# Patient Record
Sex: Male | Born: 1945
Health system: Southern US, Community
[De-identification: ages and names within clinical notes are randomized; demographics above are authoritative.]

## PROBLEM LIST (undated history)

## (undated) DIAGNOSIS — E119 Type 2 diabetes mellitus without complications: Secondary | ICD-10-CM

## (undated) DIAGNOSIS — M1990 Unspecified osteoarthritis, unspecified site: Secondary | ICD-10-CM

## (undated) DIAGNOSIS — I10 Essential (primary) hypertension: Secondary | ICD-10-CM

## (undated) DIAGNOSIS — Z87442 Personal history of urinary calculi: Secondary | ICD-10-CM

## (undated) DIAGNOSIS — K219 Gastro-esophageal reflux disease without esophagitis: Secondary | ICD-10-CM

## (undated) HISTORY — PX: TONSILLECTOMY: SUR1361

## (undated) HISTORY — PX: CHOLECYSTECTOMY: SHX55

## (undated) HISTORY — PX: BACK SURGERY: SHX140

## (undated) HISTORY — PX: JOINT REPLACEMENT: SHX530

## (undated) HISTORY — PX: EYE SURGERY: SHX253

---

## 2004-04-30 DIAGNOSIS — K219 Gastro-esophageal reflux disease without esophagitis: Secondary | ICD-10-CM | POA: Insufficient documentation

## 2005-10-24 DIAGNOSIS — D126 Benign neoplasm of colon, unspecified: Secondary | ICD-10-CM | POA: Insufficient documentation

## 2011-06-25 DIAGNOSIS — E119 Type 2 diabetes mellitus without complications: Secondary | ICD-10-CM | POA: Diagnosis not present

## 2011-06-25 DIAGNOSIS — M2040 Other hammer toe(s) (acquired), unspecified foot: Secondary | ICD-10-CM | POA: Diagnosis not present

## 2011-06-25 DIAGNOSIS — M2010 Hallux valgus (acquired), unspecified foot: Secondary | ICD-10-CM | POA: Diagnosis not present

## 2011-06-25 DIAGNOSIS — L600 Ingrowing nail: Secondary | ICD-10-CM | POA: Diagnosis not present

## 2011-06-25 DIAGNOSIS — M79609 Pain in unspecified limb: Secondary | ICD-10-CM | POA: Diagnosis not present

## 2011-07-09 DIAGNOSIS — E119 Type 2 diabetes mellitus without complications: Secondary | ICD-10-CM | POA: Diagnosis not present

## 2011-07-09 DIAGNOSIS — M7730 Calcaneal spur, unspecified foot: Secondary | ICD-10-CM | POA: Diagnosis not present

## 2011-07-09 DIAGNOSIS — M2010 Hallux valgus (acquired), unspecified foot: Secondary | ICD-10-CM | POA: Diagnosis not present

## 2011-07-09 DIAGNOSIS — M2040 Other hammer toe(s) (acquired), unspecified foot: Secondary | ICD-10-CM | POA: Diagnosis not present

## 2012-08-04 DIAGNOSIS — R498 Other voice and resonance disorders: Secondary | ICD-10-CM | POA: Diagnosis not present

## 2012-08-04 DIAGNOSIS — J370 Chronic laryngitis: Secondary | ICD-10-CM | POA: Diagnosis not present

## 2012-12-22 DIAGNOSIS — M19049 Primary osteoarthritis, unspecified hand: Secondary | ICD-10-CM | POA: Diagnosis not present

## 2012-12-22 DIAGNOSIS — M545 Low back pain: Secondary | ICD-10-CM | POA: Diagnosis not present

## 2012-12-22 DIAGNOSIS — M533 Sacrococcygeal disorders, not elsewhere classified: Secondary | ICD-10-CM | POA: Diagnosis not present

## 2012-12-22 DIAGNOSIS — Z0000 Encounter for general adult medical examination without abnormal findings: Secondary | ICD-10-CM | POA: Diagnosis not present

## 2012-12-25 DIAGNOSIS — M6281 Muscle weakness (generalized): Secondary | ICD-10-CM | POA: Diagnosis not present

## 2012-12-25 DIAGNOSIS — M545 Low back pain: Secondary | ICD-10-CM | POA: Diagnosis not present

## 2012-12-29 DIAGNOSIS — M6281 Muscle weakness (generalized): Secondary | ICD-10-CM | POA: Diagnosis not present

## 2012-12-29 DIAGNOSIS — M545 Low back pain: Secondary | ICD-10-CM | POA: Diagnosis not present

## 2012-12-31 DIAGNOSIS — M6281 Muscle weakness (generalized): Secondary | ICD-10-CM | POA: Diagnosis not present

## 2012-12-31 DIAGNOSIS — M545 Low back pain: Secondary | ICD-10-CM | POA: Diagnosis not present

## 2013-01-01 DIAGNOSIS — R42 Dizziness and giddiness: Secondary | ICD-10-CM | POA: Diagnosis not present

## 2013-01-01 DIAGNOSIS — R498 Other voice and resonance disorders: Secondary | ICD-10-CM | POA: Diagnosis not present

## 2013-01-06 DIAGNOSIS — M6281 Muscle weakness (generalized): Secondary | ICD-10-CM | POA: Diagnosis not present

## 2013-01-06 DIAGNOSIS — M545 Low back pain: Secondary | ICD-10-CM | POA: Diagnosis not present

## 2013-01-12 DIAGNOSIS — M545 Low back pain: Secondary | ICD-10-CM | POA: Diagnosis not present

## 2013-01-12 DIAGNOSIS — M6281 Muscle weakness (generalized): Secondary | ICD-10-CM | POA: Diagnosis not present

## 2013-01-15 DIAGNOSIS — Z006 Encounter for examination for normal comparison and control in clinical research program: Secondary | ICD-10-CM | POA: Diagnosis not present

## 2013-01-15 DIAGNOSIS — R937 Abnormal findings on diagnostic imaging of other parts of musculoskeletal system: Secondary | ICD-10-CM | POA: Diagnosis not present

## 2013-01-15 DIAGNOSIS — M533 Sacrococcygeal disorders, not elsewhere classified: Secondary | ICD-10-CM | POA: Diagnosis not present

## 2013-01-15 DIAGNOSIS — S20219A Contusion of unspecified front wall of thorax, initial encounter: Secondary | ICD-10-CM | POA: Diagnosis not present

## 2013-01-15 DIAGNOSIS — M545 Low back pain: Secondary | ICD-10-CM | POA: Diagnosis not present

## 2013-01-15 DIAGNOSIS — M5137 Other intervertebral disc degeneration, lumbosacral region: Secondary | ICD-10-CM | POA: Diagnosis not present

## 2013-01-27 DIAGNOSIS — M545 Low back pain: Secondary | ICD-10-CM | POA: Diagnosis not present

## 2013-01-27 DIAGNOSIS — M6281 Muscle weakness (generalized): Secondary | ICD-10-CM | POA: Diagnosis not present

## 2013-01-29 DIAGNOSIS — M545 Low back pain: Secondary | ICD-10-CM | POA: Diagnosis not present

## 2013-01-29 DIAGNOSIS — M6281 Muscle weakness (generalized): Secondary | ICD-10-CM | POA: Diagnosis not present

## 2013-02-02 DIAGNOSIS — M6281 Muscle weakness (generalized): Secondary | ICD-10-CM | POA: Diagnosis not present

## 2013-02-02 DIAGNOSIS — M545 Low back pain: Secondary | ICD-10-CM | POA: Diagnosis not present

## 2013-02-04 DIAGNOSIS — M6281 Muscle weakness (generalized): Secondary | ICD-10-CM | POA: Diagnosis not present

## 2013-02-04 DIAGNOSIS — M545 Low back pain: Secondary | ICD-10-CM | POA: Diagnosis not present

## 2013-02-09 DIAGNOSIS — M6281 Muscle weakness (generalized): Secondary | ICD-10-CM | POA: Diagnosis not present

## 2013-02-09 DIAGNOSIS — M545 Low back pain: Secondary | ICD-10-CM | POA: Diagnosis not present

## 2013-02-12 DIAGNOSIS — M545 Low back pain: Secondary | ICD-10-CM | POA: Diagnosis not present

## 2013-02-12 DIAGNOSIS — M6281 Muscle weakness (generalized): Secondary | ICD-10-CM | POA: Diagnosis not present

## 2013-02-16 DIAGNOSIS — M6281 Muscle weakness (generalized): Secondary | ICD-10-CM | POA: Diagnosis not present

## 2013-02-16 DIAGNOSIS — M545 Low back pain: Secondary | ICD-10-CM | POA: Diagnosis not present

## 2013-02-19 DIAGNOSIS — M545 Low back pain: Secondary | ICD-10-CM | POA: Diagnosis not present

## 2013-02-19 DIAGNOSIS — M6281 Muscle weakness (generalized): Secondary | ICD-10-CM | POA: Diagnosis not present

## 2013-03-10 DIAGNOSIS — M6281 Muscle weakness (generalized): Secondary | ICD-10-CM | POA: Diagnosis not present

## 2013-03-10 DIAGNOSIS — M545 Low back pain: Secondary | ICD-10-CM | POA: Diagnosis not present

## 2013-03-12 DIAGNOSIS — M6281 Muscle weakness (generalized): Secondary | ICD-10-CM | POA: Diagnosis not present

## 2013-03-12 DIAGNOSIS — M545 Low back pain: Secondary | ICD-10-CM | POA: Diagnosis not present

## 2013-03-16 DIAGNOSIS — M545 Low back pain: Secondary | ICD-10-CM | POA: Diagnosis not present

## 2013-03-16 DIAGNOSIS — M6281 Muscle weakness (generalized): Secondary | ICD-10-CM | POA: Diagnosis not present

## 2013-03-19 DIAGNOSIS — M6281 Muscle weakness (generalized): Secondary | ICD-10-CM | POA: Diagnosis not present

## 2013-03-19 DIAGNOSIS — M545 Low back pain: Secondary | ICD-10-CM | POA: Diagnosis not present

## 2013-03-23 DIAGNOSIS — M545 Low back pain: Secondary | ICD-10-CM | POA: Diagnosis not present

## 2013-03-23 DIAGNOSIS — M6281 Muscle weakness (generalized): Secondary | ICD-10-CM | POA: Diagnosis not present

## 2013-03-25 DIAGNOSIS — M6281 Muscle weakness (generalized): Secondary | ICD-10-CM | POA: Diagnosis not present

## 2013-03-25 DIAGNOSIS — M545 Low back pain: Secondary | ICD-10-CM | POA: Diagnosis not present

## 2013-03-31 DIAGNOSIS — M545 Low back pain: Secondary | ICD-10-CM | POA: Diagnosis not present

## 2013-03-31 DIAGNOSIS — M6281 Muscle weakness (generalized): Secondary | ICD-10-CM | POA: Diagnosis not present

## 2013-04-14 DIAGNOSIS — M1610 Unilateral primary osteoarthritis, unspecified hip: Secondary | ICD-10-CM | POA: Diagnosis not present

## 2013-04-21 DIAGNOSIS — M25559 Pain in unspecified hip: Secondary | ICD-10-CM | POA: Diagnosis not present

## 2013-05-26 DIAGNOSIS — M1610 Unilateral primary osteoarthritis, unspecified hip: Secondary | ICD-10-CM | POA: Diagnosis not present

## 2013-07-22 DIAGNOSIS — S0530XA Ocular laceration without prolapse or loss of intraocular tissue, unspecified eye, initial encounter: Secondary | ICD-10-CM | POA: Insufficient documentation

## 2013-07-22 DIAGNOSIS — S0590XA Unspecified injury of unspecified eye and orbit, initial encounter: Secondary | ICD-10-CM

## 2013-07-22 DIAGNOSIS — H25819 Combined forms of age-related cataract, unspecified eye: Secondary | ICD-10-CM

## 2013-07-22 DIAGNOSIS — H52219 Irregular astigmatism, unspecified eye: Secondary | ICD-10-CM

## 2013-07-22 HISTORY — DX: Combined forms of age-related cataract, unspecified eye: H25.819

## 2013-07-22 HISTORY — DX: Ocular laceration without prolapse or loss of intraocular tissue, unspecified eye, initial encounter: S05.30XA

## 2013-07-22 HISTORY — DX: Unspecified injury of unspecified eye and orbit, initial encounter: S05.90XA

## 2013-07-22 HISTORY — DX: Irregular astigmatism, unspecified eye: H52.219

## 2013-10-09 DIAGNOSIS — H1830 Unspecified corneal membrane change: Secondary | ICD-10-CM

## 2013-10-09 DIAGNOSIS — Z961 Presence of intraocular lens: Secondary | ICD-10-CM | POA: Insufficient documentation

## 2013-10-09 HISTORY — DX: Unspecified corneal membrane change: H18.30

## 2013-10-09 HISTORY — DX: Presence of intraocular lens: Z96.1

## 2014-02-08 DIAGNOSIS — M25551 Pain in right hip: Secondary | ICD-10-CM | POA: Diagnosis not present

## 2014-02-08 DIAGNOSIS — M1611 Unilateral primary osteoarthritis, right hip: Secondary | ICD-10-CM | POA: Diagnosis not present

## 2014-03-22 DIAGNOSIS — M25551 Pain in right hip: Secondary | ICD-10-CM | POA: Diagnosis not present

## 2014-03-22 DIAGNOSIS — M1611 Unilateral primary osteoarthritis, right hip: Secondary | ICD-10-CM | POA: Diagnosis not present

## 2014-08-10 DIAGNOSIS — M1611 Unilateral primary osteoarthritis, right hip: Secondary | ICD-10-CM | POA: Diagnosis not present

## 2014-08-11 DIAGNOSIS — Z79899 Other long term (current) drug therapy: Secondary | ICD-10-CM | POA: Diagnosis not present

## 2014-08-11 DIAGNOSIS — Z01810 Encounter for preprocedural cardiovascular examination: Secondary | ICD-10-CM | POA: Diagnosis not present

## 2014-08-11 DIAGNOSIS — M79609 Pain in unspecified limb: Secondary | ICD-10-CM | POA: Diagnosis not present

## 2014-08-11 DIAGNOSIS — Z0389 Encounter for observation for other suspected diseases and conditions ruled out: Secondary | ICD-10-CM | POA: Diagnosis not present

## 2014-08-11 DIAGNOSIS — Z01818 Encounter for other preprocedural examination: Secondary | ICD-10-CM | POA: Diagnosis not present

## 2014-08-30 DIAGNOSIS — M1611 Unilateral primary osteoarthritis, right hip: Secondary | ICD-10-CM | POA: Diagnosis not present

## 2014-09-01 DIAGNOSIS — I10 Essential (primary) hypertension: Secondary | ICD-10-CM | POA: Diagnosis not present

## 2014-09-01 DIAGNOSIS — Z471 Aftercare following joint replacement surgery: Secondary | ICD-10-CM | POA: Diagnosis not present

## 2014-09-01 DIAGNOSIS — M1611 Unilateral primary osteoarthritis, right hip: Secondary | ICD-10-CM | POA: Diagnosis not present

## 2014-09-01 DIAGNOSIS — E119 Type 2 diabetes mellitus without complications: Secondary | ICD-10-CM | POA: Diagnosis not present

## 2014-09-01 DIAGNOSIS — Z96641 Presence of right artificial hip joint: Secondary | ICD-10-CM | POA: Diagnosis not present

## 2014-09-02 DIAGNOSIS — I9589 Other hypotension: Secondary | ICD-10-CM | POA: Diagnosis not present

## 2014-09-02 DIAGNOSIS — R34 Anuria and oliguria: Secondary | ICD-10-CM | POA: Diagnosis not present

## 2014-09-02 DIAGNOSIS — N179 Acute kidney failure, unspecified: Secondary | ICD-10-CM | POA: Diagnosis not present

## 2014-09-02 DIAGNOSIS — D62 Acute posthemorrhagic anemia: Secondary | ICD-10-CM | POA: Diagnosis not present

## 2014-09-03 DIAGNOSIS — I9589 Other hypotension: Secondary | ICD-10-CM | POA: Diagnosis not present

## 2014-09-03 DIAGNOSIS — R34 Anuria and oliguria: Secondary | ICD-10-CM | POA: Diagnosis not present

## 2014-09-03 DIAGNOSIS — N179 Acute kidney failure, unspecified: Secondary | ICD-10-CM | POA: Diagnosis not present

## 2014-09-03 DIAGNOSIS — D62 Acute posthemorrhagic anemia: Secondary | ICD-10-CM | POA: Diagnosis not present

## 2014-09-05 DIAGNOSIS — Z96641 Presence of right artificial hip joint: Secondary | ICD-10-CM | POA: Diagnosis not present

## 2014-09-05 DIAGNOSIS — E119 Type 2 diabetes mellitus without complications: Secondary | ICD-10-CM | POA: Diagnosis not present

## 2014-09-05 DIAGNOSIS — Z471 Aftercare following joint replacement surgery: Secondary | ICD-10-CM | POA: Diagnosis not present

## 2014-09-07 DIAGNOSIS — E119 Type 2 diabetes mellitus without complications: Secondary | ICD-10-CM | POA: Diagnosis not present

## 2014-09-07 DIAGNOSIS — Z471 Aftercare following joint replacement surgery: Secondary | ICD-10-CM | POA: Diagnosis not present

## 2014-09-07 DIAGNOSIS — Z96641 Presence of right artificial hip joint: Secondary | ICD-10-CM | POA: Diagnosis not present

## 2014-09-08 DIAGNOSIS — E119 Type 2 diabetes mellitus without complications: Secondary | ICD-10-CM | POA: Diagnosis not present

## 2014-09-08 DIAGNOSIS — Z96641 Presence of right artificial hip joint: Secondary | ICD-10-CM | POA: Diagnosis not present

## 2014-09-08 DIAGNOSIS — Z471 Aftercare following joint replacement surgery: Secondary | ICD-10-CM | POA: Diagnosis not present

## 2014-09-10 DIAGNOSIS — Z96641 Presence of right artificial hip joint: Secondary | ICD-10-CM | POA: Diagnosis not present

## 2014-09-10 DIAGNOSIS — E119 Type 2 diabetes mellitus without complications: Secondary | ICD-10-CM | POA: Diagnosis not present

## 2014-09-10 DIAGNOSIS — Z471 Aftercare following joint replacement surgery: Secondary | ICD-10-CM | POA: Diagnosis not present

## 2014-09-12 DIAGNOSIS — E119 Type 2 diabetes mellitus without complications: Secondary | ICD-10-CM | POA: Diagnosis not present

## 2014-09-12 DIAGNOSIS — Z96641 Presence of right artificial hip joint: Secondary | ICD-10-CM | POA: Diagnosis not present

## 2014-09-12 DIAGNOSIS — Z471 Aftercare following joint replacement surgery: Secondary | ICD-10-CM | POA: Diagnosis not present

## 2014-10-15 DIAGNOSIS — Z96641 Presence of right artificial hip joint: Secondary | ICD-10-CM | POA: Diagnosis not present

## 2014-11-29 DIAGNOSIS — Z96641 Presence of right artificial hip joint: Secondary | ICD-10-CM | POA: Diagnosis not present

## 2014-12-03 DIAGNOSIS — Z96641 Presence of right artificial hip joint: Secondary | ICD-10-CM | POA: Diagnosis not present

## 2014-12-03 DIAGNOSIS — M6281 Muscle weakness (generalized): Secondary | ICD-10-CM | POA: Diagnosis not present

## 2014-12-03 DIAGNOSIS — R2689 Other abnormalities of gait and mobility: Secondary | ICD-10-CM | POA: Diagnosis not present

## 2014-12-03 DIAGNOSIS — M25551 Pain in right hip: Secondary | ICD-10-CM | POA: Diagnosis not present

## 2014-12-10 DIAGNOSIS — Z96641 Presence of right artificial hip joint: Secondary | ICD-10-CM | POA: Diagnosis not present

## 2014-12-10 DIAGNOSIS — R2689 Other abnormalities of gait and mobility: Secondary | ICD-10-CM | POA: Diagnosis not present

## 2014-12-10 DIAGNOSIS — M6281 Muscle weakness (generalized): Secondary | ICD-10-CM | POA: Diagnosis not present

## 2014-12-10 DIAGNOSIS — M25551 Pain in right hip: Secondary | ICD-10-CM | POA: Diagnosis not present

## 2014-12-14 DIAGNOSIS — R2689 Other abnormalities of gait and mobility: Secondary | ICD-10-CM | POA: Diagnosis not present

## 2014-12-14 DIAGNOSIS — M25551 Pain in right hip: Secondary | ICD-10-CM | POA: Diagnosis not present

## 2014-12-14 DIAGNOSIS — M6281 Muscle weakness (generalized): Secondary | ICD-10-CM | POA: Diagnosis not present

## 2014-12-14 DIAGNOSIS — Z96641 Presence of right artificial hip joint: Secondary | ICD-10-CM | POA: Diagnosis not present

## 2014-12-17 DIAGNOSIS — M25551 Pain in right hip: Secondary | ICD-10-CM | POA: Diagnosis not present

## 2014-12-17 DIAGNOSIS — R2689 Other abnormalities of gait and mobility: Secondary | ICD-10-CM | POA: Diagnosis not present

## 2014-12-17 DIAGNOSIS — M6281 Muscle weakness (generalized): Secondary | ICD-10-CM | POA: Diagnosis not present

## 2014-12-17 DIAGNOSIS — Z96641 Presence of right artificial hip joint: Secondary | ICD-10-CM | POA: Diagnosis not present

## 2014-12-21 DIAGNOSIS — R2689 Other abnormalities of gait and mobility: Secondary | ICD-10-CM | POA: Diagnosis not present

## 2014-12-21 DIAGNOSIS — M6281 Muscle weakness (generalized): Secondary | ICD-10-CM | POA: Diagnosis not present

## 2014-12-21 DIAGNOSIS — M25551 Pain in right hip: Secondary | ICD-10-CM | POA: Diagnosis not present

## 2014-12-21 DIAGNOSIS — Z96641 Presence of right artificial hip joint: Secondary | ICD-10-CM | POA: Diagnosis not present

## 2014-12-23 DIAGNOSIS — M25551 Pain in right hip: Secondary | ICD-10-CM | POA: Diagnosis not present

## 2014-12-23 DIAGNOSIS — R2689 Other abnormalities of gait and mobility: Secondary | ICD-10-CM | POA: Diagnosis not present

## 2014-12-23 DIAGNOSIS — Z96641 Presence of right artificial hip joint: Secondary | ICD-10-CM | POA: Diagnosis not present

## 2014-12-23 DIAGNOSIS — M6281 Muscle weakness (generalized): Secondary | ICD-10-CM | POA: Diagnosis not present

## 2014-12-28 DIAGNOSIS — M6281 Muscle weakness (generalized): Secondary | ICD-10-CM | POA: Diagnosis not present

## 2014-12-28 DIAGNOSIS — R2689 Other abnormalities of gait and mobility: Secondary | ICD-10-CM | POA: Diagnosis not present

## 2014-12-28 DIAGNOSIS — M25551 Pain in right hip: Secondary | ICD-10-CM | POA: Diagnosis not present

## 2014-12-30 DIAGNOSIS — Z96641 Presence of right artificial hip joint: Secondary | ICD-10-CM | POA: Diagnosis not present

## 2014-12-30 DIAGNOSIS — M6281 Muscle weakness (generalized): Secondary | ICD-10-CM | POA: Diagnosis not present

## 2014-12-30 DIAGNOSIS — R2689 Other abnormalities of gait and mobility: Secondary | ICD-10-CM | POA: Diagnosis not present

## 2014-12-30 DIAGNOSIS — M25551 Pain in right hip: Secondary | ICD-10-CM | POA: Diagnosis not present

## 2015-01-04 DIAGNOSIS — Z96641 Presence of right artificial hip joint: Secondary | ICD-10-CM | POA: Diagnosis not present

## 2015-01-04 DIAGNOSIS — R2689 Other abnormalities of gait and mobility: Secondary | ICD-10-CM | POA: Diagnosis not present

## 2015-01-04 DIAGNOSIS — M6281 Muscle weakness (generalized): Secondary | ICD-10-CM | POA: Diagnosis not present

## 2015-01-04 DIAGNOSIS — M25551 Pain in right hip: Secondary | ICD-10-CM | POA: Diagnosis not present

## 2015-01-07 DIAGNOSIS — M6281 Muscle weakness (generalized): Secondary | ICD-10-CM | POA: Diagnosis not present

## 2015-01-07 DIAGNOSIS — R2689 Other abnormalities of gait and mobility: Secondary | ICD-10-CM | POA: Diagnosis not present

## 2015-01-07 DIAGNOSIS — Z96641 Presence of right artificial hip joint: Secondary | ICD-10-CM | POA: Diagnosis not present

## 2015-01-07 DIAGNOSIS — M25551 Pain in right hip: Secondary | ICD-10-CM | POA: Diagnosis not present

## 2015-01-11 DIAGNOSIS — Z96641 Presence of right artificial hip joint: Secondary | ICD-10-CM | POA: Diagnosis not present

## 2015-01-11 DIAGNOSIS — R2689 Other abnormalities of gait and mobility: Secondary | ICD-10-CM | POA: Diagnosis not present

## 2015-01-11 DIAGNOSIS — M6281 Muscle weakness (generalized): Secondary | ICD-10-CM | POA: Diagnosis not present

## 2015-01-11 DIAGNOSIS — M25551 Pain in right hip: Secondary | ICD-10-CM | POA: Diagnosis not present

## 2015-01-13 DIAGNOSIS — M25551 Pain in right hip: Secondary | ICD-10-CM | POA: Diagnosis not present

## 2015-01-13 DIAGNOSIS — M6281 Muscle weakness (generalized): Secondary | ICD-10-CM | POA: Diagnosis not present

## 2015-01-13 DIAGNOSIS — Z96641 Presence of right artificial hip joint: Secondary | ICD-10-CM | POA: Diagnosis not present

## 2015-01-13 DIAGNOSIS — R2689 Other abnormalities of gait and mobility: Secondary | ICD-10-CM | POA: Diagnosis not present

## 2015-01-18 DIAGNOSIS — Z96641 Presence of right artificial hip joint: Secondary | ICD-10-CM | POA: Diagnosis not present

## 2015-01-18 DIAGNOSIS — M25551 Pain in right hip: Secondary | ICD-10-CM | POA: Diagnosis not present

## 2015-01-18 DIAGNOSIS — M6281 Muscle weakness (generalized): Secondary | ICD-10-CM | POA: Diagnosis not present

## 2015-01-18 DIAGNOSIS — R2689 Other abnormalities of gait and mobility: Secondary | ICD-10-CM | POA: Diagnosis not present

## 2015-01-20 DIAGNOSIS — Z96641 Presence of right artificial hip joint: Secondary | ICD-10-CM | POA: Diagnosis not present

## 2015-01-20 DIAGNOSIS — M6281 Muscle weakness (generalized): Secondary | ICD-10-CM | POA: Diagnosis not present

## 2015-01-20 DIAGNOSIS — M25551 Pain in right hip: Secondary | ICD-10-CM | POA: Diagnosis not present

## 2015-01-20 DIAGNOSIS — R2689 Other abnormalities of gait and mobility: Secondary | ICD-10-CM | POA: Diagnosis not present

## 2015-01-26 DIAGNOSIS — M25551 Pain in right hip: Secondary | ICD-10-CM | POA: Diagnosis not present

## 2015-01-26 DIAGNOSIS — M6281 Muscle weakness (generalized): Secondary | ICD-10-CM | POA: Diagnosis not present

## 2015-01-26 DIAGNOSIS — R2689 Other abnormalities of gait and mobility: Secondary | ICD-10-CM | POA: Diagnosis not present

## 2015-01-26 DIAGNOSIS — Z96641 Presence of right artificial hip joint: Secondary | ICD-10-CM | POA: Diagnosis not present

## 2015-01-28 DIAGNOSIS — M25551 Pain in right hip: Secondary | ICD-10-CM | POA: Diagnosis not present

## 2015-01-28 DIAGNOSIS — R2689 Other abnormalities of gait and mobility: Secondary | ICD-10-CM | POA: Diagnosis not present

## 2015-01-28 DIAGNOSIS — M6281 Muscle weakness (generalized): Secondary | ICD-10-CM | POA: Diagnosis not present

## 2015-01-28 DIAGNOSIS — Z96641 Presence of right artificial hip joint: Secondary | ICD-10-CM | POA: Diagnosis not present

## 2015-02-01 DIAGNOSIS — R2689 Other abnormalities of gait and mobility: Secondary | ICD-10-CM | POA: Diagnosis not present

## 2015-02-01 DIAGNOSIS — M6281 Muscle weakness (generalized): Secondary | ICD-10-CM | POA: Diagnosis not present

## 2015-02-01 DIAGNOSIS — Z96641 Presence of right artificial hip joint: Secondary | ICD-10-CM | POA: Diagnosis not present

## 2015-02-01 DIAGNOSIS — M25551 Pain in right hip: Secondary | ICD-10-CM | POA: Diagnosis not present

## 2015-02-03 DIAGNOSIS — Z96641 Presence of right artificial hip joint: Secondary | ICD-10-CM | POA: Diagnosis not present

## 2015-02-03 DIAGNOSIS — M1711 Unilateral primary osteoarthritis, right knee: Secondary | ICD-10-CM | POA: Diagnosis not present

## 2015-02-03 DIAGNOSIS — M6281 Muscle weakness (generalized): Secondary | ICD-10-CM | POA: Diagnosis not present

## 2015-02-03 DIAGNOSIS — M25551 Pain in right hip: Secondary | ICD-10-CM | POA: Diagnosis not present

## 2015-02-03 DIAGNOSIS — R2689 Other abnormalities of gait and mobility: Secondary | ICD-10-CM | POA: Diagnosis not present

## 2015-02-08 DIAGNOSIS — M25551 Pain in right hip: Secondary | ICD-10-CM | POA: Diagnosis not present

## 2015-02-08 DIAGNOSIS — Z96641 Presence of right artificial hip joint: Secondary | ICD-10-CM | POA: Diagnosis not present

## 2015-02-08 DIAGNOSIS — M6281 Muscle weakness (generalized): Secondary | ICD-10-CM | POA: Diagnosis not present

## 2015-02-08 DIAGNOSIS — R2689 Other abnormalities of gait and mobility: Secondary | ICD-10-CM | POA: Diagnosis not present

## 2015-02-10 DIAGNOSIS — M6281 Muscle weakness (generalized): Secondary | ICD-10-CM | POA: Diagnosis not present

## 2015-02-10 DIAGNOSIS — Z96641 Presence of right artificial hip joint: Secondary | ICD-10-CM | POA: Diagnosis not present

## 2015-02-10 DIAGNOSIS — R2689 Other abnormalities of gait and mobility: Secondary | ICD-10-CM | POA: Diagnosis not present

## 2015-02-10 DIAGNOSIS — M25551 Pain in right hip: Secondary | ICD-10-CM | POA: Diagnosis not present

## 2015-02-15 DIAGNOSIS — M6281 Muscle weakness (generalized): Secondary | ICD-10-CM | POA: Diagnosis not present

## 2015-02-15 DIAGNOSIS — M25551 Pain in right hip: Secondary | ICD-10-CM | POA: Diagnosis not present

## 2015-02-15 DIAGNOSIS — R2689 Other abnormalities of gait and mobility: Secondary | ICD-10-CM | POA: Diagnosis not present

## 2015-02-15 DIAGNOSIS — Z96641 Presence of right artificial hip joint: Secondary | ICD-10-CM | POA: Diagnosis not present

## 2015-02-17 DIAGNOSIS — M6281 Muscle weakness (generalized): Secondary | ICD-10-CM | POA: Diagnosis not present

## 2015-02-17 DIAGNOSIS — R2689 Other abnormalities of gait and mobility: Secondary | ICD-10-CM | POA: Diagnosis not present

## 2015-02-17 DIAGNOSIS — M25551 Pain in right hip: Secondary | ICD-10-CM | POA: Diagnosis not present

## 2015-02-17 DIAGNOSIS — Z96641 Presence of right artificial hip joint: Secondary | ICD-10-CM | POA: Diagnosis not present

## 2015-03-10 DIAGNOSIS — Z96641 Presence of right artificial hip joint: Secondary | ICD-10-CM | POA: Diagnosis not present

## 2015-04-12 DIAGNOSIS — M1711 Unilateral primary osteoarthritis, right knee: Secondary | ICD-10-CM | POA: Diagnosis not present

## 2015-04-18 DIAGNOSIS — M25561 Pain in right knee: Secondary | ICD-10-CM | POA: Diagnosis not present

## 2015-04-18 DIAGNOSIS — M5441 Lumbago with sciatica, right side: Secondary | ICD-10-CM | POA: Diagnosis not present

## 2015-04-27 DIAGNOSIS — M5441 Lumbago with sciatica, right side: Secondary | ICD-10-CM | POA: Diagnosis not present

## 2015-04-27 DIAGNOSIS — M25561 Pain in right knee: Secondary | ICD-10-CM | POA: Diagnosis not present

## 2015-05-03 DIAGNOSIS — M25561 Pain in right knee: Secondary | ICD-10-CM | POA: Diagnosis not present

## 2015-05-03 DIAGNOSIS — M5441 Lumbago with sciatica, right side: Secondary | ICD-10-CM | POA: Diagnosis not present

## 2015-05-06 DIAGNOSIS — M1711 Unilateral primary osteoarthritis, right knee: Secondary | ICD-10-CM | POA: Diagnosis not present

## 2015-05-11 DIAGNOSIS — M5441 Lumbago with sciatica, right side: Secondary | ICD-10-CM | POA: Diagnosis not present

## 2015-05-11 DIAGNOSIS — M25561 Pain in right knee: Secondary | ICD-10-CM | POA: Diagnosis not present

## 2015-05-16 DIAGNOSIS — M25561 Pain in right knee: Secondary | ICD-10-CM | POA: Diagnosis not present

## 2015-05-16 DIAGNOSIS — M5441 Lumbago with sciatica, right side: Secondary | ICD-10-CM | POA: Diagnosis not present

## 2015-05-30 DIAGNOSIS — H6120 Impacted cerumen, unspecified ear: Secondary | ICD-10-CM | POA: Diagnosis not present

## 2015-05-30 DIAGNOSIS — I10 Essential (primary) hypertension: Secondary | ICD-10-CM | POA: Diagnosis not present

## 2015-05-30 DIAGNOSIS — R490 Dysphonia: Secondary | ICD-10-CM | POA: Diagnosis not present

## 2015-06-21 DIAGNOSIS — M1711 Unilateral primary osteoarthritis, right knee: Secondary | ICD-10-CM | POA: Diagnosis not present

## 2015-07-04 DIAGNOSIS — M1711 Unilateral primary osteoarthritis, right knee: Secondary | ICD-10-CM | POA: Diagnosis not present

## 2015-07-08 DIAGNOSIS — Z01810 Encounter for preprocedural cardiovascular examination: Secondary | ICD-10-CM | POA: Diagnosis not present

## 2015-07-08 DIAGNOSIS — H9193 Unspecified hearing loss, bilateral: Secondary | ICD-10-CM | POA: Diagnosis not present

## 2015-07-08 DIAGNOSIS — Z79899 Other long term (current) drug therapy: Secondary | ICD-10-CM | POA: Diagnosis not present

## 2015-07-08 DIAGNOSIS — R490 Dysphonia: Secondary | ICD-10-CM | POA: Diagnosis not present

## 2015-07-08 DIAGNOSIS — M79609 Pain in unspecified limb: Secondary | ICD-10-CM | POA: Diagnosis not present

## 2015-07-08 DIAGNOSIS — Z01818 Encounter for other preprocedural examination: Secondary | ICD-10-CM | POA: Diagnosis not present

## 2015-08-01 DIAGNOSIS — J019 Acute sinusitis, unspecified: Secondary | ICD-10-CM | POA: Diagnosis not present

## 2015-08-25 DIAGNOSIS — Z01818 Encounter for other preprocedural examination: Secondary | ICD-10-CM | POA: Diagnosis not present

## 2015-08-25 DIAGNOSIS — M79609 Pain in unspecified limb: Secondary | ICD-10-CM | POA: Diagnosis not present

## 2015-08-25 DIAGNOSIS — Z79899 Other long term (current) drug therapy: Secondary | ICD-10-CM | POA: Diagnosis not present

## 2015-08-25 DIAGNOSIS — R52 Pain, unspecified: Secondary | ICD-10-CM | POA: Diagnosis not present

## 2015-10-28 DIAGNOSIS — M79609 Pain in unspecified limb: Secondary | ICD-10-CM | POA: Diagnosis not present

## 2015-10-28 DIAGNOSIS — M1711 Unilateral primary osteoarthritis, right knee: Secondary | ICD-10-CM | POA: Diagnosis not present

## 2015-10-28 DIAGNOSIS — Z01818 Encounter for other preprocedural examination: Secondary | ICD-10-CM | POA: Diagnosis not present

## 2015-10-28 DIAGNOSIS — Z79899 Other long term (current) drug therapy: Secondary | ICD-10-CM | POA: Diagnosis not present

## 2015-10-28 DIAGNOSIS — R52 Pain, unspecified: Secondary | ICD-10-CM | POA: Diagnosis not present

## 2015-11-09 DIAGNOSIS — K219 Gastro-esophageal reflux disease without esophagitis: Secondary | ICD-10-CM | POA: Diagnosis present

## 2015-11-09 DIAGNOSIS — Z9109 Other allergy status, other than to drugs and biological substances: Secondary | ICD-10-CM | POA: Diagnosis not present

## 2015-11-09 DIAGNOSIS — R509 Fever, unspecified: Secondary | ICD-10-CM | POA: Diagnosis not present

## 2015-11-09 DIAGNOSIS — Z471 Aftercare following joint replacement surgery: Secondary | ICD-10-CM | POA: Diagnosis not present

## 2015-11-09 DIAGNOSIS — Z96651 Presence of right artificial knee joint: Secondary | ICD-10-CM | POA: Diagnosis not present

## 2015-11-09 DIAGNOSIS — Z7982 Long term (current) use of aspirin: Secondary | ICD-10-CM | POA: Diagnosis not present

## 2015-11-09 DIAGNOSIS — G8918 Other acute postprocedural pain: Secondary | ICD-10-CM | POA: Diagnosis not present

## 2015-11-09 DIAGNOSIS — M1711 Unilateral primary osteoarthritis, right knee: Secondary | ICD-10-CM | POA: Diagnosis not present

## 2015-11-09 DIAGNOSIS — I10 Essential (primary) hypertension: Secondary | ICD-10-CM | POA: Diagnosis present

## 2015-11-09 DIAGNOSIS — E119 Type 2 diabetes mellitus without complications: Secondary | ICD-10-CM | POA: Diagnosis not present

## 2015-11-09 DIAGNOSIS — R490 Dysphonia: Secondary | ICD-10-CM | POA: Diagnosis present

## 2015-11-09 DIAGNOSIS — Z79899 Other long term (current) drug therapy: Secondary | ICD-10-CM | POA: Diagnosis not present

## 2015-11-09 DIAGNOSIS — R0602 Shortness of breath: Secondary | ICD-10-CM | POA: Diagnosis not present

## 2015-11-11 DIAGNOSIS — R509 Fever, unspecified: Secondary | ICD-10-CM | POA: Diagnosis not present

## 2015-11-11 DIAGNOSIS — R0602 Shortness of breath: Secondary | ICD-10-CM | POA: Diagnosis not present

## 2015-11-12 DIAGNOSIS — Z471 Aftercare following joint replacement surgery: Secondary | ICD-10-CM | POA: Diagnosis not present

## 2015-11-12 DIAGNOSIS — E785 Hyperlipidemia, unspecified: Secondary | ICD-10-CM | POA: Diagnosis not present

## 2015-11-12 DIAGNOSIS — M1990 Unspecified osteoarthritis, unspecified site: Secondary | ICD-10-CM | POA: Diagnosis not present

## 2015-11-12 DIAGNOSIS — I10 Essential (primary) hypertension: Secondary | ICD-10-CM | POA: Diagnosis not present

## 2015-11-12 DIAGNOSIS — E1140 Type 2 diabetes mellitus with diabetic neuropathy, unspecified: Secondary | ICD-10-CM | POA: Diagnosis not present

## 2015-11-12 DIAGNOSIS — E559 Vitamin D deficiency, unspecified: Secondary | ICD-10-CM | POA: Diagnosis not present

## 2015-11-14 DIAGNOSIS — E1140 Type 2 diabetes mellitus with diabetic neuropathy, unspecified: Secondary | ICD-10-CM | POA: Diagnosis not present

## 2015-11-14 DIAGNOSIS — Z471 Aftercare following joint replacement surgery: Secondary | ICD-10-CM | POA: Diagnosis not present

## 2015-11-14 DIAGNOSIS — E559 Vitamin D deficiency, unspecified: Secondary | ICD-10-CM | POA: Diagnosis not present

## 2015-11-14 DIAGNOSIS — I10 Essential (primary) hypertension: Secondary | ICD-10-CM | POA: Diagnosis not present

## 2015-11-14 DIAGNOSIS — M1990 Unspecified osteoarthritis, unspecified site: Secondary | ICD-10-CM | POA: Diagnosis not present

## 2015-11-14 DIAGNOSIS — E785 Hyperlipidemia, unspecified: Secondary | ICD-10-CM | POA: Diagnosis not present

## 2015-11-16 DIAGNOSIS — I10 Essential (primary) hypertension: Secondary | ICD-10-CM | POA: Diagnosis not present

## 2015-11-16 DIAGNOSIS — E1140 Type 2 diabetes mellitus with diabetic neuropathy, unspecified: Secondary | ICD-10-CM | POA: Diagnosis not present

## 2015-11-16 DIAGNOSIS — E785 Hyperlipidemia, unspecified: Secondary | ICD-10-CM | POA: Diagnosis not present

## 2015-11-16 DIAGNOSIS — Z471 Aftercare following joint replacement surgery: Secondary | ICD-10-CM | POA: Diagnosis not present

## 2015-11-16 DIAGNOSIS — E559 Vitamin D deficiency, unspecified: Secondary | ICD-10-CM | POA: Diagnosis not present

## 2015-11-16 DIAGNOSIS — M1990 Unspecified osteoarthritis, unspecified site: Secondary | ICD-10-CM | POA: Diagnosis not present

## 2015-11-18 DIAGNOSIS — I10 Essential (primary) hypertension: Secondary | ICD-10-CM | POA: Diagnosis not present

## 2015-11-18 DIAGNOSIS — E785 Hyperlipidemia, unspecified: Secondary | ICD-10-CM | POA: Diagnosis not present

## 2015-11-18 DIAGNOSIS — Z471 Aftercare following joint replacement surgery: Secondary | ICD-10-CM | POA: Diagnosis not present

## 2015-11-18 DIAGNOSIS — E1140 Type 2 diabetes mellitus with diabetic neuropathy, unspecified: Secondary | ICD-10-CM | POA: Diagnosis not present

## 2015-11-18 DIAGNOSIS — E559 Vitamin D deficiency, unspecified: Secondary | ICD-10-CM | POA: Diagnosis not present

## 2015-11-18 DIAGNOSIS — M1990 Unspecified osteoarthritis, unspecified site: Secondary | ICD-10-CM | POA: Diagnosis not present

## 2015-11-21 DIAGNOSIS — M1990 Unspecified osteoarthritis, unspecified site: Secondary | ICD-10-CM | POA: Diagnosis not present

## 2015-11-21 DIAGNOSIS — E1140 Type 2 diabetes mellitus with diabetic neuropathy, unspecified: Secondary | ICD-10-CM | POA: Diagnosis not present

## 2015-11-21 DIAGNOSIS — E559 Vitamin D deficiency, unspecified: Secondary | ICD-10-CM | POA: Diagnosis not present

## 2015-11-21 DIAGNOSIS — E785 Hyperlipidemia, unspecified: Secondary | ICD-10-CM | POA: Diagnosis not present

## 2015-11-21 DIAGNOSIS — Z471 Aftercare following joint replacement surgery: Secondary | ICD-10-CM | POA: Diagnosis not present

## 2015-11-21 DIAGNOSIS — I10 Essential (primary) hypertension: Secondary | ICD-10-CM | POA: Diagnosis not present

## 2015-11-23 DIAGNOSIS — E1140 Type 2 diabetes mellitus with diabetic neuropathy, unspecified: Secondary | ICD-10-CM | POA: Diagnosis not present

## 2015-11-23 DIAGNOSIS — M1990 Unspecified osteoarthritis, unspecified site: Secondary | ICD-10-CM | POA: Diagnosis not present

## 2015-11-23 DIAGNOSIS — Z471 Aftercare following joint replacement surgery: Secondary | ICD-10-CM | POA: Diagnosis not present

## 2015-11-23 DIAGNOSIS — E559 Vitamin D deficiency, unspecified: Secondary | ICD-10-CM | POA: Diagnosis not present

## 2015-11-23 DIAGNOSIS — I10 Essential (primary) hypertension: Secondary | ICD-10-CM | POA: Diagnosis not present

## 2015-11-23 DIAGNOSIS — E785 Hyperlipidemia, unspecified: Secondary | ICD-10-CM | POA: Diagnosis not present

## 2015-11-25 DIAGNOSIS — I10 Essential (primary) hypertension: Secondary | ICD-10-CM | POA: Diagnosis not present

## 2015-11-25 DIAGNOSIS — M1990 Unspecified osteoarthritis, unspecified site: Secondary | ICD-10-CM | POA: Diagnosis not present

## 2015-11-25 DIAGNOSIS — E785 Hyperlipidemia, unspecified: Secondary | ICD-10-CM | POA: Diagnosis not present

## 2015-11-25 DIAGNOSIS — Z471 Aftercare following joint replacement surgery: Secondary | ICD-10-CM | POA: Diagnosis not present

## 2015-11-25 DIAGNOSIS — E559 Vitamin D deficiency, unspecified: Secondary | ICD-10-CM | POA: Diagnosis not present

## 2015-11-25 DIAGNOSIS — E1140 Type 2 diabetes mellitus with diabetic neuropathy, unspecified: Secondary | ICD-10-CM | POA: Diagnosis not present

## 2015-11-28 DIAGNOSIS — M6281 Muscle weakness (generalized): Secondary | ICD-10-CM | POA: Diagnosis not present

## 2015-11-28 DIAGNOSIS — M1711 Unilateral primary osteoarthritis, right knee: Secondary | ICD-10-CM | POA: Diagnosis not present

## 2015-11-28 DIAGNOSIS — R2689 Other abnormalities of gait and mobility: Secondary | ICD-10-CM | POA: Diagnosis not present

## 2015-11-28 DIAGNOSIS — Z471 Aftercare following joint replacement surgery: Secondary | ICD-10-CM | POA: Diagnosis not present

## 2015-12-01 DIAGNOSIS — M1711 Unilateral primary osteoarthritis, right knee: Secondary | ICD-10-CM | POA: Diagnosis not present

## 2015-12-01 DIAGNOSIS — M6281 Muscle weakness (generalized): Secondary | ICD-10-CM | POA: Diagnosis not present

## 2015-12-01 DIAGNOSIS — Z471 Aftercare following joint replacement surgery: Secondary | ICD-10-CM | POA: Diagnosis not present

## 2015-12-01 DIAGNOSIS — R2689 Other abnormalities of gait and mobility: Secondary | ICD-10-CM | POA: Diagnosis not present

## 2015-12-05 DIAGNOSIS — R2689 Other abnormalities of gait and mobility: Secondary | ICD-10-CM | POA: Diagnosis not present

## 2015-12-05 DIAGNOSIS — M6281 Muscle weakness (generalized): Secondary | ICD-10-CM | POA: Diagnosis not present

## 2015-12-05 DIAGNOSIS — M1711 Unilateral primary osteoarthritis, right knee: Secondary | ICD-10-CM | POA: Diagnosis not present

## 2015-12-05 DIAGNOSIS — Z471 Aftercare following joint replacement surgery: Secondary | ICD-10-CM | POA: Diagnosis not present

## 2015-12-09 DIAGNOSIS — M1711 Unilateral primary osteoarthritis, right knee: Secondary | ICD-10-CM | POA: Diagnosis not present

## 2015-12-09 DIAGNOSIS — R2689 Other abnormalities of gait and mobility: Secondary | ICD-10-CM | POA: Diagnosis not present

## 2015-12-09 DIAGNOSIS — Z471 Aftercare following joint replacement surgery: Secondary | ICD-10-CM | POA: Diagnosis not present

## 2015-12-09 DIAGNOSIS — M6281 Muscle weakness (generalized): Secondary | ICD-10-CM | POA: Diagnosis not present

## 2015-12-12 DIAGNOSIS — R2689 Other abnormalities of gait and mobility: Secondary | ICD-10-CM | POA: Diagnosis not present

## 2015-12-12 DIAGNOSIS — M6281 Muscle weakness (generalized): Secondary | ICD-10-CM | POA: Diagnosis not present

## 2015-12-12 DIAGNOSIS — Z471 Aftercare following joint replacement surgery: Secondary | ICD-10-CM | POA: Diagnosis not present

## 2015-12-12 DIAGNOSIS — M1711 Unilateral primary osteoarthritis, right knee: Secondary | ICD-10-CM | POA: Diagnosis not present

## 2015-12-13 DIAGNOSIS — H6123 Impacted cerumen, bilateral: Secondary | ICD-10-CM | POA: Diagnosis not present

## 2015-12-15 DIAGNOSIS — R2689 Other abnormalities of gait and mobility: Secondary | ICD-10-CM | POA: Diagnosis not present

## 2015-12-15 DIAGNOSIS — M6281 Muscle weakness (generalized): Secondary | ICD-10-CM | POA: Diagnosis not present

## 2015-12-15 DIAGNOSIS — Z471 Aftercare following joint replacement surgery: Secondary | ICD-10-CM | POA: Diagnosis not present

## 2015-12-15 DIAGNOSIS — M1711 Unilateral primary osteoarthritis, right knee: Secondary | ICD-10-CM | POA: Diagnosis not present

## 2015-12-19 DIAGNOSIS — R2689 Other abnormalities of gait and mobility: Secondary | ICD-10-CM | POA: Diagnosis not present

## 2015-12-19 DIAGNOSIS — M1711 Unilateral primary osteoarthritis, right knee: Secondary | ICD-10-CM | POA: Diagnosis not present

## 2015-12-19 DIAGNOSIS — M6281 Muscle weakness (generalized): Secondary | ICD-10-CM | POA: Diagnosis not present

## 2015-12-19 DIAGNOSIS — Z471 Aftercare following joint replacement surgery: Secondary | ICD-10-CM | POA: Diagnosis not present

## 2015-12-20 DIAGNOSIS — Z96651 Presence of right artificial knee joint: Secondary | ICD-10-CM | POA: Diagnosis not present

## 2015-12-20 DIAGNOSIS — Z96659 Presence of unspecified artificial knee joint: Secondary | ICD-10-CM | POA: Diagnosis not present

## 2015-12-20 DIAGNOSIS — Z96641 Presence of right artificial hip joint: Secondary | ICD-10-CM | POA: Diagnosis not present

## 2015-12-20 DIAGNOSIS — M1611 Unilateral primary osteoarthritis, right hip: Secondary | ICD-10-CM | POA: Diagnosis not present

## 2015-12-21 DIAGNOSIS — M1711 Unilateral primary osteoarthritis, right knee: Secondary | ICD-10-CM | POA: Diagnosis not present

## 2015-12-21 DIAGNOSIS — Z471 Aftercare following joint replacement surgery: Secondary | ICD-10-CM | POA: Diagnosis not present

## 2015-12-21 DIAGNOSIS — M6281 Muscle weakness (generalized): Secondary | ICD-10-CM | POA: Diagnosis not present

## 2015-12-21 DIAGNOSIS — R2689 Other abnormalities of gait and mobility: Secondary | ICD-10-CM | POA: Diagnosis not present

## 2015-12-23 DIAGNOSIS — M25551 Pain in right hip: Secondary | ICD-10-CM | POA: Diagnosis not present

## 2015-12-23 DIAGNOSIS — Z96641 Presence of right artificial hip joint: Secondary | ICD-10-CM | POA: Diagnosis not present

## 2015-12-26 DIAGNOSIS — Z471 Aftercare following joint replacement surgery: Secondary | ICD-10-CM | POA: Diagnosis not present

## 2015-12-26 DIAGNOSIS — M6281 Muscle weakness (generalized): Secondary | ICD-10-CM | POA: Diagnosis not present

## 2015-12-26 DIAGNOSIS — R2689 Other abnormalities of gait and mobility: Secondary | ICD-10-CM | POA: Diagnosis not present

## 2015-12-26 DIAGNOSIS — M1711 Unilateral primary osteoarthritis, right knee: Secondary | ICD-10-CM | POA: Diagnosis not present

## 2015-12-27 ENCOUNTER — Other Ambulatory Visit: Payer: Self-pay

## 2015-12-29 DIAGNOSIS — Z471 Aftercare following joint replacement surgery: Secondary | ICD-10-CM | POA: Diagnosis not present

## 2015-12-29 DIAGNOSIS — R2689 Other abnormalities of gait and mobility: Secondary | ICD-10-CM | POA: Diagnosis not present

## 2015-12-29 DIAGNOSIS — M1711 Unilateral primary osteoarthritis, right knee: Secondary | ICD-10-CM | POA: Diagnosis not present

## 2015-12-29 DIAGNOSIS — M6281 Muscle weakness (generalized): Secondary | ICD-10-CM | POA: Diagnosis not present

## 2016-01-02 DIAGNOSIS — M1711 Unilateral primary osteoarthritis, right knee: Secondary | ICD-10-CM | POA: Diagnosis not present

## 2016-01-02 DIAGNOSIS — R2689 Other abnormalities of gait and mobility: Secondary | ICD-10-CM | POA: Diagnosis not present

## 2016-01-02 DIAGNOSIS — Z471 Aftercare following joint replacement surgery: Secondary | ICD-10-CM | POA: Diagnosis not present

## 2016-01-02 DIAGNOSIS — M6281 Muscle weakness (generalized): Secondary | ICD-10-CM | POA: Diagnosis not present

## 2016-01-05 DIAGNOSIS — Z471 Aftercare following joint replacement surgery: Secondary | ICD-10-CM | POA: Diagnosis not present

## 2016-01-05 DIAGNOSIS — M6281 Muscle weakness (generalized): Secondary | ICD-10-CM | POA: Diagnosis not present

## 2016-01-05 DIAGNOSIS — M1711 Unilateral primary osteoarthritis, right knee: Secondary | ICD-10-CM | POA: Diagnosis not present

## 2016-01-05 DIAGNOSIS — R2689 Other abnormalities of gait and mobility: Secondary | ICD-10-CM | POA: Diagnosis not present

## 2016-01-13 DIAGNOSIS — R1030 Lower abdominal pain, unspecified: Secondary | ICD-10-CM | POA: Diagnosis not present

## 2016-01-13 DIAGNOSIS — M6281 Muscle weakness (generalized): Secondary | ICD-10-CM | POA: Diagnosis not present

## 2016-01-13 DIAGNOSIS — Z96641 Presence of right artificial hip joint: Secondary | ICD-10-CM | POA: Diagnosis not present

## 2016-01-13 DIAGNOSIS — R2689 Other abnormalities of gait and mobility: Secondary | ICD-10-CM | POA: Diagnosis not present

## 2016-01-13 DIAGNOSIS — Z471 Aftercare following joint replacement surgery: Secondary | ICD-10-CM | POA: Diagnosis not present

## 2016-01-13 DIAGNOSIS — G894 Chronic pain syndrome: Secondary | ICD-10-CM | POA: Diagnosis not present

## 2016-01-13 DIAGNOSIS — Z96651 Presence of right artificial knee joint: Secondary | ICD-10-CM | POA: Diagnosis not present

## 2016-01-13 DIAGNOSIS — M1711 Unilateral primary osteoarthritis, right knee: Secondary | ICD-10-CM | POA: Diagnosis not present

## 2016-01-16 DIAGNOSIS — M6281 Muscle weakness (generalized): Secondary | ICD-10-CM | POA: Diagnosis not present

## 2016-01-16 DIAGNOSIS — R2689 Other abnormalities of gait and mobility: Secondary | ICD-10-CM | POA: Diagnosis not present

## 2016-01-16 DIAGNOSIS — M1711 Unilateral primary osteoarthritis, right knee: Secondary | ICD-10-CM | POA: Diagnosis not present

## 2016-01-16 DIAGNOSIS — Z471 Aftercare following joint replacement surgery: Secondary | ICD-10-CM | POA: Diagnosis not present

## 2016-01-19 DIAGNOSIS — Z471 Aftercare following joint replacement surgery: Secondary | ICD-10-CM | POA: Diagnosis not present

## 2016-01-19 DIAGNOSIS — M6281 Muscle weakness (generalized): Secondary | ICD-10-CM | POA: Diagnosis not present

## 2016-01-19 DIAGNOSIS — M1711 Unilateral primary osteoarthritis, right knee: Secondary | ICD-10-CM | POA: Diagnosis not present

## 2016-01-19 DIAGNOSIS — R2689 Other abnormalities of gait and mobility: Secondary | ICD-10-CM | POA: Diagnosis not present

## 2016-01-23 DIAGNOSIS — M1711 Unilateral primary osteoarthritis, right knee: Secondary | ICD-10-CM | POA: Diagnosis not present

## 2016-01-23 DIAGNOSIS — Z471 Aftercare following joint replacement surgery: Secondary | ICD-10-CM | POA: Diagnosis not present

## 2016-01-23 DIAGNOSIS — R2689 Other abnormalities of gait and mobility: Secondary | ICD-10-CM | POA: Diagnosis not present

## 2016-01-23 DIAGNOSIS — M6281 Muscle weakness (generalized): Secondary | ICD-10-CM | POA: Diagnosis not present

## 2016-01-24 DIAGNOSIS — M2578 Osteophyte, vertebrae: Secondary | ICD-10-CM | POA: Diagnosis not present

## 2016-01-24 DIAGNOSIS — M5137 Other intervertebral disc degeneration, lumbosacral region: Secondary | ICD-10-CM | POA: Diagnosis not present

## 2016-01-24 DIAGNOSIS — M5127 Other intervertebral disc displacement, lumbosacral region: Secondary | ICD-10-CM | POA: Diagnosis not present

## 2016-01-24 DIAGNOSIS — M5136 Other intervertebral disc degeneration, lumbar region: Secondary | ICD-10-CM | POA: Diagnosis not present

## 2016-01-24 DIAGNOSIS — M4807 Spinal stenosis, lumbosacral region: Secondary | ICD-10-CM | POA: Diagnosis not present

## 2016-01-24 DIAGNOSIS — M5126 Other intervertebral disc displacement, lumbar region: Secondary | ICD-10-CM | POA: Diagnosis not present

## 2016-01-26 DIAGNOSIS — M1711 Unilateral primary osteoarthritis, right knee: Secondary | ICD-10-CM | POA: Diagnosis not present

## 2016-01-26 DIAGNOSIS — R2689 Other abnormalities of gait and mobility: Secondary | ICD-10-CM | POA: Diagnosis not present

## 2016-01-26 DIAGNOSIS — M6281 Muscle weakness (generalized): Secondary | ICD-10-CM | POA: Diagnosis not present

## 2016-01-26 DIAGNOSIS — Z471 Aftercare following joint replacement surgery: Secondary | ICD-10-CM | POA: Diagnosis not present

## 2016-01-30 DIAGNOSIS — M6281 Muscle weakness (generalized): Secondary | ICD-10-CM | POA: Diagnosis not present

## 2016-01-30 DIAGNOSIS — M1711 Unilateral primary osteoarthritis, right knee: Secondary | ICD-10-CM | POA: Diagnosis not present

## 2016-01-30 DIAGNOSIS — Z471 Aftercare following joint replacement surgery: Secondary | ICD-10-CM | POA: Diagnosis not present

## 2016-01-30 DIAGNOSIS — R2689 Other abnormalities of gait and mobility: Secondary | ICD-10-CM | POA: Diagnosis not present

## 2016-01-31 DIAGNOSIS — Z96641 Presence of right artificial hip joint: Secondary | ICD-10-CM | POA: Diagnosis not present

## 2016-01-31 DIAGNOSIS — M25551 Pain in right hip: Secondary | ICD-10-CM | POA: Diagnosis not present

## 2016-02-02 DIAGNOSIS — Z471 Aftercare following joint replacement surgery: Secondary | ICD-10-CM | POA: Diagnosis not present

## 2016-02-02 DIAGNOSIS — M6281 Muscle weakness (generalized): Secondary | ICD-10-CM | POA: Diagnosis not present

## 2016-02-02 DIAGNOSIS — M1711 Unilateral primary osteoarthritis, right knee: Secondary | ICD-10-CM | POA: Diagnosis not present

## 2016-02-02 DIAGNOSIS — R2689 Other abnormalities of gait and mobility: Secondary | ICD-10-CM | POA: Diagnosis not present

## 2016-02-06 DIAGNOSIS — Z471 Aftercare following joint replacement surgery: Secondary | ICD-10-CM | POA: Diagnosis not present

## 2016-02-06 DIAGNOSIS — R2689 Other abnormalities of gait and mobility: Secondary | ICD-10-CM | POA: Diagnosis not present

## 2016-02-06 DIAGNOSIS — M6281 Muscle weakness (generalized): Secondary | ICD-10-CM | POA: Diagnosis not present

## 2016-02-06 DIAGNOSIS — M1711 Unilateral primary osteoarthritis, right knee: Secondary | ICD-10-CM | POA: Diagnosis not present

## 2016-02-07 DIAGNOSIS — G8929 Other chronic pain: Secondary | ICD-10-CM | POA: Diagnosis not present

## 2016-02-07 DIAGNOSIS — R1030 Lower abdominal pain, unspecified: Secondary | ICD-10-CM | POA: Diagnosis not present

## 2016-02-07 DIAGNOSIS — M545 Low back pain: Secondary | ICD-10-CM | POA: Diagnosis not present

## 2016-02-07 DIAGNOSIS — M5136 Other intervertebral disc degeneration, lumbar region: Secondary | ICD-10-CM | POA: Diagnosis not present

## 2016-02-07 DIAGNOSIS — R1031 Right lower quadrant pain: Secondary | ICD-10-CM | POA: Diagnosis not present

## 2016-02-07 DIAGNOSIS — M9983 Other biomechanical lesions of lumbar region: Secondary | ICD-10-CM | POA: Diagnosis not present

## 2016-02-07 DIAGNOSIS — G894 Chronic pain syndrome: Secondary | ICD-10-CM | POA: Diagnosis not present

## 2016-02-09 DIAGNOSIS — M1711 Unilateral primary osteoarthritis, right knee: Secondary | ICD-10-CM | POA: Diagnosis not present

## 2016-02-09 DIAGNOSIS — Z471 Aftercare following joint replacement surgery: Secondary | ICD-10-CM | POA: Diagnosis not present

## 2016-02-09 DIAGNOSIS — R2689 Other abnormalities of gait and mobility: Secondary | ICD-10-CM | POA: Diagnosis not present

## 2016-02-09 DIAGNOSIS — M6281 Muscle weakness (generalized): Secondary | ICD-10-CM | POA: Diagnosis not present

## 2016-02-13 DIAGNOSIS — R2689 Other abnormalities of gait and mobility: Secondary | ICD-10-CM | POA: Diagnosis not present

## 2016-02-13 DIAGNOSIS — M6281 Muscle weakness (generalized): Secondary | ICD-10-CM | POA: Diagnosis not present

## 2016-02-13 DIAGNOSIS — M1711 Unilateral primary osteoarthritis, right knee: Secondary | ICD-10-CM | POA: Diagnosis not present

## 2016-02-13 DIAGNOSIS — Z471 Aftercare following joint replacement surgery: Secondary | ICD-10-CM | POA: Diagnosis not present

## 2016-02-15 DIAGNOSIS — M5416 Radiculopathy, lumbar region: Secondary | ICD-10-CM

## 2016-02-15 HISTORY — DX: Radiculopathy, lumbar region: M54.16

## 2016-02-16 DIAGNOSIS — Z471 Aftercare following joint replacement surgery: Secondary | ICD-10-CM | POA: Diagnosis not present

## 2016-02-16 DIAGNOSIS — R2689 Other abnormalities of gait and mobility: Secondary | ICD-10-CM | POA: Diagnosis not present

## 2016-02-16 DIAGNOSIS — M6281 Muscle weakness (generalized): Secondary | ICD-10-CM | POA: Diagnosis not present

## 2016-02-16 DIAGNOSIS — M1711 Unilateral primary osteoarthritis, right knee: Secondary | ICD-10-CM | POA: Diagnosis not present

## 2016-02-17 DIAGNOSIS — R1030 Lower abdominal pain, unspecified: Secondary | ICD-10-CM | POA: Diagnosis not present

## 2016-02-17 DIAGNOSIS — Z79899 Other long term (current) drug therapy: Secondary | ICD-10-CM | POA: Diagnosis not present

## 2016-02-17 DIAGNOSIS — M5441 Lumbago with sciatica, right side: Secondary | ICD-10-CM | POA: Diagnosis not present

## 2016-02-17 DIAGNOSIS — I10 Essential (primary) hypertension: Secondary | ICD-10-CM | POA: Diagnosis not present

## 2016-02-17 DIAGNOSIS — Z7982 Long term (current) use of aspirin: Secondary | ICD-10-CM | POA: Diagnosis not present

## 2016-02-17 DIAGNOSIS — Z7984 Long term (current) use of oral hypoglycemic drugs: Secondary | ICD-10-CM | POA: Diagnosis not present

## 2016-02-17 DIAGNOSIS — K219 Gastro-esophageal reflux disease without esophagitis: Secondary | ICD-10-CM | POA: Diagnosis not present

## 2016-02-17 DIAGNOSIS — E119 Type 2 diabetes mellitus without complications: Secondary | ICD-10-CM | POA: Diagnosis not present

## 2016-02-17 DIAGNOSIS — G894 Chronic pain syndrome: Secondary | ICD-10-CM | POA: Diagnosis not present

## 2016-02-17 DIAGNOSIS — Z8249 Family history of ischemic heart disease and other diseases of the circulatory system: Secondary | ICD-10-CM | POA: Diagnosis not present

## 2016-02-17 DIAGNOSIS — M5416 Radiculopathy, lumbar region: Secondary | ICD-10-CM | POA: Diagnosis not present

## 2016-02-17 DIAGNOSIS — M5442 Lumbago with sciatica, left side: Secondary | ICD-10-CM | POA: Diagnosis not present

## 2016-02-17 DIAGNOSIS — Z833 Family history of diabetes mellitus: Secondary | ICD-10-CM | POA: Diagnosis not present

## 2016-03-07 DIAGNOSIS — M5416 Radiculopathy, lumbar region: Secondary | ICD-10-CM | POA: Diagnosis not present

## 2016-03-07 DIAGNOSIS — M48061 Spinal stenosis, lumbar region without neurogenic claudication: Secondary | ICD-10-CM | POA: Diagnosis not present

## 2016-03-07 DIAGNOSIS — M5136 Other intervertebral disc degeneration, lumbar region: Secondary | ICD-10-CM | POA: Diagnosis not present

## 2016-03-07 DIAGNOSIS — R1031 Right lower quadrant pain: Secondary | ICD-10-CM | POA: Diagnosis not present

## 2016-03-07 DIAGNOSIS — E1159 Type 2 diabetes mellitus with other circulatory complications: Secondary | ICD-10-CM | POA: Diagnosis not present

## 2016-03-07 DIAGNOSIS — M4800 Spinal stenosis, site unspecified: Secondary | ICD-10-CM | POA: Diagnosis not present

## 2016-03-07 DIAGNOSIS — M9983 Other biomechanical lesions of lumbar region: Secondary | ICD-10-CM | POA: Diagnosis not present

## 2016-04-06 DIAGNOSIS — M5136 Other intervertebral disc degeneration, lumbar region: Secondary | ICD-10-CM | POA: Diagnosis not present

## 2016-04-06 DIAGNOSIS — E119 Type 2 diabetes mellitus without complications: Secondary | ICD-10-CM | POA: Diagnosis not present

## 2016-04-06 DIAGNOSIS — Z7982 Long term (current) use of aspirin: Secondary | ICD-10-CM | POA: Diagnosis not present

## 2016-04-06 DIAGNOSIS — K219 Gastro-esophageal reflux disease without esophagitis: Secondary | ICD-10-CM | POA: Diagnosis not present

## 2016-04-06 DIAGNOSIS — Z7984 Long term (current) use of oral hypoglycemic drugs: Secondary | ICD-10-CM | POA: Diagnosis not present

## 2016-04-06 DIAGNOSIS — Z79899 Other long term (current) drug therapy: Secondary | ICD-10-CM | POA: Diagnosis not present

## 2016-04-06 DIAGNOSIS — I10 Essential (primary) hypertension: Secondary | ICD-10-CM | POA: Diagnosis not present

## 2016-04-06 DIAGNOSIS — M545 Low back pain: Secondary | ICD-10-CM | POA: Diagnosis not present

## 2016-04-06 DIAGNOSIS — M48061 Spinal stenosis, lumbar region without neurogenic claudication: Secondary | ICD-10-CM | POA: Diagnosis not present

## 2016-04-06 DIAGNOSIS — M5416 Radiculopathy, lumbar region: Secondary | ICD-10-CM | POA: Diagnosis not present

## 2016-04-19 DIAGNOSIS — Z7983 Long term (current) use of bisphosphonates: Secondary | ICD-10-CM | POA: Diagnosis not present

## 2016-04-19 DIAGNOSIS — E119 Type 2 diabetes mellitus without complications: Secondary | ICD-10-CM | POA: Diagnosis not present

## 2016-04-19 DIAGNOSIS — M9983 Other biomechanical lesions of lumbar region: Secondary | ICD-10-CM | POA: Diagnosis not present

## 2016-04-19 DIAGNOSIS — G894 Chronic pain syndrome: Secondary | ICD-10-CM | POA: Diagnosis not present

## 2016-04-19 DIAGNOSIS — K219 Gastro-esophageal reflux disease without esophagitis: Secondary | ICD-10-CM | POA: Diagnosis not present

## 2016-04-19 DIAGNOSIS — Z7982 Long term (current) use of aspirin: Secondary | ICD-10-CM | POA: Diagnosis not present

## 2016-04-19 DIAGNOSIS — M48061 Spinal stenosis, lumbar region without neurogenic claudication: Secondary | ICD-10-CM | POA: Diagnosis not present

## 2016-04-19 DIAGNOSIS — M5416 Radiculopathy, lumbar region: Secondary | ICD-10-CM | POA: Diagnosis not present

## 2016-04-19 DIAGNOSIS — Z7984 Long term (current) use of oral hypoglycemic drugs: Secondary | ICD-10-CM | POA: Diagnosis not present

## 2016-04-19 DIAGNOSIS — M4800 Spinal stenosis, site unspecified: Secondary | ICD-10-CM | POA: Diagnosis not present

## 2016-04-19 DIAGNOSIS — I10 Essential (primary) hypertension: Secondary | ICD-10-CM | POA: Diagnosis not present

## 2016-04-19 DIAGNOSIS — R1031 Right lower quadrant pain: Secondary | ICD-10-CM | POA: Diagnosis not present

## 2016-04-19 DIAGNOSIS — Z79899 Other long term (current) drug therapy: Secondary | ICD-10-CM | POA: Diagnosis not present

## 2016-05-03 DIAGNOSIS — M25561 Pain in right knee: Secondary | ICD-10-CM | POA: Diagnosis not present

## 2016-05-03 DIAGNOSIS — Z96651 Presence of right artificial knee joint: Secondary | ICD-10-CM | POA: Diagnosis not present

## 2016-05-09 DIAGNOSIS — M47816 Spondylosis without myelopathy or radiculopathy, lumbar region: Secondary | ICD-10-CM | POA: Diagnosis not present

## 2016-05-09 DIAGNOSIS — Z9889 Other specified postprocedural states: Secondary | ICD-10-CM | POA: Diagnosis not present

## 2016-05-09 DIAGNOSIS — M25551 Pain in right hip: Secondary | ICD-10-CM | POA: Diagnosis not present

## 2016-07-19 DIAGNOSIS — M25551 Pain in right hip: Secondary | ICD-10-CM | POA: Diagnosis not present

## 2016-07-19 DIAGNOSIS — Z96641 Presence of right artificial hip joint: Secondary | ICD-10-CM | POA: Diagnosis not present

## 2016-07-19 DIAGNOSIS — Z471 Aftercare following joint replacement surgery: Secondary | ICD-10-CM | POA: Diagnosis not present

## 2016-07-23 DIAGNOSIS — M25551 Pain in right hip: Secondary | ICD-10-CM | POA: Diagnosis not present

## 2016-07-30 ENCOUNTER — Other Ambulatory Visit: Payer: Self-pay | Admitting: Orthopedic Surgery

## 2016-07-30 DIAGNOSIS — M25551 Pain in right hip: Secondary | ICD-10-CM

## 2016-08-10 ENCOUNTER — Ambulatory Visit
Admission: RE | Admit: 2016-08-10 | Discharge: 2016-08-10 | Disposition: A | Discharge status: Home / Self Care | Payer: Medicare Other | Source: Ambulatory Visit | Attending: Orthopedic Surgery | Admitting: Orthopedic Surgery

## 2016-08-10 DIAGNOSIS — M25551 Pain in right hip: Secondary | ICD-10-CM

## 2016-08-30 DIAGNOSIS — M25551 Pain in right hip: Secondary | ICD-10-CM | POA: Diagnosis not present

## 2016-09-13 ENCOUNTER — Other Ambulatory Visit (HOSPITAL_COMMUNITY): Payer: Self-pay | Admitting: Orthopedic Surgery

## 2016-09-13 DIAGNOSIS — R9389 Abnormal findings on diagnostic imaging of other specified body structures: Secondary | ICD-10-CM

## 2016-09-18 ENCOUNTER — Ambulatory Visit (HOSPITAL_COMMUNITY)
Admission: RE | Admit: 2016-09-18 | Discharge: 2016-09-18 | Disposition: A | Discharge status: Home / Self Care | Payer: Medicare Other | Source: Ambulatory Visit | Attending: Orthopedic Surgery | Admitting: Orthopedic Surgery

## 2016-09-18 DIAGNOSIS — M25551 Pain in right hip: Secondary | ICD-10-CM | POA: Diagnosis not present

## 2016-09-18 DIAGNOSIS — R938 Abnormal findings on diagnostic imaging of other specified body structures: Secondary | ICD-10-CM | POA: Diagnosis not present

## 2016-09-18 DIAGNOSIS — R9389 Abnormal findings on diagnostic imaging of other specified body structures: Secondary | ICD-10-CM

## 2016-09-18 MED ORDER — LIDOCAINE HCL (PF) 1 % IJ SOLN
5.0000 mL | Freq: Once | INTRAMUSCULAR | Status: AC
  Administered 2016-09-18: 5 mL via INTRADERMAL

## 2016-09-18 NOTE — Procedures (Signed)
Interventional Radiology Procedure Note  Procedure: Right hip fluoro guided aspiration.  Right hip bursitis Findings: ~4cc of dark brown/reddish thin fluid aspirated in 2 aliquot Recommendations:  - Ok to shower tomorrow - Do not submerge for 7 days - Routine care - Labs per ortho office   Signed,  Yvone NeuJaime S. Loreta AveWagner, DO

## 2016-09-21 LAB — BODY FLUID CULTURE
CULTURE: NO GROWTH
GRAM STAIN: NONE SEEN

## 2016-09-23 LAB — ANAEROBIC CULTURE

## 2016-09-26 DIAGNOSIS — N3000 Acute cystitis without hematuria: Secondary | ICD-10-CM | POA: Diagnosis not present

## 2016-09-26 DIAGNOSIS — M545 Low back pain: Secondary | ICD-10-CM | POA: Diagnosis not present

## 2016-10-02 DIAGNOSIS — M2550 Pain in unspecified joint: Secondary | ICD-10-CM | POA: Diagnosis not present

## 2016-10-02 DIAGNOSIS — R350 Frequency of micturition: Secondary | ICD-10-CM | POA: Diagnosis not present

## 2016-10-02 DIAGNOSIS — Z1389 Encounter for screening for other disorder: Secondary | ICD-10-CM | POA: Diagnosis not present

## 2016-10-02 DIAGNOSIS — Z0000 Encounter for general adult medical examination without abnormal findings: Secondary | ICD-10-CM | POA: Diagnosis not present

## 2016-10-05 DIAGNOSIS — N400 Enlarged prostate without lower urinary tract symptoms: Secondary | ICD-10-CM | POA: Diagnosis not present

## 2016-10-05 DIAGNOSIS — Z1389 Encounter for screening for other disorder: Secondary | ICD-10-CM | POA: Diagnosis not present

## 2016-10-05 DIAGNOSIS — Z125 Encounter for screening for malignant neoplasm of prostate: Secondary | ICD-10-CM | POA: Diagnosis not present

## 2016-10-05 DIAGNOSIS — Z9181 History of falling: Secondary | ICD-10-CM | POA: Diagnosis not present

## 2016-10-23 DIAGNOSIS — Z1211 Encounter for screening for malignant neoplasm of colon: Secondary | ICD-10-CM | POA: Diagnosis not present

## 2016-10-24 DIAGNOSIS — M4807 Spinal stenosis, lumbosacral region: Secondary | ICD-10-CM | POA: Diagnosis not present

## 2016-10-24 DIAGNOSIS — M25551 Pain in right hip: Secondary | ICD-10-CM | POA: Diagnosis not present

## 2016-11-05 DIAGNOSIS — M4807 Spinal stenosis, lumbosacral region: Secondary | ICD-10-CM | POA: Diagnosis not present

## 2016-11-05 DIAGNOSIS — M5431 Sciatica, right side: Secondary | ICD-10-CM | POA: Diagnosis not present

## 2016-11-05 DIAGNOSIS — Z96641 Presence of right artificial hip joint: Secondary | ICD-10-CM | POA: Diagnosis not present

## 2016-11-05 DIAGNOSIS — M5136 Other intervertebral disc degeneration, lumbar region: Secondary | ICD-10-CM | POA: Diagnosis not present

## 2016-11-13 DIAGNOSIS — M25551 Pain in right hip: Secondary | ICD-10-CM | POA: Diagnosis not present

## 2016-11-13 DIAGNOSIS — M1711 Unilateral primary osteoarthritis, right knee: Secondary | ICD-10-CM | POA: Diagnosis not present

## 2016-11-13 DIAGNOSIS — Z96651 Presence of right artificial knee joint: Secondary | ICD-10-CM | POA: Diagnosis not present

## 2016-11-15 DIAGNOSIS — M25551 Pain in right hip: Secondary | ICD-10-CM | POA: Diagnosis not present

## 2016-11-15 DIAGNOSIS — M545 Low back pain: Secondary | ICD-10-CM | POA: Diagnosis not present

## 2016-11-20 DIAGNOSIS — M545 Low back pain: Secondary | ICD-10-CM | POA: Diagnosis not present

## 2016-11-20 DIAGNOSIS — M25551 Pain in right hip: Secondary | ICD-10-CM | POA: Diagnosis not present

## 2016-11-26 DIAGNOSIS — M25551 Pain in right hip: Secondary | ICD-10-CM | POA: Diagnosis not present

## 2016-11-26 DIAGNOSIS — M545 Low back pain: Secondary | ICD-10-CM | POA: Diagnosis not present

## 2016-11-28 DIAGNOSIS — M545 Low back pain: Secondary | ICD-10-CM | POA: Diagnosis not present

## 2016-11-28 DIAGNOSIS — M25551 Pain in right hip: Secondary | ICD-10-CM | POA: Diagnosis not present

## 2016-11-30 DIAGNOSIS — M25551 Pain in right hip: Secondary | ICD-10-CM | POA: Diagnosis not present

## 2016-11-30 DIAGNOSIS — M545 Low back pain: Secondary | ICD-10-CM | POA: Diagnosis not present

## 2016-12-10 DIAGNOSIS — M25551 Pain in right hip: Secondary | ICD-10-CM | POA: Diagnosis not present

## 2016-12-10 DIAGNOSIS — M545 Low back pain: Secondary | ICD-10-CM | POA: Diagnosis not present

## 2016-12-11 DIAGNOSIS — M4807 Spinal stenosis, lumbosacral region: Secondary | ICD-10-CM | POA: Diagnosis not present

## 2016-12-13 DIAGNOSIS — M545 Low back pain: Secondary | ICD-10-CM | POA: Diagnosis not present

## 2016-12-13 DIAGNOSIS — M25551 Pain in right hip: Secondary | ICD-10-CM | POA: Diagnosis not present

## 2016-12-21 DIAGNOSIS — M25551 Pain in right hip: Secondary | ICD-10-CM | POA: Diagnosis not present

## 2016-12-21 DIAGNOSIS — M4807 Spinal stenosis, lumbosacral region: Secondary | ICD-10-CM | POA: Diagnosis not present

## 2016-12-21 DIAGNOSIS — Z96641 Presence of right artificial hip joint: Secondary | ICD-10-CM | POA: Diagnosis not present

## 2016-12-25 DIAGNOSIS — M4807 Spinal stenosis, lumbosacral region: Secondary | ICD-10-CM | POA: Diagnosis not present

## 2016-12-25 DIAGNOSIS — M5137 Other intervertebral disc degeneration, lumbosacral region: Secondary | ICD-10-CM | POA: Diagnosis not present

## 2016-12-25 DIAGNOSIS — M5431 Sciatica, right side: Secondary | ICD-10-CM | POA: Diagnosis not present

## 2016-12-28 DIAGNOSIS — Z96641 Presence of right artificial hip joint: Secondary | ICD-10-CM | POA: Diagnosis not present

## 2017-01-01 DIAGNOSIS — M5416 Radiculopathy, lumbar region: Secondary | ICD-10-CM | POA: Diagnosis not present

## 2017-01-01 DIAGNOSIS — M4807 Spinal stenosis, lumbosacral region: Secondary | ICD-10-CM | POA: Diagnosis not present

## 2017-01-15 DIAGNOSIS — M4807 Spinal stenosis, lumbosacral region: Secondary | ICD-10-CM | POA: Diagnosis not present

## 2017-01-15 DIAGNOSIS — M5137 Other intervertebral disc degeneration, lumbosacral region: Secondary | ICD-10-CM | POA: Diagnosis not present

## 2017-01-24 DIAGNOSIS — M25551 Pain in right hip: Secondary | ICD-10-CM | POA: Diagnosis not present

## 2017-01-28 ENCOUNTER — Ambulatory Visit: Payer: Self-pay | Admitting: Orthopedic Surgery

## 2017-01-28 NOTE — Patient Instructions (Signed)
Lance Hill  01/28/2017   Your procedure is scheduled on: 02/06/2017    Report to [facility] Main  Entrance [place]  elevators to 3rd floor to  Short Stay Center at   0630 AM.    Call this number if you have problems the morning of surgery [phone]    Remember: ONLY 1 PERSON MAY GO WITH YOU TO SHORT STAY TO GET  READY MORNING OF YOUR SURGERY.  Do not eat food or drink liquids :After Midnight.     Take these medicines the morning of surgery with A SIP OF WATER:                                 You may not have any metal on your body including hair pins and              piercings  Do not wear jewelry,  lotions, powders or perfumes, deodorant                          Men may shave face and neck.   Do not bring valuables to the hospital. [facility] IS NOT             RESPONSIBLE   FOR VALUABLES.  Contacts, dentures or bridgework may not be worn into surgery.  Leave suitcase in the car. After surgery it may be brought to your room.                       Please read over the following fact sheets you were given: _____________________________________________________________________             [facility] - Preparing for Surgery Before surgery, you can play an important role.  Because skin is not sterile, your skin needs to be as free of germs as possible.  You can reduce the number of germs on your skin by washing with CHG (chlorahexidine gluconate) soap before surgery.  CHG is an antiseptic cleaner which kills germs and bonds with the skin to continue killing germs even after washing. Please DO NOT use if you have an allergy to CHG or antibacterial soaps.  If your skin becomes reddened/irritated stop using the CHG and inform your nurse when you arrive at Short Stay. Do not shave (including legs and underarms) for at least 48 hours prior to the first CHG shower.  You may shave your face/neck. Please follow these instructions carefully:  1.  Shower  with CHG Soap the night before surgery and the  morning of Surgery.  2.  If you choose to wash your hair, wash your hair first as usual with your  normal  shampoo.  3.  After you shampoo, rinse your hair and body thoroughly to remove the  shampoo.                           4.  Use CHG as you would any other liquid soap.  You can apply chg directly  to the skin and wash                       Gently with a scrungie or clean washcloth.  5.  Apply the CHG Soap to your body ONLY  FROM THE NECK DOWN.   Do not use on face/ open                           Wound or open sores. Avoid contact with eyes, ears mouth and genitals (private parts).                       Wash face,  Genitals (private parts) with your normal soap.             6.  Wash thoroughly, paying special attention to the area where your surgery  will be performed.  7.  Thoroughly rinse your body with warm water from the neck down.  8.  DO NOT shower/wash with your normal soap after using and rinsing off  the CHG Soap.                9.  Pat yourself dry with a clean towel.            10.  Wear clean pajamas.            11.  Place clean sheets on your bed the night of your first shower and do not  sleep with pets. Day of Surgery : Do not apply any lotions/deodorants the morning of surgery.  Please wear clean clothes to the hospital/surgery center.  FAILURE TO FOLLOW THESE INSTRUCTIONS MAY RESULT IN THE CANCELLATION OF YOUR SURGERY PATIENT SIGNATURE_________________________________  NURSE SIGNATURE__________________________________  ________________________________________________________________________   Rogelia Mire  An incentive spirometer is a tool that can help keep your lungs clear and active. This tool measures how well you are filling your lungs with each breath. Taking long deep breaths may help reverse or decrease the chance of developing breathing (pulmonary) problems (especially infection) following:  A long period  of time when you are unable to move or be active. BEFORE THE PROCEDURE   If the spirometer includes an indicator to show your best effort, your nurse or respiratory therapist will set it to a desired goal.  If possible, sit up straight or lean slightly forward. Try not to slouch.  Hold the incentive spirometer in an upright position. INSTRUCTIONS FOR USE  1. Sit on the edge of your bed if possible, or sit up as far as you can in bed or on a chair. 2. Hold the incentive spirometer in an upright position. 3. Breathe out normally. 4. Place the mouthpiece in your mouth and seal your lips tightly around it. 5. Breathe in slowly and as deeply as possible, raising the piston or the ball toward the top of the column. 6. Hold your breath for 3-5 seconds or for as long as possible. Allow the piston or ball to fall to the bottom of the column. 7. Remove the mouthpiece from your mouth and breathe out normally. 8. Rest for a few seconds and repeat Steps 1 through 7 at least 10 times every 1-2 hours when you are awake. Take your time and take a few normal breaths between deep breaths. 9. The spirometer may include an indicator to show your best effort. Use the indicator as a goal to work toward during each repetition. 10. After each set of 10 deep breaths, practice coughing to be sure your lungs are clear. If you have an incision (the cut made at the time of surgery), support your incision when coughing by placing a pillow or rolled up towels firmly against it. Once you  are able to get out of bed, walk around indoors and cough well. You may stop using the incentive spirometer when instructed by your caregiver.  RISKS AND COMPLICATIONS  Take your time so you do not get dizzy or light-headed.  If you are in pain, you may need to take or ask for pain medication before doing incentive spirometry. It is harder to take a deep breath if you are having pain. AFTER USE  Rest and breathe slowly and easily.  It  can be helpful to keep track of a log of your progress. Your caregiver can provide you with a simple table to help with this. If you are using the spirometer at home, follow these instructions: SEEK MEDICAL CARE IF:   You are having difficultly using the spirometer.  You have trouble using the spirometer as often as instructed.  Your pain medication is not giving enough relief while using the spirometer.  You develop fever of 100.5 F (38.1 C) or higher. SEEK IMMEDIATE MEDICAL CARE IF:   You cough up bloody sputum that had not been present before.  You develop fever of 102 F (38.9 C) or greater.  You develop worsening pain at or near the incision site. MAKE SURE YOU:   Understand these instructions.  Will watch your condition.  Will get help right away if you are not doing well or get worse. Document Released: 08/27/2006 Document Revised: 07/09/2011 Document Reviewed: 10/28/2006 [facility] Patient Information 2014 [place], [place].   ________________________________________________________________________

## 2017-01-29 ENCOUNTER — Encounter (HOSPITAL_COMMUNITY): Payer: Self-pay

## 2017-01-29 ENCOUNTER — Ambulatory Visit (HOSPITAL_COMMUNITY)
Admission: RE | Admit: 2017-01-29 | Discharge: 2017-01-29 | Disposition: A | Discharge status: Home / Self Care | Payer: Medicare Other | Source: Ambulatory Visit | Attending: Orthopedic Surgery | Admitting: Orthopedic Surgery

## 2017-01-29 ENCOUNTER — Encounter (HOSPITAL_COMMUNITY)
Admission: RE | Admit: 2017-01-29 | Discharge: 2017-01-29 | Disposition: A | Discharge status: Home / Self Care | Payer: Medicare Other | Source: Ambulatory Visit | Attending: Specialist | Admitting: Specialist

## 2017-01-29 DIAGNOSIS — E119 Type 2 diabetes mellitus without complications: Secondary | ICD-10-CM | POA: Insufficient documentation

## 2017-01-29 DIAGNOSIS — Z01818 Encounter for other preprocedural examination: Secondary | ICD-10-CM | POA: Diagnosis not present

## 2017-01-29 DIAGNOSIS — M5135 Other intervertebral disc degeneration, thoracolumbar region: Secondary | ICD-10-CM | POA: Insufficient documentation

## 2017-01-29 DIAGNOSIS — M5126 Other intervertebral disc displacement, lumbar region: Secondary | ICD-10-CM | POA: Insufficient documentation

## 2017-01-29 DIAGNOSIS — Z01812 Encounter for preprocedural laboratory examination: Secondary | ICD-10-CM | POA: Diagnosis not present

## 2017-01-29 DIAGNOSIS — K219 Gastro-esophageal reflux disease without esophagitis: Secondary | ICD-10-CM | POA: Diagnosis not present

## 2017-01-29 DIAGNOSIS — M5136 Other intervertebral disc degeneration, lumbar region: Secondary | ICD-10-CM | POA: Diagnosis not present

## 2017-01-29 HISTORY — DX: Gastro-esophageal reflux disease without esophagitis: K21.9

## 2017-01-29 HISTORY — DX: Type 2 diabetes mellitus without complications: E11.9

## 2017-01-29 HISTORY — DX: Unspecified osteoarthritis, unspecified site: M19.90

## 2017-01-29 HISTORY — DX: Personal history of urinary calculi: Z87.442

## 2017-01-29 LAB — BASIC METABOLIC PANEL
ANION GAP: 7 (ref 5–15)
BUN: 27 mg/dL — ABNORMAL HIGH (ref 6–20)
CALCIUM: 9.7 mg/dL (ref 8.9–10.3)
CO2: 26 mmol/L (ref 22–32)
CREATININE: 1.13 mg/dL (ref 0.61–1.24)
Chloride: 107 mmol/L (ref 101–111)
GFR calc Af Amer: >60 mL/min (ref >60)
GLUCOSE: 159 mg/dL — AB (ref 65–99)
Potassium: 4.4 mmol/L (ref 3.5–5.1)
Sodium: 140 mmol/L (ref 135–145)

## 2017-01-29 LAB — SURGICAL PCR SCREEN
MRSA, PCR: NEGATIVE
STAPHYLOCOCCUS AUREUS: NEGATIVE

## 2017-01-29 LAB — CBC
HCT: 37.4 % — ABNORMAL LOW (ref 39.0–52.0)
Hemoglobin: 12.2 g/dL — ABNORMAL LOW (ref 13.0–17.0)
MCH: 29.5 pg (ref 26.0–34.0)
MCHC: 32.6 g/dL (ref 30.0–36.0)
MCV: 90.6 fL (ref 78.0–100.0)
PLATELETS: 169 10*3/uL (ref 150–400)
RBC: 4.13 MIL/uL — ABNORMAL LOW (ref 4.22–5.81)
RDW: 13.4 % (ref 11.5–15.5)
WBC: 5.4 10*3/uL (ref 4.0–10.5)

## 2017-01-29 LAB — HEMOGLOBIN A1C
HEMOGLOBIN A1C: 6.7 % — AB (ref 4.8–5.6)
MEAN PLASMA GLUCOSE: 145.59 mg/dL

## 2017-01-29 LAB — GLUCOSE, CAPILLARY: GLUCOSE-CAPILLARY: 165 mg/dL — AB (ref 65–99)

## 2017-01-29 NOTE — Progress Notes (Signed)
BMP done 01/29/17 faxed via epic to Dr Shelle Iron.

## 2017-01-29 NOTE — Progress Notes (Signed)
EKG done  at [facility] in [place] on 11/21/16-on chart  Labs done at [place] in [place] 10/2016 on chart.

## 2017-01-30 ENCOUNTER — Ambulatory Visit: Payer: Self-pay | Admitting: Orthopedic Surgery

## 2017-01-30 NOTE — H&P (Signed)
Lance Hill is an 71 y.o. male.   Chief Complaint: back and R leg pain HPI: The patient is a 71 year old male who presents today for follow up of their back. The patient is being followed for their back pain. They are now year(s) out from when symptoms began. Symptoms reported today include: pain. The patient states that they are doing 50 percent better. Current treatment includes: NSAIDs (Aleve prn), pain medications (OTC) and Biofreeze. The following medication has been used for pain control: antiinflammatory medication (prn) and Tylenol. The patient reports their current pain level to be 3 / 10 (but can be 5/10). The patient presents today following ESI (@ L5-S1). The patient has reported improvement of their symptoms with: Cortisone injections.  Lance Hill follows up and reports again he has had significant reduction of his pain following the injection. The lidocaine portion he reports pain relief and then it returned to his baseline and then significant reduction of his buttock and thigh pain following the injection. It is slowly returning again. He is taking Tylenol and Aleve. He is also seen Dr. Wynelle Link feels he does have a complement of hip pain on the right. He had an aspiration which is negative for infection. No fevers chills still has some weakness in the leg.  Past Medical History:  Diagnosis Date  . Arthritis   . Diabetes mellitus without complication ([place])    type II   . GERD (gastroesophageal reflux disease)   . History of kidney stones    hx of     Past Surgical History:  Procedure Laterality Date  . CHOLECYSTECTOMY    . EYE SURGERY     right eye surgery , bilateral cataract   . JOINT REPLACEMENT     hip and knee replacements   . TONSILLECTOMY      No family history on file. Social History:  reports that he has never smoked. He has never used smokeless tobacco. He reports that he does not drink alcohol or use drugs.  Allergies: No Known Allergies   (Not in a  hospital admission)  Results for orders placed or performed during the hospital encounter of 01/29/17 (from the past 48 hour(s))  Surgical pcr screen     Status: None   Collection Time: 01/29/17  8:30 AM  Result Value Ref Range   MRSA, PCR NEGATIVE NEGATIVE   Staphylococcus aureus NEGATIVE NEGATIVE    Comment: (NOTE) The Xpert SA Assay (FDA approved for NASAL specimens in patients 72 years of age and older), is one component of a comprehensive surveillance program. It is not intended to diagnose infection nor to guide or monitor treatment.   Glucose, capillary     Status: Abnormal   Collection Time: 01/29/17  8:36 AM  Result Value Ref Range   Glucose-Capillary 165 (H) 65 - 99 mg/dL  Basic metabolic panel     Status: Abnormal   Collection Time: 01/29/17  8:52 AM  Result Value Ref Range   Sodium 140 135 - 145 mmol/L   Potassium 4.4 3.5 - 5.1 mmol/L   Chloride 107 101 - 111 mmol/L   CO2 26 22 - 32 mmol/L   Glucose, Bld 159 (H) 65 - 99 mg/dL   BUN 27 (H) 6 - 20 mg/dL   Creatinine, Ser 1.13 0.61 - 1.24 mg/dL   Calcium 9.7 8.9 - 10.3 mg/dL   GFR calc non Af Amer >60 >60 mL/min   GFR calc Af Amer >60 >60 mL/min  Comment: (NOTE) The eGFR has been calculated using the CKD EPI equation. This calculation has not been validated in all clinical situations. eGFR's persistently <60 mL/min signify possible Chronic Kidney Disease.    Anion gap 7 5 - 15  CBC     Status: Abnormal   Collection Time: 01/29/17  8:52 AM  Result Value Ref Range   WBC 5.4 4.0 - 10.5 K/uL   RBC 4.13 (L) 4.22 - 5.81 MIL/uL   Hemoglobin 12.2 (L) 13.0 - 17.0 g/dL   HCT 37.4 (L) 39.0 - 52.0 %   MCV 90.6 78.0 - 100.0 fL   MCH 29.5 26.0 - 34.0 pg   MCHC 32.6 30.0 - 36.0 g/dL   RDW 13.4 11.5 - 15.5 %   Platelets 169 150 - 400 K/uL  Hemoglobin A1c     Status: Abnormal   Collection Time: 01/29/17  8:52 AM  Result Value Ref Range   Hgb A1c MFr Bld 6.7 (H) 4.8 - 5.6 %    Comment: (NOTE) Pre diabetes:           5.7%-6.4% Diabetes:              >6.4% Glycemic control for   <7.0% adults with diabetes    Mean Plasma Glucose 145.59 mg/dL    Comment: Performed at [facility], [place] [address] [place], [place] [address]   Dg Lumbar Spine 2-3 Views  Result Date: 01/29/2017 CLINICAL DATA:  Preop for lumbar spine surgery, chronic low back pain EXAM: LUMBAR SPINE - 2-3 VIEW COMPARISON:  None. FINDINGS: There is mild curvature of the thoracolumbar spine. Degenerative disc disease is noted diffusely particularly at L1-2, L2-3, and L3-4 levels where there is loss of disc space and sclerosis with spurring. No compression deformity is seen. The SI joints are corticated. Right total hip replacement is noted. IMPRESSION: Mild curvature of thoracolumbar spine with diffuse degenerative disc disease. No acute abnormality. Electronically Signed   By: Ivar Drape M.D.   On: 01/29/2017 12:22    Review of Systems  Constitutional: Negative.   HENT: Negative.   Eyes: Negative.   Respiratory: Negative.   Cardiovascular: Negative.   Gastrointestinal: Negative.   Genitourinary: Negative.   Musculoskeletal: Positive for back pain and joint pain.  Skin: Negative.   Neurological: Positive for sensory change and focal weakness.  Psychiatric/Behavioral: Negative.     There were no vitals taken for this visit. Physical Exam  Constitutional: He is oriented to person, place, and time. He appears well-developed. He appears distressed.  HENT:  Head: Normocephalic.  Eyes: Pupils are equal, round, and reactive to light.  Neck: Normal range of motion.  Cardiovascular: Normal rate.   Respiratory: Effort normal.  GI: Soft.  Musculoskeletal:  Healthy mild distress. Straight leg raise low back and buttock pain. EHLs 4+/5 on the right compared to left. Initial repetitive plantar flexion on the right compared to the left. Pain with extension. Tender over the trochanter. No DVT.  Neurological: He is alert and oriented to  person, place, and time.  Skin: Skin is warm and dry.     Assessment/Plan HNP/stenosis L5-S1 right Right lower extremity radicular pain L5-S1 nerve root distribution secondary to disc protrusion L5-S1 and lateral recess stenosis secondary to facet hypertrophy on the right. With diagnostic and therapeutic injection with symptoms returning. Then with continued weakness in the EHL.   Plan: An extensive discussion with Lance Hill concerning current pathology relevant anatomy and treatment options. Clear that he has multiple  pain generators however one that is apparent is his disc herniation lateral recess stenosis at L5-S1 on the right. Again may temporarily better by an epidural steroid injection at that level. With his EHL weakness and persistent buttock and leg pain is reasonable at this point time to consider a microlumbar decompression at L5-S1 on the right. This is to address buttock and leg pain not back pain. He will have ongoing hip problems from other source. Discussed the procedure in detail including the overnight procedure. Microscopic lumbar decompression. Initial ambulation sutures out in 2 weeks 6 weeks till maximum medical improvement. Risk and benefits including infection CSF leak. Need for fusion in the future. Possible recurrent disc herniation. Anesthetic complication etc.   Plan microlumbar decompression L5-S1 right  Cecilie Kicks., PA-C for Dr. Tonita Cong 01/30/2017, 9:02 AM

## 2017-02-06 ENCOUNTER — Ambulatory Visit (HOSPITAL_COMMUNITY): Payer: Medicare Other

## 2017-02-06 ENCOUNTER — Ambulatory Visit (HOSPITAL_COMMUNITY)
Admission: RE | Admit: 2017-02-06 | Discharge: 2017-02-07 | Disposition: A | Discharge status: Home / Self Care | Payer: Medicare Other | Source: Ambulatory Visit | Attending: Specialist | Admitting: Specialist

## 2017-02-06 ENCOUNTER — Ambulatory Visit (HOSPITAL_COMMUNITY): Payer: Medicare Other | Admitting: Certified Registered"

## 2017-02-06 ENCOUNTER — Encounter (HOSPITAL_COMMUNITY): Payer: Self-pay | Admitting: Certified Registered"

## 2017-02-06 ENCOUNTER — Encounter (HOSPITAL_COMMUNITY)
Admission: RE | Disposition: A | Discharge status: Home / Self Care | Payer: Self-pay | Source: Ambulatory Visit | Attending: Specialist

## 2017-02-06 DIAGNOSIS — Z96659 Presence of unspecified artificial knee joint: Secondary | ICD-10-CM | POA: Diagnosis not present

## 2017-02-06 DIAGNOSIS — M5117 Intervertebral disc disorders with radiculopathy, lumbosacral region: Secondary | ICD-10-CM | POA: Diagnosis not present

## 2017-02-06 DIAGNOSIS — I10 Essential (primary) hypertension: Secondary | ICD-10-CM | POA: Diagnosis not present

## 2017-02-06 DIAGNOSIS — Z7982 Long term (current) use of aspirin: Secondary | ICD-10-CM | POA: Diagnosis not present

## 2017-02-06 DIAGNOSIS — K219 Gastro-esophageal reflux disease without esophagitis: Secondary | ICD-10-CM | POA: Diagnosis not present

## 2017-02-06 DIAGNOSIS — Z7984 Long term (current) use of oral hypoglycemic drugs: Secondary | ICD-10-CM | POA: Insufficient documentation

## 2017-02-06 DIAGNOSIS — Z96649 Presence of unspecified artificial hip joint: Secondary | ICD-10-CM | POA: Insufficient documentation

## 2017-02-06 DIAGNOSIS — Z6829 Body mass index (BMI) 29.0-29.9, adult: Secondary | ICD-10-CM | POA: Diagnosis not present

## 2017-02-06 DIAGNOSIS — M48061 Spinal stenosis, lumbar region without neurogenic claudication: Secondary | ICD-10-CM

## 2017-02-06 DIAGNOSIS — E119 Type 2 diabetes mellitus without complications: Secondary | ICD-10-CM | POA: Diagnosis not present

## 2017-02-06 DIAGNOSIS — M5136 Other intervertebral disc degeneration, lumbar region: Secondary | ICD-10-CM | POA: Diagnosis not present

## 2017-02-06 DIAGNOSIS — M5127 Other intervertebral disc displacement, lumbosacral region: Secondary | ICD-10-CM | POA: Diagnosis not present

## 2017-02-06 DIAGNOSIS — M48062 Spinal stenosis, lumbar region with neurogenic claudication: Secondary | ICD-10-CM | POA: Diagnosis not present

## 2017-02-06 DIAGNOSIS — M549 Dorsalgia, unspecified: Secondary | ICD-10-CM | POA: Diagnosis not present

## 2017-02-06 DIAGNOSIS — M4807 Spinal stenosis, lumbosacral region: Secondary | ICD-10-CM | POA: Diagnosis not present

## 2017-02-06 DIAGNOSIS — Z419 Encounter for procedure for purposes other than remedying health state, unspecified: Secondary | ICD-10-CM

## 2017-02-06 HISTORY — DX: Spinal stenosis, lumbar region without neurogenic claudication: M48.061

## 2017-02-06 HISTORY — PX: LUMBAR LAMINECTOMY/DECOMPRESSION MICRODISCECTOMY: SHX5026

## 2017-02-06 LAB — GLUCOSE, CAPILLARY
GLUCOSE-CAPILLARY: 147 mg/dL — AB (ref 65–99)
GLUCOSE-CAPILLARY: 247 mg/dL — AB (ref 65–99)
GLUCOSE-CAPILLARY: 264 mg/dL — AB (ref 65–99)
Glucose-Capillary: 116 mg/dL — ABNORMAL HIGH (ref 65–99)
Glucose-Capillary: 280 mg/dL — ABNORMAL HIGH (ref 65–99)

## 2017-02-06 SURGERY — LUMBAR LAMINECTOMY/DECOMPRESSION MICRODISCECTOMY 1 LEVEL
Anesthesia: General | Laterality: Right

## 2017-02-06 MED ORDER — ACETAMINOPHEN 10 MG/ML IV SOLN
1000.0000 mg | INTRAVENOUS | Status: AC
  Administered 2017-02-06: 1000 mg via INTRAVENOUS

## 2017-02-06 MED ORDER — EPHEDRINE SULFATE-NACL 50-0.9 MG/10ML-% IV SOSY
PREFILLED_SYRINGE | INTRAVENOUS | Status: DC | PRN
  Administered 2017-02-06 (×2): 5 mg via INTRAVENOUS
  Administered 2017-02-06 (×2): 10 mg via INTRAVENOUS
  Administered 2017-02-06: 5 mg via INTRAVENOUS

## 2017-02-06 MED ORDER — INSULIN ASPART 100 UNIT/ML ~~LOC~~ SOLN
0.0000 [IU] | Freq: Three times a day (TID) | SUBCUTANEOUS | Status: DC
  Administered 2017-02-06: 5 [IU] via SUBCUTANEOUS
  Administered 2017-02-06: 19:00:00 8 [IU] via SUBCUTANEOUS
  Administered 2017-02-07: 3 [IU] via SUBCUTANEOUS
  Administered 2017-02-07: 2 [IU] via SUBCUTANEOUS

## 2017-02-06 MED ORDER — CEFAZOLIN SODIUM-DEXTROSE 2-4 GM/100ML-% IV SOLN
2.0000 g | INTRAVENOUS | Status: AC
  Administered 2017-02-06: 2 g via INTRAVENOUS

## 2017-02-06 MED ORDER — PROPOFOL 10 MG/ML IV BOLUS
INTRAVENOUS | Status: DC | PRN
  Administered 2017-02-06: 130 mg via INTRAVENOUS

## 2017-02-06 MED ORDER — METHOCARBAMOL 500 MG PO TABS
500.0000 mg | ORAL_TABLET | Freq: Four times a day (QID) | ORAL | Status: DC | PRN
  Administered 2017-02-06 – 2017-02-07 (×2): 500 mg via ORAL
  Filled 2017-02-06 (×2): qty 1

## 2017-02-06 MED ORDER — FENTANYL CITRATE (PF) 100 MCG/2ML IJ SOLN
INTRAMUSCULAR | Status: AC
  Filled 2017-02-06: qty 4

## 2017-02-06 MED ORDER — ONDANSETRON HCL 4 MG/2ML IJ SOLN
INTRAMUSCULAR | Status: DC | PRN
  Administered 2017-02-06: 4 mg via INTRAVENOUS

## 2017-02-06 MED ORDER — OXYCODONE HCL 5 MG PO TABS
5.0000 mg | ORAL_TABLET | ORAL | Status: DC | PRN
  Administered 2017-02-06: 14:00:00 10 mg via ORAL
  Administered 2017-02-06: 5 mg via ORAL
  Administered 2017-02-07 (×2): 10 mg via ORAL
  Administered 2017-02-07: 5 mg via ORAL
  Filled 2017-02-06 (×2): qty 1
  Filled 2017-02-06 (×3): qty 2

## 2017-02-06 MED ORDER — GABAPENTIN 300 MG PO CAPS
300.0000 mg | ORAL_CAPSULE | Freq: Two times a day (BID) | ORAL | Status: DC
  Administered 2017-02-06 – 2017-02-07 (×3): 300 mg via ORAL
  Filled 2017-02-06 (×3): qty 1

## 2017-02-06 MED ORDER — CEFAZOLIN SODIUM-DEXTROSE 2-4 GM/100ML-% IV SOLN
INTRAVENOUS | Status: AC
  Filled 2017-02-06: qty 100

## 2017-02-06 MED ORDER — INSULIN ASPART 100 UNIT/ML ~~LOC~~ SOLN
3.0000 [IU] | Freq: Once | SUBCUTANEOUS | Status: AC
  Administered 2017-02-06: 22:00:00 3 [IU] via SUBCUTANEOUS

## 2017-02-06 MED ORDER — BRINZOLAMIDE 1 % OP SUSP
1.0000 [drp] | Freq: Three times a day (TID) | OPHTHALMIC | Status: DC
  Administered 2017-02-06 – 2017-02-07 (×3): 1 [drp] via OPHTHALMIC
  Filled 2017-02-06: qty 10

## 2017-02-06 MED ORDER — PHENYLEPHRINE 40 MCG/ML (10ML) SYRINGE FOR IV PUSH (FOR BLOOD PRESSURE SUPPORT)
PREFILLED_SYRINGE | INTRAVENOUS | Status: DC | PRN
  Administered 2017-02-06: 80 ug via INTRAVENOUS

## 2017-02-06 MED ORDER — ONDANSETRON HCL 4 MG/2ML IJ SOLN: 4.0000 mg | Freq: Four times a day (QID) | INTRAMUSCULAR | Status: DC | PRN

## 2017-02-06 MED ORDER — MIDAZOLAM HCL 2 MG/2ML IJ SOLN
INTRAMUSCULAR | Status: DC | PRN
  Administered 2017-02-06: 0.5 mg via INTRAVENOUS

## 2017-02-06 MED ORDER — MAGNESIUM CITRATE PO SOLN: 1.0000 | Freq: Once | ORAL | Status: DC | PRN

## 2017-02-06 MED ORDER — ACETAMINOPHEN 160 MG/5ML PO SOLN: 325.0000 mg | ORAL | Status: DC | PRN

## 2017-02-06 MED ORDER — CEFAZOLIN SODIUM-DEXTROSE 2-4 GM/100ML-% IV SOLN
2.0000 g | Freq: Three times a day (TID) | INTRAVENOUS | Status: AC
  Administered 2017-02-06 (×2): 2 g via INTRAVENOUS
  Filled 2017-02-06 (×2): qty 100

## 2017-02-06 MED ORDER — INFLUENZA VAC SPLIT HIGH-DOSE 0.5 ML IM SUSY: 0.5000 mL | PREFILLED_SYRINGE | INTRAMUSCULAR | Status: DC

## 2017-02-06 MED ORDER — THROMBIN 5000 UNITS EX SOLR
OROMUCOSAL | Status: DC | PRN
  Administered 2017-02-06: 10 mL via TOPICAL

## 2017-02-06 MED ORDER — BRIMONIDINE TARTRATE 0.2 % OP SOLN
1.0000 [drp] | Freq: Three times a day (TID) | OPHTHALMIC | Status: DC
  Administered 2017-02-06 – 2017-02-07 (×3): 1 [drp] via OPHTHALMIC
  Filled 2017-02-06: qty 5

## 2017-02-06 MED ORDER — FENTANYL CITRATE (PF) 100 MCG/2ML IJ SOLN: 25.0000 ug | INTRAMUSCULAR | Status: DC | PRN

## 2017-02-06 MED ORDER — DOCUSATE SODIUM 100 MG PO CAPS: 100.0000 mg | ORAL_CAPSULE | Freq: Two times a day (BID) | ORAL | 1 refills | Status: DC | PRN

## 2017-02-06 MED ORDER — PHENOL 1.4 % MT LIQD
1.0000 | OROMUCOSAL | Status: DC | PRN
  Filled 2017-02-06: qty 177

## 2017-02-06 MED ORDER — OXYCODONE HCL 5 MG PO TABS: 5.0000 mg | ORAL_TABLET | Freq: Once | ORAL | Status: DC | PRN

## 2017-02-06 MED ORDER — HYDROCHLOROTHIAZIDE 25 MG PO TABS
12.5000 mg | ORAL_TABLET | Freq: Every day | ORAL | Status: DC
Start: 2017-02-07 — End: 2017-02-07

## 2017-02-06 MED ORDER — DOCUSATE SODIUM 100 MG PO CAPS
100.0000 mg | ORAL_CAPSULE | Freq: Two times a day (BID) | ORAL | Status: DC
  Administered 2017-02-07: 100 mg via ORAL
  Filled 2017-02-06: qty 1

## 2017-02-06 MED ORDER — TRAMADOL HCL 50 MG PO TABS: 50.0000 mg | ORAL_TABLET | Freq: Four times a day (QID) | ORAL | 1 refills | Status: DC | PRN

## 2017-02-06 MED ORDER — POLYETHYLENE GLYCOL 3350 17 G PO PACK: 17.0000 g | PACK | Freq: Every day | ORAL | Status: DC | PRN

## 2017-02-06 MED ORDER — POLYETHYLENE GLYCOL 3350 17 G PO PACK: 17.0000 g | PACK | Freq: Every day | ORAL | 0 refills | Status: DC

## 2017-02-06 MED ORDER — THROMBIN 5000 UNITS EX SOLR
CUTANEOUS | Status: AC
  Filled 2017-02-06: qty 10000

## 2017-02-06 MED ORDER — FAMOTIDINE 20 MG PO TABS
20.0000 mg | ORAL_TABLET | Freq: Every day | ORAL | Status: DC
  Administered 2017-02-07: 10:00:00 20 mg via ORAL
  Filled 2017-02-06: qty 1

## 2017-02-06 MED ORDER — MENTHOL 3 MG MT LOZG
1.0000 | LOZENGE | OROMUCOSAL | Status: DC | PRN
  Administered 2017-02-07: 12:00:00 3 mg via ORAL
  Filled 2017-02-06: qty 9

## 2017-02-06 MED ORDER — ACETAMINOPHEN 10 MG/ML IV SOLN
INTRAVENOUS | Status: AC
  Filled 2017-02-06: qty 100

## 2017-02-06 MED ORDER — ACETAMINOPHEN 500 MG PO TABS
1000.0000 mg | ORAL_TABLET | Freq: Four times a day (QID) | ORAL | Status: AC
  Administered 2017-02-06 – 2017-02-07 (×4): 1000 mg via ORAL
  Filled 2017-02-06 (×4): qty 2

## 2017-02-06 MED ORDER — ACETAMINOPHEN 325 MG PO TABS: 650.0000 mg | ORAL_TABLET | Freq: Four times a day (QID) | ORAL | Status: DC | PRN

## 2017-02-06 MED ORDER — LIDOCAINE-EPINEPHRINE (PF) 1 %-1:200000 IJ SOLN
INTRAMUSCULAR | Status: DC | PRN
  Administered 2017-02-06: 15 mL

## 2017-02-06 MED ORDER — LIDOCAINE-EPINEPHRINE (PF) 1 %-1:200000 IJ SOLN
INTRAMUSCULAR | Status: AC
  Filled 2017-02-06: qty 30

## 2017-02-06 MED ORDER — TRAMADOL HCL 50 MG PO TABS: 50.0000 mg | ORAL_TABLET | Freq: Four times a day (QID) | ORAL | Status: DC | PRN

## 2017-02-06 MED ORDER — DEXAMETHASONE SODIUM PHOSPHATE 10 MG/ML IJ SOLN
INTRAMUSCULAR | Status: DC | PRN
  Administered 2017-02-06: 4 mg via INTRAVENOUS

## 2017-02-06 MED ORDER — RISAQUAD PO CAPS
1.0000 | ORAL_CAPSULE | Freq: Every day | ORAL | Status: DC
  Administered 2017-02-06 – 2017-02-07 (×2): 1 via ORAL
  Filled 2017-02-06 (×2): qty 1

## 2017-02-06 MED ORDER — LORATADINE 10 MG PO TABS
10.0000 mg | ORAL_TABLET | Freq: Every day | ORAL | Status: DC
  Administered 2017-02-07: 10:00:00 10 mg via ORAL
  Filled 2017-02-06: qty 1

## 2017-02-06 MED ORDER — FENTANYL CITRATE (PF) 250 MCG/5ML IJ SOLN
INTRAMUSCULAR | Status: DC | PRN
  Administered 2017-02-06: 100 ug via INTRAVENOUS
  Administered 2017-02-06 (×3): 50 ug via INTRAVENOUS

## 2017-02-06 MED ORDER — ACETAMINOPHEN 325 MG PO TABS: 650.0000 mg | ORAL_TABLET | ORAL | Status: DC | PRN

## 2017-02-06 MED ORDER — LOSARTAN POTASSIUM 50 MG PO TABS: 100.0000 mg | ORAL_TABLET | Freq: Every day | ORAL | Status: DC

## 2017-02-06 MED ORDER — LIDOCAINE 2% (20 MG/ML) 5 ML SYRINGE
INTRAMUSCULAR | Status: DC | PRN
  Administered 2017-02-06: 60 mg via INTRAVENOUS

## 2017-02-06 MED ORDER — FENTANYL CITRATE (PF) 250 MCG/5ML IJ SOLN
INTRAMUSCULAR | Status: AC
  Filled 2017-02-06: qty 5

## 2017-02-06 MED ORDER — PROPOFOL 10 MG/ML IV BOLUS
INTRAVENOUS | Status: AC
  Filled 2017-02-06: qty 20

## 2017-02-06 MED ORDER — ACETAMINOPHEN 650 MG RE SUPP
650.0000 mg | RECTAL | Status: DC | PRN
Start: 2017-02-06 — End: 2017-02-07

## 2017-02-06 MED ORDER — MIDAZOLAM HCL 2 MG/2ML IJ SOLN
INTRAMUSCULAR | Status: AC
  Filled 2017-02-06: qty 2

## 2017-02-06 MED ORDER — BISACODYL 5 MG PO TBEC: 5.0000 mg | DELAYED_RELEASE_TABLET | Freq: Every day | ORAL | Status: DC | PRN

## 2017-02-06 MED ORDER — ONDANSETRON HCL 4 MG PO TABS: 4.0000 mg | ORAL_TABLET | Freq: Four times a day (QID) | ORAL | Status: DC | PRN

## 2017-02-06 MED ORDER — ACETAMINOPHEN 325 MG PO TABS: 325.0000 mg | ORAL_TABLET | ORAL | Status: DC | PRN

## 2017-02-06 MED ORDER — ALUM & MAG HYDROXIDE-SIMETH 200-200-20 MG/5ML PO SUSP: 30.0000 mL | Freq: Four times a day (QID) | ORAL | Status: DC | PRN

## 2017-02-06 MED ORDER — LACTATED RINGERS IV SOLN
INTRAVENOUS | Status: DC
  Administered 2017-02-06: 08:00:00 via INTRAVENOUS

## 2017-02-06 MED ORDER — SUGAMMADEX SODIUM 200 MG/2ML IV SOLN
INTRAVENOUS | Status: DC | PRN
  Administered 2017-02-06: 200 mg via INTRAVENOUS

## 2017-02-06 MED ORDER — METHOCARBAMOL 1000 MG/10ML IJ SOLN
500.0000 mg | Freq: Four times a day (QID) | INTRAVENOUS | Status: DC | PRN
  Administered 2017-02-06: 500 mg via INTRAVENOUS
  Filled 2017-02-06: qty 550

## 2017-02-06 MED ORDER — POTASSIUM CHLORIDE IN NACL 20-0.45 MEQ/L-% IV SOLN
INTRAVENOUS | Status: AC
  Administered 2017-02-06: 13:00:00 via INTRAVENOUS
  Filled 2017-02-06 (×2): qty 1000

## 2017-02-06 MED ORDER — ROCURONIUM BROMIDE 10 MG/ML (PF) SYRINGE
PREFILLED_SYRINGE | INTRAVENOUS | Status: DC | PRN
  Administered 2017-02-06: 50 mg via INTRAVENOUS

## 2017-02-06 MED ORDER — OXYCODONE HCL 5 MG/5ML PO SOLN: 5.0000 mg | Freq: Once | ORAL | Status: DC | PRN

## 2017-02-06 MED ORDER — AMLODIPINE BESYLATE 10 MG PO TABS
10.0000 mg | ORAL_TABLET | Freq: Every day | ORAL | Status: DC
  Administered 2017-02-06: 14:00:00 10 mg via ORAL
  Filled 2017-02-06: qty 1

## 2017-02-06 MED ORDER — POLYMYXIN B SULFATE 500000 UNITS IJ SOLR
INTRAMUSCULAR | Status: AC
  Filled 2017-02-06: qty 500000

## 2017-02-06 MED ORDER — SODIUM CHLORIDE 0.9 % IV SOLN
INTRAVENOUS | Status: DC | PRN
  Administered 2017-02-06: 500 mL

## 2017-02-06 SURGICAL SUPPLY — 49 items
BAG ZIPLOCK 12X15 (MISCELLANEOUS) IMPLANT
CLEANER TIP ELECTROSURG 2X2 (MISCELLANEOUS) ×3 IMPLANT
CLOSURE STERI-STRIP 1/4X4 (GAUZE/BANDAGES/DRESSINGS) ×3 IMPLANT
CLOSURE WOUND 1/2 X4 (GAUZE/BANDAGES/DRESSINGS) ×1
CLOTH 2% CHLOROHEXIDINE 3PK (PERSONAL CARE ITEMS) ×3 IMPLANT
COVER SURGICAL LIGHT HANDLE (MISCELLANEOUS) ×3 IMPLANT
DRAPE MICROSCOPE LEICA (MISCELLANEOUS) ×3 IMPLANT
DRAPE POUCH INSTRU U-SHP 10X18 (DRAPES) ×3 IMPLANT
DRAPE SHEET LG 3/4 BI-LAMINATE (DRAPES) ×3 IMPLANT
DRAPE SURG 17X11 SM STRL (DRAPES) ×3 IMPLANT
DRAPE UTILITY XL STRL (DRAPES) ×3 IMPLANT
DRSG AQUACEL AG ADV 3.5X 4 (GAUZE/BANDAGES/DRESSINGS) ×3 IMPLANT
DRSG AQUACEL AG ADV 3.5X 6 (GAUZE/BANDAGES/DRESSINGS) IMPLANT
DURAPREP 26ML APPLICATOR (WOUND CARE) ×3 IMPLANT
DURASEAL SPINE SEALANT 3ML (MISCELLANEOUS) IMPLANT
ELECT BLADE TIP CTD 4 INCH (ELECTRODE) IMPLANT
ELECT REM PT RETURN 15FT ADLT (MISCELLANEOUS) ×3 IMPLANT
GLOVE BIOGEL PI IND STRL 7.0 (GLOVE) ×1 IMPLANT
GLOVE BIOGEL PI INDICATOR 7.0 (GLOVE) ×2
GLOVE SURG SS PI 7.0 STRL IVOR (GLOVE) ×3 IMPLANT
GLOVE SURG SS PI 7.5 STRL IVOR (GLOVE) ×3 IMPLANT
GLOVE SURG SS PI 8.0 STRL IVOR (GLOVE) ×6 IMPLANT
GOWN STRL REUS W/TWL XL LVL3 (GOWN DISPOSABLE) ×6 IMPLANT
HEMOSTAT SPONGE AVITENE ULTRA (HEMOSTASIS) IMPLANT
IV CATH 14GX2 1/4 (CATHETERS) ×3 IMPLANT
KIT BASIN OR (CUSTOM PROCEDURE TRAY) ×3 IMPLANT
KIT POSITIONING SURG ANDREWS (MISCELLANEOUS) ×3 IMPLANT
MANIFOLD NEPTUNE II (INSTRUMENTS) ×3 IMPLANT
NEEDLE SPNL 18GX3.5 QUINCKE PK (NEEDLE) ×6 IMPLANT
PACK LAMINECTOMY ORTHO (CUSTOM PROCEDURE TRAY) ×3 IMPLANT
PATTIES SURGICAL .5 X.5 (GAUZE/BANDAGES/DRESSINGS) ×3 IMPLANT
PATTIES SURGICAL .75X.75 (GAUZE/BANDAGES/DRESSINGS) ×3 IMPLANT
PATTIES SURGICAL 1X1 (DISPOSABLE) IMPLANT
RUBBERBAND STERILE (MISCELLANEOUS) ×6 IMPLANT
SPONGE SURGIFOAM ABS GEL 100 (HEMOSTASIS) ×3 IMPLANT
STAPLER VISISTAT (STAPLE) IMPLANT
STRIP CLOSURE SKIN 1/2X4 (GAUZE/BANDAGES/DRESSINGS) ×2 IMPLANT
SUT NURALON 4 0 TR CR/8 (SUTURE) IMPLANT
SUT PROLENE 3 0 PS 2 (SUTURE) ×3 IMPLANT
SUT VIC AB 1 CT1 27 (SUTURE)
SUT VIC AB 1 CT1 27XBRD ANTBC (SUTURE) IMPLANT
SUT VIC AB 1-0 CT2 27 (SUTURE) ×3 IMPLANT
SUT VIC AB 2-0 CT1 27 (SUTURE)
SUT VIC AB 2-0 CT1 TAPERPNT 27 (SUTURE) IMPLANT
SUT VIC AB 2-0 CT2 27 (SUTURE) ×3 IMPLANT
SYR 3ML LL SCALE MARK (SYRINGE) IMPLANT
TOWEL OR 17X26 10 PK STRL BLUE (TOWEL DISPOSABLE) ×3 IMPLANT
TOWEL OR NON WOVEN STRL DISP B (DISPOSABLE) IMPLANT
YANKAUER SUCT BULB TIP NO VENT (SUCTIONS) IMPLANT

## 2017-02-06 NOTE — H&P (View-Only) (Signed)
Lance Hill is an 71 y.o. male.   Chief Complaint: back and R leg pain HPI: The patient is a 71 year old male who presents today for follow up of their back. The patient is being followed for their back pain. They are now year(s) out from when symptoms began. Symptoms reported today include: pain. The patient states that they are doing 50 percent better. Current treatment includes: NSAIDs (Aleve prn), pain medications (OTC) and Biofreeze. The following medication has been used for pain control: antiinflammatory medication (prn) and Tylenol. The patient reports their current pain level to be 3 / 10 (but can be 5/10). The patient presents today following ESI (@ L5-S1). The patient has reported improvement of their symptoms with: Cortisone injections.  Lance Hill follows up and reports again he has had significant reduction of his pain following the injection. The lidocaine portion he reports pain relief and then it returned to his baseline and then significant reduction of his buttock and thigh pain following the injection. It is slowly returning again. He is taking Tylenol and Aleve. He is also seen Dr. Wynelle Link feels he does have a complement of hip pain on the right. He had an aspiration which is negative for infection. No fevers chills still has some weakness in the leg.  Past Medical History:  Diagnosis Date  . Arthritis   . Diabetes mellitus without complication ([place])    type II   . GERD (gastroesophageal reflux disease)   . History of kidney stones    hx of     Past Surgical History:  Procedure Laterality Date  . CHOLECYSTECTOMY    . EYE SURGERY     right eye surgery , bilateral cataract   . JOINT REPLACEMENT     hip and knee replacements   . TONSILLECTOMY      No family history on file. Social History:  reports that he has never smoked. He has never used smokeless tobacco. He reports that he does not drink alcohol or use drugs.  Allergies: No Known Allergies   (Not in a  hospital admission)  Results for orders placed or performed during the hospital encounter of 01/29/17 (from the past 48 hour(s))  Surgical pcr screen     Status: None   Collection Time: 01/29/17  8:30 AM  Result Value Ref Range   MRSA, PCR NEGATIVE NEGATIVE   Staphylococcus aureus NEGATIVE NEGATIVE    Comment: (NOTE) The Xpert SA Assay (FDA approved for NASAL specimens in patients 73 years of age and older), is one component of a comprehensive surveillance program. It is not intended to diagnose infection nor to guide or monitor treatment.   Glucose, capillary     Status: Abnormal   Collection Time: 01/29/17  8:36 AM  Result Value Ref Range   Glucose-Capillary 165 (H) 65 - 99 mg/dL  Basic metabolic panel     Status: Abnormal   Collection Time: 01/29/17  8:52 AM  Result Value Ref Range   Sodium 140 135 - 145 mmol/L   Potassium 4.4 3.5 - 5.1 mmol/L   Chloride 107 101 - 111 mmol/L   CO2 26 22 - 32 mmol/L   Glucose, Bld 159 (H) 65 - 99 mg/dL   BUN 27 (H) 6 - 20 mg/dL   Creatinine, Ser 1.13 0.61 - 1.24 mg/dL   Calcium 9.7 8.9 - 10.3 mg/dL   GFR calc non Af Amer >60 >60 mL/min   GFR calc Af Amer >60 >60 mL/min  Comment: (NOTE) The eGFR has been calculated using the CKD EPI equation. This calculation has not been validated in all clinical situations. eGFR's persistently <60 mL/min signify possible Chronic Kidney Disease.    Anion gap 7 5 - 15  CBC     Status: Abnormal   Collection Time: 01/29/17  8:52 AM  Result Value Ref Range   WBC 5.4 4.0 - 10.5 K/uL   RBC 4.13 (L) 4.22 - 5.81 MIL/uL   Hemoglobin 12.2 (L) 13.0 - 17.0 g/dL   HCT 37.4 (L) 39.0 - 52.0 %   MCV 90.6 78.0 - 100.0 fL   MCH 29.5 26.0 - 34.0 pg   MCHC 32.6 30.0 - 36.0 g/dL   RDW 13.4 11.5 - 15.5 %   Platelets 169 150 - 400 K/uL  Hemoglobin A1c     Status: Abnormal   Collection Time: 01/29/17  8:52 AM  Result Value Ref Range   Hgb A1c MFr Bld 6.7 (H) 4.8 - 5.6 %    Comment: (NOTE) Pre diabetes:           5.7%-6.4% Diabetes:              >6.4% Glycemic control for   <7.0% adults with diabetes    Mean Plasma Glucose 145.59 mg/dL    Comment: Performed at [facility], [place] [address], [place], [place] [address]   Dg Lumbar Spine 2-3 Views  Result Date: 01/29/2017 CLINICAL DATA:  Preop for lumbar spine surgery, chronic low back pain EXAM: LUMBAR SPINE - 2-3 VIEW COMPARISON:  None. FINDINGS: There is mild curvature of the thoracolumbar spine. Degenerative disc disease is noted diffusely particularly at L1-2, L2-3, and L3-4 levels where there is loss of disc space and sclerosis with spurring. No compression deformity is seen. The SI joints are corticated. Right total hip replacement is noted. IMPRESSION: Mild curvature of thoracolumbar spine with diffuse degenerative disc disease. No acute abnormality. Electronically Signed   By: Ivar Drape M.D.   On: 01/29/2017 12:22    Review of Systems  Constitutional: Negative.   HENT: Negative.   Eyes: Negative.   Respiratory: Negative.   Cardiovascular: Negative.   Gastrointestinal: Negative.   Genitourinary: Negative.   Musculoskeletal: Positive for back pain and joint pain.  Skin: Negative.   Neurological: Positive for sensory change and focal weakness.  Psychiatric/Behavioral: Negative.     There were no vitals taken for this visit. Physical Exam  Constitutional: He is oriented to person, place, and time. He appears well-developed. He appears distressed.  HENT:  Head: Normocephalic.  Eyes: Pupils are equal, round, and reactive to light.  Neck: Normal range of motion.  Cardiovascular: Normal rate.   Respiratory: Effort normal.  GI: Soft.  Musculoskeletal:  Healthy mild distress. Straight leg raise low back and buttock pain. EHLs 4+/5 on the right compared to left. Initial repetitive plantar flexion on the right compared to the left. Pain with extension. Tender over the trochanter. No DVT.  Neurological: He is alert and oriented to  person, place, and time.  Skin: Skin is warm and dry.     Assessment/Plan HNP/stenosis L5-S1 right Right lower extremity radicular pain L5-S1 nerve root distribution secondary to disc protrusion L5-S1 and lateral recess stenosis secondary to facet hypertrophy on the right. With diagnostic and therapeutic injection with symptoms returning. Then with continued weakness in the EHL.   Plan: An extensive discussion with Lance Hill concerning current pathology relevant anatomy and treatment options. Clear that he has multiple  pain generators however one that is apparent is his disc herniation lateral recess stenosis at L5-S1 on the right. Again may temporarily better by an epidural steroid injection at that level. With his EHL weakness and persistent buttock and leg pain is reasonable at this point time to consider a microlumbar decompression at L5-S1 on the right. This is to address buttock and leg pain not back pain. He will have ongoing hip problems from other source. Discussed the procedure in detail including the overnight procedure. Microscopic lumbar decompression. Initial ambulation sutures out in 2 weeks 6 weeks till maximum medical improvement. Risk and benefits including infection CSF leak. Need for fusion in the future. Possible recurrent disc herniation. Anesthetic complication etc.   Plan microlumbar decompression L5-S1 right  Cecilie Kicks., PA-C for Dr. Tonita Cong 01/30/2017, 9:02 AM

## 2017-02-06 NOTE — Anesthesia Preprocedure Evaluation (Addendum)
Anesthesia Evaluation  Patient identified by MRN, date of birth, ID band Patient awake    Reviewed: Allergy & Precautions, NPO status , Patient's Chart, lab work & pertinent test results  History of Anesthesia Complications Negative for: history of anesthetic complications  Airway Mallampati: III  TM Distance: >3 FB Neck ROM: Full    Dental  (+) Chipped, Teeth Intact, Dental Advisory Given,    Pulmonary neg pulmonary ROS,    breath sounds clear to auscultation       Cardiovascular hypertension, Pt. on medications (-) angina(-) Past MI and (-) CHF (-) dysrhythmias  Rhythm:Regular     Neuro/Psych neg Seizures  Neuromuscular disease    GI/Hepatic Neg liver ROS, GERD  Medicated and Controlled,  Endo/Other  diabetes, Type obesity  Renal/GU negative Renal ROS     Musculoskeletal  (+) Arthritis ,   Abdominal   Peds  Hematology  (+) anemia ,   Anesthesia Other Findings   Reproductive/Obstetrics                            Anesthesia Physical Anesthesia Plan  ASA: II  Anesthesia Plan: General   Post-op Pain Management:    Induction: Intravenous  PONV Risk Score and Plan: 2 and Ondansetron and Dexamethasone  Airway Management Planned: Oral ETT  Additional Equipment: None  Intra-op Plan:   Post-operative Plan: Extubation in OR  Informed Consent: I have reviewed the patients History and Physical, chart, labs and discussed the procedure including the risks, benefits and alternatives for the proposed anesthesia with the patient or authorized representative who has indicated his/her understanding and acceptance.   Dental advisory given  Plan Discussed with: CRNA and Surgeon  Anesthesia Plan Comments:         Anesthesia Quick Evaluation

## 2017-02-06 NOTE — Transfer of Care (Signed)
Immediate Anesthesia Transfer of Care Note  Patient: Edsel Shives  Procedure(s) Performed: Microlumbar decompression L5-S1 right (Right )  Patient Location: PACU  Anesthesia Type:General  Level of Consciousness: sedated  Airway & Oxygen Therapy: Patient Spontanous Breathing and Patient connected to face mask oxygen  Post-op Assessment: Report given to RN and Post -op Vital signs reviewed and stable  Post vital signs: Reviewed and stable  Last Vitals:  Vitals:   02/06/17 0642  BP: 128/73  Pulse: 68  Resp: 18  Temp: 37 C  SpO2: 98%    Last Pain:  Vitals:   02/06/17 0642  TempSrc: Oral      Patients Stated Pain Goal: 4 (02/06/17 1191)  Complications: No apparent anesthesia complications

## 2017-02-06 NOTE — Anesthesia Procedure Notes (Signed)
Date/Time: 02/06/2017 10:08 AM Performed by: Minerva Ends Pre-anesthesia Checklist: Suction available, Patient being monitored and Emergency Drugs available Oxygen Delivery Method: Simple face mask Placement Confirmation: breath sounds checked- equal and bilateral and positive ETCO2 Dental Injury: Teeth and Oropharynx as per pre-operative assessment  Comments: Extubated to face mask-- good AW to PACU O2 intact

## 2017-02-06 NOTE — Brief Op Note (Signed)
02/06/2017  9:54 AM  PATIENT:  Lance Hill  71 y.o. male  PRE-OPERATIVE DIAGNOSIS:  HNP Stenosis Decompression L5-S1 right  POST-OPERATIVE DIAGNOSIS:  HNP Stenosis Decompression L5-S1 right  PROCEDURE:  Procedure(s) with comments: Microlumbar decompression L5-S1 right (Right) - 90 mins  SURGEON:  Surgeon(s) and Role:    Jene Every, MD - Primary  PHYSICIAN ASSISTANT:   ASSISTANTS: Bissel   ANESTHESIA:   general  EBL:  Total I/O In: -  Out: 20 [Blood:20]  BLOOD ADMINISTERED:none  DRAINS: none   LOCAL MEDICATIONS USED:  MARCAINE     SPECIMEN:  No Specimen  DISPOSITION OF SPECIMEN:  N/A  COUNTS:  YES  TOURNIQUET:  * No tourniquets in log *  DICTATION: .Other Dictation: Dictation Number [id number]  PLAN OF CARE: Admit for overnight observation  PATIENT DISPOSITION:  PACU - hemodynamically stable.   Delay start of Pharmacological VTE agent (>24hrs) due to surgical blood loss or risk of bleeding: yes

## 2017-02-06 NOTE — Anesthesia Procedure Notes (Signed)
Procedure Name: Intubation Date/Time: 02/06/2017 8:44 AM Performed by: Minerva Ends Pre-anesthesia Checklist: Patient identified, Emergency Drugs available, Suction available and Patient being monitored Patient Re-evaluated:Patient Re-evaluated prior to induction Oxygen Delivery Method: Circle System Utilized Preoxygenation: Pre-oxygenation with 100% oxygen Induction Type: IV induction Ventilation: Mask ventilation without difficulty Laryngoscope Size: Miller and 2 Grade View: Grade I Tube type: Oral Number of attempts: 1 Airway Equipment and Method: Stylet Placement Confirmation: ETT inserted through vocal cords under direct vision,  positive ETCO2 and breath sounds checked- equal and bilateral Secured at: 22 cm Tube secured with: Tape Dental Injury: Teeth and Oropharynx as per pre-operative assessment  Comments: Smooth IV induction Moser-- intubation AM CRNA atraumatic-- teeth and mouth as preop--- missing and many chipped teeth --- bilat BS Moser

## 2017-02-06 NOTE — Interval H&P Note (Signed)
History and Physical Interval Note:  02/06/2017 8:30 AM  Lance Hill  has presented today for surgery, with the diagnosis of HNP Stenosis Decompression L5-S1 right  The various methods of treatment have been discussed with the patient and family. After consideration of risks, benefits and other options for treatment, the patient has consented to  Procedure(s) with comments: Microlumbar decompression L5-S1 right (Right) - 90 mins as a surgical intervention .  The patient's history has been reviewed, patient examined, no change in status, stable for surgery.  I have reviewed the patient's chart and labs.  Questions were answered to the patient's satisfaction.     Kathe Wirick C

## 2017-02-06 NOTE — Discharge Instructions (Signed)

## 2017-02-06 NOTE — Anesthesia Postprocedure Evaluation (Signed)
Anesthesia Post Note  Patient: Lance Hill  Procedure(s) Performed: Microlumbar decompression L5-S1 right (Right )     Patient location during evaluation: PACU Anesthesia Type: General Level of consciousness: awake and alert Pain management: pain level controlled Vital Signs Assessment: post-procedure vital signs reviewed and stable Respiratory status: spontaneous breathing, nonlabored ventilation, respiratory function stable and patient connected to nasal cannula oxygen Cardiovascular status: blood pressure returned to baseline and stable Postop Assessment: no apparent nausea or vomiting Anesthetic complications: no    Last Vitals:  Vitals:   02/06/17 1116 02/06/17 1134  BP:  (!) 111/58  Pulse:  77  Resp:  12  Temp: 36.4 C 36.5 C  SpO2:  100%    Last Pain:  Vitals:   02/06/17 1030  TempSrc:   PainSc: 0-No pain                 [name]

## 2017-02-07 DIAGNOSIS — I10 Essential (primary) hypertension: Secondary | ICD-10-CM | POA: Diagnosis not present

## 2017-02-07 DIAGNOSIS — M5117 Intervertebral disc disorders with radiculopathy, lumbosacral region: Secondary | ICD-10-CM | POA: Diagnosis not present

## 2017-02-07 DIAGNOSIS — M48061 Spinal stenosis, lumbar region without neurogenic claudication: Secondary | ICD-10-CM | POA: Diagnosis not present

## 2017-02-07 DIAGNOSIS — E119 Type 2 diabetes mellitus without complications: Secondary | ICD-10-CM | POA: Diagnosis not present

## 2017-02-07 DIAGNOSIS — K219 Gastro-esophageal reflux disease without esophagitis: Secondary | ICD-10-CM | POA: Diagnosis not present

## 2017-02-07 LAB — BASIC METABOLIC PANEL
Anion gap: 9 (ref 5–15)
BUN: 35 mg/dL — ABNORMAL HIGH (ref 6–20)
CHLORIDE: 103 mmol/L (ref 101–111)
CO2: 23 mmol/L (ref 22–32)
Calcium: 8.7 mg/dL — ABNORMAL LOW (ref 8.9–10.3)
Creatinine, Ser: 1.34 mg/dL — ABNORMAL HIGH (ref 0.61–1.24)
GFR calc non Af Amer: 51 mL/min — ABNORMAL LOW (ref >60)
GFR, EST AFRICAN AMERICAN: 59 mL/min — AB (ref >60)
Glucose, Bld: 160 mg/dL — ABNORMAL HIGH (ref 65–99)
POTASSIUM: 4.5 mmol/L (ref 3.5–5.1)
SODIUM: 135 mmol/L (ref 135–145)

## 2017-02-07 LAB — GLUCOSE, CAPILLARY
GLUCOSE-CAPILLARY: 149 mg/dL — AB (ref 65–99)
GLUCOSE-CAPILLARY: 163 mg/dL — AB (ref 65–99)

## 2017-02-07 MED ORDER — ADULT MULTIVITAMIN W/MINERALS CH
1.0000 | ORAL_TABLET | Freq: Every day | ORAL | Status: DC
Start: 2017-02-07 — End: 2017-06-27

## 2017-02-07 MED ORDER — NAPROXEN SODIUM 220 MG PO TABS: 220.0000 mg | ORAL_TABLET | Freq: Two times a day (BID) | ORAL | Status: DC | PRN

## 2017-02-07 MED ORDER — ASPIRIN EC 81 MG PO TBEC: 81.0000 mg | DELAYED_RELEASE_TABLET | Freq: Every day | ORAL | Status: DC

## 2017-02-07 NOTE — Op Note (Signed)
NAMEBOSTEN, NEWSTROM               ACCOUNT NO.:  [id number]  MEDICAL RECORD NO.:  [id number]  LOCATION:                                 FACILITY:  PHYSICIAN:  Jene Every, M.D.         DATE OF BIRTH:  DATE OF PROCEDURE:  02/06/2017 DATE OF DISCHARGE:                              OPERATIVE REPORT   PREOPERATIVE DIAGNOSIS:  Spinal stenosis, L5-S1 right with S1 radiculopathy.  POSTOPERATIVE DIAGNOSIS:  Spinal stenosis, L5-S1 right with S1 radiculopathy with epidural venous plexus.  PROCEDURES PERFORMED: 1. Microlumbar decompression, L5-S1, right. 2. Foraminotomies, L5-S1, right. 3. Lysis of epidural venous plexus, L5-S1, right.  ANESTHESIA:  General.  ASSISTANT:  Lanna Poche, PA.  HISTORY:  A 71 year old with S1 radiculopathy with persistent history of hip replacement.  He has had some ongoing issues with his hip and also had fairly defined S1 radiculopathy.  He had some lateral recess stenosis noted at L5-S1, and also had a temporary relief from diagnostic and S1 nerve root block on the right.  We discussed a microlumbar decompression at L5-S1 on the right with risks and benefits including bleeding, infection and damage to neurovascular structures, no change in symptoms, worsening symptoms, DVT, PE, anesthetic complications, etc.  TECHNIQUE:  With the patient in supine position after induction of adequate general anesthesia, 2 g of Kefzol, placed prone on the [place] frame.  All bony prominences were well padded.  Lumbar region was prepped and draped in usual sterile fashion.  Prior to that, care was taken to position the leg to keep the hip concentric.  After prepping and draping and utilizing 18-gauge needle to localize L5-1 interspace and confirming it with x-ray, we had made an incision from the spinous process to L5-S1.  Subcutaneous tissue was dissected, electrocautery was utilized to achieve hemostasis.  Dorsolumbar fascia and paraspinous muscles infiltrated  with 0.25% Marcaine with epinephrine.  We elevated the paraspinous musculature from the lamina at L5-S1.  McCullough retractor was placed, operating microscope was draped and brought on the surgical field.  Confirmatory radiograph obtained.  Microcurette utilized to detach the ligamentum flavum from the cephalad edge of S1. Hemilaminotomy of the caudad edge of L5 was performed to the point detaching the ligamentum flavum, cephalad, preserving the pars.  Patty placed beneath the ligamentum flavum.  Ligamentum flavum, which was hypertrophic, removed from the interspace.  S1 foraminotomy was performed again protecting the S1 nerve root.  We gently mobilized it medially and there was an extensive epidural venous plexus tethering the S1 nerve root to the disk as well as to the lateral recess.  There was facet hypertrophy noted as well, particularly around the pedicle of 5. With the neural elements well protected, decompressed the lateral recess to the medial border of the pedicle, protecting the L5 and the S1 nerve roots.  Undercut the facet, performed foraminotomies of 5.  We then clearly identified the nerve roots and the vascular release.  We cauterized the vascular release and divided it.  Improving the mobility of the S1 nerve root.  I fully inspected the disk, no disk herniation was noted.  We checked beneath the thecal sac, the axilla, the shoulder  of the S1 nerve root at the foramen of L5 and S1.  No disk herniation or neural compressive lesion was noted.  1 cm of excursion medial to pedicle without tension.  Confirmatory radiograph obtained at the level. I felt his pathology was a combination of the stenosis to the facet hypertrophy as well as the epidural venous plexus tethering the root. Following this, copiously irrigated the wound.  We used thrombin-soaked Gelfoam and then removed it.  No active bleeding or CSF leakage.  We draped the epidural fat over the S1 nerve root and then  thrombin-soaked Gelfoam on top of that.  We removed the [facility] retractor, irrigated the paraspinous musculature, no active bleeding.  We closed the dorsolumbar fascia with 1 Vicryl, subcu with 2 and skin with Prolene. Sterile dressing applied.  Placed supine on the hospital bed, extubated without difficulty and transported to the recovery room in satisfactory condition.  The patient tolerated the procedure well.  No complications.  Assistant, Lanna Poche, [place].  Minimal blood loss.     Jene Every, M.D.   ______________________________ Jene Every, M.D.    Cordelia Pen  D:  02/06/2017  T:  02/07/2017  Job:  [id number]

## 2017-02-07 NOTE — Progress Notes (Signed)
02/07/17 Nursing 0550  Dr Rennis Chris returned a page regarding bp of 87/53 this morning. No orders received.

## 2017-02-07 NOTE — Discharge Summary (Signed)
Physician Discharge Summary   Patient ID: Lance Hill MRN: [id number] DOB/AGE: 08/30/1945 71 y.o.  Admit date: 02/06/2017 Discharge date: 02/07/2017  Primary Diagnosis:   HNP Stenosis Decompression L5-S1 right  Admission Diagnoses:  Past Medical History:  Diagnosis Date  . Arthritis   . Diabetes mellitus without complication ([place])    type II   . GERD (gastroesophageal reflux disease)   . History of kidney stones    hx of    Discharge Diagnoses:   Principal Problem:   Spinal stenosis of lumbar region Active Problems:   Spinal stenosis, lumbar  Procedure:  Procedure(s) (LRB): Microlumbar decompression L5-S1 right (Right)   Consults: None  HPI:  see H&P    Laboratory Data: Hospital Outpatient Visit on 01/29/2017  Component Date Value Ref Range Status  . Sodium 01/29/2017 140  135 - 145 mmol/L Final  . Potassium 01/29/2017 4.4  3.5 - 5.1 mmol/L Final  . Chloride 01/29/2017 107  101 - 111 mmol/L Final  . CO2 01/29/2017 26  22 - 32 mmol/L Final  . Glucose, Bld 01/29/2017 159* 65 - 99 mg/dL Final  . BUN 01/29/2017 27* 6 - 20 mg/dL Final  . Creatinine, Ser 01/29/2017 1.13  0.61 - 1.24 mg/dL Final  . Calcium 01/29/2017 9.7  8.9 - 10.3 mg/dL Final  . GFR calc non Af Amer 01/29/2017 >60  >60 mL/min Final  . GFR calc Af Amer 01/29/2017 >60  >60 mL/min Final   Comment: (NOTE) The eGFR has been calculated using the CKD EPI equation. This calculation has not been validated in all clinical situations. eGFR's persistently <60 mL/min signify possible Chronic Kidney Disease.   . Anion gap 01/29/2017 7  5 - 15 Final  . WBC 01/29/2017 5.4  4.0 - 10.5 K/uL Final  . RBC 01/29/2017 4.13* 4.22 - 5.81 MIL/uL Final  . Hemoglobin 01/29/2017 12.2* 13.0 - 17.0 g/dL Final  . HCT 01/29/2017 37.4* 39.0 - 52.0 % Final  . MCV 01/29/2017 90.6  78.0 - 100.0 fL Final  . MCH 01/29/2017 29.5  26.0 - 34.0 pg Final  . MCHC 01/29/2017 32.6  30.0 - 36.0 g/dL Final  . RDW 01/29/2017 13.4  11.5 -  15.5 % Final  . Platelets 01/29/2017 169  150 - 400 K/uL Final  . MRSA, PCR 01/29/2017 NEGATIVE  NEGATIVE Final  . Staphylococcus aureus 01/29/2017 NEGATIVE  NEGATIVE Final   Comment: (NOTE) The Xpert SA Assay (FDA approved for NASAL specimens in patients 50 years of age and older), is one component of a comprehensive surveillance program. It is not intended to diagnose infection nor to guide or monitor treatment.   . Hgb A1c MFr Bld 01/29/2017 6.7* 4.8 - 5.6 % Final   Comment: (NOTE) Pre diabetes:          5.7%-6.4% Diabetes:              >6.4% Glycemic control for   <7.0% adults with diabetes   . Mean Plasma Glucose 01/29/2017 145.59  mg/dL Final   Performed at [facility] [address], [place], [place] [address]  . Glucose-Capillary 01/29/2017 165* 65 - 99 mg/dL Final   No results for input(s): HGB in the last 72 hours. No results for input(s): WBC, RBC, HCT, PLT in the last 72 hours.  Recent Labs  02/07/17 0517  NA 135  K 4.5  CL 103  CO2 23  BUN 35*  CREATININE 1.34*  GLUCOSE 160*  CALCIUM 8.7*   No results  for input(s): LABPT, INR in the last 72 hours.  X-Rays:Dg Lumbar Spine 2-3 Views  Result Date: 01/29/2017 CLINICAL DATA:  Preop for lumbar spine surgery, chronic low back pain EXAM: LUMBAR SPINE - 2-3 VIEW COMPARISON:  None. FINDINGS: There is mild curvature of the thoracolumbar spine. Degenerative disc disease is noted diffusely particularly at L1-2, L2-3, and L3-4 levels where there is loss of disc space and sclerosis with spurring. No compression deformity is seen. The SI joints are corticated. Right total hip replacement is noted. IMPRESSION: Mild curvature of thoracolumbar spine with diffuse degenerative disc disease. No acute abnormality. Electronically Signed   By: Ivar Drape M.D.   On: 01/29/2017 12:22   Dg Spine Portable 1 View  Result Date: 02/06/2017 CLINICAL DATA:  Back pain.  Surgical level L5-S1. EXAM: PORTABLE SPINE - 1 VIEW COMPARISON:   Plain films 01/29/2017. Portable radiograph earlier today. FINDINGS: Intraoperative portable image 3 demonstrates a probe directed most closely toward the inferior aspect of the L5 vertebrae, overlying the L5-S1 foramen, just superior to the L5-S1 interspace. IMPRESSION: Inferior aspect of L5 marked with a probe. Electronically Signed   By: Staci Righter M.D.   On: 02/06/2017 09:53   Dg Spine Portable 1 View  Result Date: 02/06/2017 CLINICAL DATA:  Lumbar disc disease. EXAM: PORTABLE SPINE - 1 VIEW COMPARISON:  Radiographs dated 01/29/2017 FINDINGS: Image number 2 demonstrates instruments at the L5-S1 level. IMPRESSION: Instruments at L5-S1. Electronically Signed   By: Lorriane Shire M.D.   On: 02/06/2017 09:28   Dg Spine Portable 1 View  Result Date: 02/06/2017 CLINICAL DATA:  Initial preoperative lateral portable radiograph prior to L5-S1 procedure. EXAM: PORTABLE SPINE - 1 VIEW COMPARISON:  Lumbar spine AP and lateral views of January 29, 2017 FINDINGS: There are 5 lumbar type vertebral bodies present. There is moderate disc space narrowing at L1-2 and L3-4 with milder narrowing at L2-3, L4-5, and L5-S1. There is no spondylolisthesis. Anterior near bridging osteophytes are present at L2-3 and L3-4. IMPRESSION: Multilevel degenerative disc disease of the lumbar spine. The spinous processes were numbered for purposes of surgical planning. Electronically Signed   By: David  Martinique M.D.   On: 02/06/2017 09:11    EKG:No orders found for this or any previous visit.   Hospital Course: Patient was admitted to [facility] and taken to the OR and underwent the above state procedure without complications.  Patient tolerated the procedure well and was later transferred to the recovery room and then to the orthopaedic floor for postoperative care.  They were given PO and IV analgesics for pain control following their surgery.  They were given 24 hours of postoperative antibiotics.   PT was consulted  postop to assist with mobility and transfers.  The patient was allowed to be WBAT with therapy and was taught back precautions. Discharge planning was consulted to help with postop disposition and equipment needs.  Patient had a good night on the evening of surgery and started to get up OOB with therapy on day one. Patient was seen in rounds and was ready to go home on day one.  They were given discharge instructions and dressing directions.  They were instructed on when to follow up in the office with Dr. Tonita Cong.   Diet: Diabetic diet Activity:WBAT Follow-up:in 10-14 days Disposition - Home Discharged Condition: good   Discharge Instructions    Call MD / Call 911    Complete by:  As directed    If you experience chest  pain or shortness of breath, CALL 911 and be transported to the hospital emergency room.  If you develope a fever above 101 F, pus (white drainage) or increased drainage or redness at the wound, or calf pain, call your surgeon's office.   Constipation Prevention    Complete by:  As directed    Drink plenty of fluids.  Prune juice may be helpful.  You may use a stool softener, such as Colace (over the counter) 100 mg twice a day.  Use MiraLax (over the counter) for constipation as needed.   Diet - low sodium heart healthy    Complete by:  As directed    Increase activity slowly as tolerated    Complete by:  As directed      Allergies as of 02/07/2017   No Known Allergies     Medication List    STOP taking these medications   Co Q 10 100 MG Caps   Fish Oil 1200 MG Caps   TURMERIC PO     TAKE these medications   acetaminophen 325 MG tablet Commonly known as:  TYLENOL Take 650 mg by mouth every 6 (six) hours as needed for moderate pain.   amLODipine 10 MG tablet Commonly known as:  NORVASC Take 10 mg by mouth daily.   aspirin EC 81 MG tablet Take 1 tablet (81 mg total) by mouth daily. Resume 4 days post-op What changed:  additional instructions     Brinzolamide-Brimonidine 1-0.2 % Susp Place 1 drop into both eyes 3 (three) times daily.   [employer] VITAMIN D3 1000 units capsule Generic drug:  Cholecalciferol Take 2,000 Units by mouth daily.   docusate sodium 100 MG capsule Commonly known as:  COLACE Take 1 capsule (100 mg total) by mouth 2 (two) times daily as needed for mild constipation.   gabapentin 300 MG capsule Commonly known as:  NEURONTIN Take 300 mg by mouth 2 (two) times daily.   glyBURIDE 5 MG tablet Commonly known as:  DIABETA Take 5 mg by mouth 2 (two) times daily with a meal.   hydrochlorothiazide 25 MG tablet Commonly known as:  HYDRODIURIL Take 12.5 mg by mouth daily.   loratadine 10 MG tablet Commonly known as:  CLARITIN Take 10 mg by mouth daily.   losartan 100 MG tablet Commonly known as:  COZAAR Take 100 mg by mouth daily.   magnesium oxide 400 MG tablet Commonly known as:  MAG-OX Take 400 mg by mouth daily.   metFORMIN 500 MG tablet Commonly known as:  GLUCOPHAGE Take 500 mg by mouth 2 (two) times daily with a meal.   multivitamin with minerals Tabs tablet Take 1 tablet by mouth daily. Resume 5 days post-op What changed:  additional instructions   naproxen sodium 220 MG tablet Commonly known as:  ANAPROX Take 1 tablet (220 mg total) by mouth 2 (two) times daily as needed (only takes for back pain when mowing). Resume 5 days post-op as needed What changed:  additional instructions   pioglitazone 45 MG tablet Commonly known as:  ACTOS Take 45 mg by mouth daily.   polyethylene glycol packet Commonly known as:  MIRALAX / GLYCOLAX Take 17 g by mouth daily.   ranitidine 300 MG tablet Commonly known as:  ZANTAC Take 150 mg by mouth at bedtime.   simvastatin 20 MG tablet Commonly known as:  ZOCOR Take 20 mg by mouth daily at 6 PM.   traMADol 50 MG tablet Commonly known as:  ULTRAM Take 1 tablet (  50 mg total) by mouth every 6 (six) hours as needed. What changed:  reasons to take this       Follow-up Information    Susa Day, MD Follow up in 2 week(s).   Specialty:  Orthopedic Surgery Contact information: [address] [place] [address] [phone]           Signed: Lacie Draft, PA-C Orthopaedic Surgery 02/07/2017, 10:09 AM

## 2017-02-07 NOTE — Progress Notes (Signed)
Subjective: 1 Day Post-Op Procedure(s) (LRB): Microlumbar decompression L5-S1 right (Right) Patient reports pain as 3 on 0-10 scale.   Less leg pain.  Objective: Vital signs in last 24 hours: Temp:  [97.6 F (36.4 C)-98.3 F (36.8 C)] 98.1 F (36.7 C) (10/11 0523) Pulse Rate:  [68-96] 69 (10/11 0523) Resp:  [10-18] 17 (10/11 0523) BP: (87-130)/(43-74) 87/53 (10/11 0523) SpO2:  [99 %-100 %] 100 % (10/11 0523)  Intake/Output from previous day: 10/10 0701 - 10/11 0700 In: 2782.5 [P.O.:900; I.V.:1727.5; IV Piggyback:155] Out: 1570 [Urine:1550; Blood:20] Intake/Output this shift: No intake/output data recorded.  No results for input(s): HGB in the last 72 hours. No results for input(s): WBC, RBC, HCT, PLT in the last 72 hours.  Recent Labs  02/07/17 0517  NA 135  K 4.5  CL 103  CO2 23  BUN 35*  CREATININE 1.34*  GLUCOSE 160*  CALCIUM 8.7*   No results for input(s): LABPT, INR in the last 72 hours.  Neurologically intact Neurovascular intact Sensation intact distally Dorsiflexion/Plantar flexion intact Incision: dressing C/D/I No DVT  Assessment/Plan: 1 Day Post-Op Procedure(s) (LRB): Microlumbar decompression L5-S1 right (Right) Advance diet Up with therapy D/C IV fluids Discharge home with home health  Cr sl elevated.Voiding well. Discussed OR. Also op Diet Lance Hill C 02/07/2017, 7:17 AM

## 2017-02-07 NOTE — Evaluation (Signed)
Physical Therapy Evaluation Patient Details Name: Lance Hill MRN: [id number] DOB: 11/29/1945 Today's Date: 02/07/2017   History of Present Illness  L5-S1 decompression  Clinical Impression  Pt s/p back surgery and presents with functional mobility limitations 2* post op pain and back precautions.  Pt should progress to dc home with family assist.    Follow Up Recommendations No PT follow up    Equipment Recommendations  None recommended by PT    Recommendations for Other Services OT consult     Precautions / Restrictions Precautions Precautions: Fall;Back Precaution Booklet Issued: Yes (comment) Restrictions Weight Bearing Restrictions: No      Mobility  Bed Mobility Overal bed mobility: Needs Assistance Bed Mobility: Sidelying to Sit;Sit to Sidelying;Rolling Rolling: Supervision Sidelying to sit: Supervision     Sit to sidelying: Supervision General bed mobility comments: cues for back precautions/sequence  Transfers Overall transfer level: Needs assistance Equipment used: None Transfers: Sit to/from Stand Sit to Stand: Supervision         General transfer comment: cues for back precautions  Ambulation/Gait Ambulation/Gait assistance: Min assist;Min guard;Supervision Ambulation Distance (Feet): 400 Feet Assistive device: None;1 person hand held assist Gait Pattern/deviations: Step-through pattern;Decreased step length - right;Decreased step length - left;Shuffle;Staggering left;Staggering right Gait velocity: mod pace   General Gait Details: Pt unsteady on feet with drift to L and R.  Pt states this is his norm and he is reluctant to utilize RW but has one at home  Stairs            Wheelchair Mobility    Modified Rankin (Stroke Patients Only)       Balance Overall balance assessment: Needs assistance Sitting-balance support: No upper extremity supported;Feet supported Sitting balance-Leahy Scale: Good     Standing balance support: No  upper extremity supported Standing balance-Leahy Scale: Good                               Pertinent Vitals/Pain Pain Assessment: Faces Faces Pain Scale: Hurts a little bit Pain Location: back Pain Descriptors / Indicators: Discomfort;Sore Pain Intervention(s): Limited activity within patient's tolerance;Monitored during session;Repositioned;Premedicated before session    Home Living Family/patient expects to be discharged to:: Private residence Living Arrangements: Spouse/significant other Available Help at Discharge: Family Type of Home: House Home Access: Stairs to enter Entrance Stairs-Rails: None Entrance Stairs-Number of Steps: 2 Home Layout: One level Home Equipment: Environmental consultant - 2 wheels;Cane - single point;Bedside commode      Prior Function Level of Independence: Independent with assistive device(s);Independent         Comments: cane used in community with crowd and extra time for socks     Hand Dominance        Extremity/Trunk Assessment   Upper Extremity Assessment Upper Extremity Assessment: Overall WFL for tasks assessed    Lower Extremity Assessment Lower Extremity Assessment: Overall WFL for tasks assessed       Communication   Communication: No difficulties  Cognition Arousal/Alertness: Awake/alert Behavior During Therapy: WFL for tasks assessed/performed Overall Cognitive Status: Within Functional Limits for tasks assessed                                        General Comments      Exercises General Exercises - Lower Extremity Ankle Circles/Pumps: AROM;Both;20 reps;Supine   Assessment/Plan    PT Assessment Patient  needs continued PT services  PT Problem List Decreased activity tolerance;Decreased balance;Decreased mobility;Decreased knowledge of use of DME;Pain;Obesity       PT Treatment Interventions DME instruction;Gait training;Stair training;Functional mobility training;Therapeutic  activities;Therapeutic exercise;Patient/family education    PT Goals (Current goals can be found in the Care Plan section)  Acute Rehab PT Goals Patient Stated Goal: home today PT Goal Formulation: With patient Time For Goal Achievement: 02/09/17 Potential to Achieve Goals: Good    Frequency 7X/week   Barriers to discharge        Co-evaluation               AM-PAC PT "6 Clicks" Daily Activity  Outcome Measure Difficulty turning over in bed (including adjusting bedclothes, sheets and blankets)?: A Little Difficulty moving from lying on back to sitting on the side of the bed? : A Lot Difficulty sitting down on and standing up from a chair with arms (e.g., wheelchair, bedside commode, etc,.)?: A Little Help needed moving to and from a bed to chair (including a wheelchair)?: A Little Help needed walking in hospital room?: A Little Help needed climbing 3-5 steps with a railing? : A Little 6 Click Score: 17    End of Session   Activity Tolerance: Patient tolerated treatment well Patient left: in chair;with call bell/phone within reach;with family/visitor present Nurse Communication: Mobility status PT Visit Diagnosis: Unsteadiness on feet (R26.81);Difficulty in walking, not elsewhere classified (R26.2)    Time: 1610-9604 PT Time Calculation (min) (ACUTE ONLY): 35 min   Charges:   PT Evaluation $PT Eval Low Complexity: 1 Low     PT G Codes:   PT G-Codes **NOT FOR INPATIENT CLASS** Functional Assessment Tool Used: Clinical judgement Functional Limitation: Mobility: Walking and moving around Mobility: Walking and Moving Around Current Status (V4098): At least 20 percent but less than 40 percent impaired, limited or restricted Mobility: Walking and Moving Around Goal Status [phone]): At least 1 percent but less than 20 percent impaired, limited or restricted    Pg [phone]   Andee Chivers 02/07/2017, 12:11 PM

## 2017-02-07 NOTE — Evaluation (Signed)
Occupational Therapy Evaluation Patient Details Name: Lance Hill MRN: [id number] DOB: 24-Sep-1945 Today's Date: 02/07/2017    History of Present Illness L5-S1 decompression   Clinical Impression   This 71 year old man was admitted for the above sx. All education was completed. No further OT is needed at this time    Follow Up Recommendations  No OT follow up    Equipment Recommendations  None recommended by OT    Recommendations for Other Services       Precautions / Restrictions Precautions Precautions: Fall;Back Restrictions Weight Bearing Restrictions: No      Mobility Bed Mobility Overal bed mobility: Needs Assistance Bed Mobility: Sidelying to Sit;Sit to Sidelying;Rolling Rolling: Supervision Sidelying to sit: Supervision     Sit to sidelying: Supervision General bed mobility comments: cues for back precautions/sequence  Transfers Overall transfer level: Needs assistance Equipment used: None Transfers: Sit to/from Stand Sit to Stand: Supervision         General transfer comment: cues for back precautions    Balance                                           ADL either performed or assessed with clinical judgement   ADL Overall ADL's : Needs assistance/impaired Eating/Feeding: Independent   Grooming: Supervision/safety;Standing   Upper Body Bathing: Set up;Sitting;Supervision/ safety   Lower Body Bathing: Moderate assistance;Sit to/from stand   Upper Body Dressing : Set up;Sitting   Lower Body Dressing: Maximal assistance;Sit to/from stand   Toilet Transfer: Min guard;Ambulation;Comfort height toilet   Toileting- Clothing Manipulation and Hygiene: Minimal assistance;Sit to/from stand         General ADL Comments: educated on adls and back precautions.  Pt tends to move quickly. Cues for back precautions     Vision         Perception     Praxis      Pertinent Vitals/Pain Pain Assessment: Faces Faces Pain  Scale: Hurts a little bit Pain Location: back Pain Descriptors / Indicators: Discomfort;Sore Pain Intervention(s): Limited activity within patient's tolerance;Monitored during session;Repositioned;Premedicated before session     Hand Dominance     Extremity/Trunk Assessment Upper Extremity Assessment Upper Extremity Assessment: Overall WFL for tasks assessed           Communication Communication Communication: No difficulties   Cognition Arousal/Alertness: Awake/alert Behavior During Therapy: WFL for tasks assessed/performed Overall Cognitive Status: Within Functional Limits for tasks assessed                                     General Comments       Exercises     Shoulder Instructions      Home Living Family/patient expects to be discharged to:: Private residence Living Arrangements: Spouse/significant other     Home Access: Stairs to enter Secretary/administrator of Steps: 2 Entrance Stairs-Rails: None       Bathroom Shower/Tub: Producer, television/film/video: Handicapped height     Home Equipment: Environmental consultant - 2 wheels;Cane - single point          Prior Functioning/Environment Level of Independence: Independent with assistive device(s)        Comments: cane used in community with crowd and extra time for socks        OT Problem List:  OT Treatment/Interventions:      OT Goals(Current goals can be found in the care plan section) Acute Rehab OT Goals Patient Stated Goal: home today OT Goal Formulation: All assessment and education complete, DC therapy  OT Frequency:     Barriers to D/C:            Co-evaluation              AM-PAC PT "6 Clicks" Daily Activity     Outcome Measure Help from another person eating meals?: None Help from another person taking care of personal grooming?: A Little Help from another person toileting, which includes using toliet, bedpan, or urinal?: A Lot Help from another person  bathing (including washing, rinsing, drying)?: A Little Help from another person to put on and taking off regular upper body clothing?: A Little Help from another person to put on and taking off regular lower body clothing?: A Lot 6 Click Score: 17   End of Session    Activity Tolerance: Patient tolerated treatment well Patient left: in chair;with call bell/phone within reach;with family/visitor present  OT Visit Diagnosis: Muscle weakness (generalized) (M62.81)                Time: 1610-9604 OT Time Calculation (min): 30 min Charges:  OT General Charges $OT Visit: 1 Visit OT Evaluation $OT Eval Low Complexity: 1 Low G-Codes: OT G-codes **NOT FOR INPATIENT CLASS** Functional Assessment Tool Used: AM-PAC 6 Clicks Daily Activity;Clinical judgement Functional Limitation: Self care Self Care Current Status (V4098): At least 60 percent but less than 80 percent impaired, limited or restricted Self Care Goal Status (J1914): At least 60 percent but less than 80 percent impaired, limited or restricted Self Care Discharge Status [phone]): At least 60 percent but less than 80 percent impaired, limited or restricted   Marica Otter, OTR/L [phone] 02/07/2017  Alen Matheson 02/07/2017, 9:22 AM

## 2017-02-20 DIAGNOSIS — Z9889 Other specified postprocedural states: Secondary | ICD-10-CM | POA: Diagnosis not present

## 2017-02-20 DIAGNOSIS — Z4789 Encounter for other orthopedic aftercare: Secondary | ICD-10-CM | POA: Diagnosis not present

## 2017-02-25 DIAGNOSIS — M62551 Muscle wasting and atrophy, not elsewhere classified, right thigh: Secondary | ICD-10-CM | POA: Diagnosis not present

## 2017-02-25 DIAGNOSIS — M6281 Muscle weakness (generalized): Secondary | ICD-10-CM | POA: Diagnosis not present

## 2017-02-25 DIAGNOSIS — M545 Low back pain: Secondary | ICD-10-CM | POA: Diagnosis not present

## 2017-02-27 DIAGNOSIS — M6281 Muscle weakness (generalized): Secondary | ICD-10-CM | POA: Diagnosis not present

## 2017-02-27 DIAGNOSIS — M62551 Muscle wasting and atrophy, not elsewhere classified, right thigh: Secondary | ICD-10-CM | POA: Diagnosis not present

## 2017-02-27 DIAGNOSIS — M545 Low back pain: Secondary | ICD-10-CM | POA: Diagnosis not present

## 2017-03-01 DIAGNOSIS — M545 Low back pain: Secondary | ICD-10-CM | POA: Diagnosis not present

## 2017-03-01 DIAGNOSIS — M6281 Muscle weakness (generalized): Secondary | ICD-10-CM | POA: Diagnosis not present

## 2017-03-01 DIAGNOSIS — M62551 Muscle wasting and atrophy, not elsewhere classified, right thigh: Secondary | ICD-10-CM | POA: Diagnosis not present

## 2017-03-04 DIAGNOSIS — M545 Low back pain: Secondary | ICD-10-CM | POA: Diagnosis not present

## 2017-03-04 DIAGNOSIS — M62551 Muscle wasting and atrophy, not elsewhere classified, right thigh: Secondary | ICD-10-CM | POA: Diagnosis not present

## 2017-03-04 DIAGNOSIS — M6281 Muscle weakness (generalized): Secondary | ICD-10-CM | POA: Diagnosis not present

## 2017-03-06 DIAGNOSIS — M62551 Muscle wasting and atrophy, not elsewhere classified, right thigh: Secondary | ICD-10-CM | POA: Diagnosis not present

## 2017-03-06 DIAGNOSIS — M6281 Muscle weakness (generalized): Secondary | ICD-10-CM | POA: Diagnosis not present

## 2017-03-06 DIAGNOSIS — M545 Low back pain: Secondary | ICD-10-CM | POA: Diagnosis not present

## 2017-03-08 DIAGNOSIS — M545 Low back pain: Secondary | ICD-10-CM | POA: Diagnosis not present

## 2017-03-08 DIAGNOSIS — M6281 Muscle weakness (generalized): Secondary | ICD-10-CM | POA: Diagnosis not present

## 2017-03-08 DIAGNOSIS — M62551 Muscle wasting and atrophy, not elsewhere classified, right thigh: Secondary | ICD-10-CM | POA: Diagnosis not present

## 2017-03-11 DIAGNOSIS — M6281 Muscle weakness (generalized): Secondary | ICD-10-CM | POA: Diagnosis not present

## 2017-03-11 DIAGNOSIS — M545 Low back pain: Secondary | ICD-10-CM | POA: Diagnosis not present

## 2017-03-11 DIAGNOSIS — M62551 Muscle wasting and atrophy, not elsewhere classified, right thigh: Secondary | ICD-10-CM | POA: Diagnosis not present

## 2017-03-13 DIAGNOSIS — M6281 Muscle weakness (generalized): Secondary | ICD-10-CM | POA: Diagnosis not present

## 2017-03-13 DIAGNOSIS — M545 Low back pain: Secondary | ICD-10-CM | POA: Diagnosis not present

## 2017-03-13 DIAGNOSIS — M62551 Muscle wasting and atrophy, not elsewhere classified, right thigh: Secondary | ICD-10-CM | POA: Diagnosis not present

## 2017-03-15 DIAGNOSIS — M6281 Muscle weakness (generalized): Secondary | ICD-10-CM | POA: Diagnosis not present

## 2017-03-15 DIAGNOSIS — M545 Low back pain: Secondary | ICD-10-CM | POA: Diagnosis not present

## 2017-03-15 DIAGNOSIS — M62551 Muscle wasting and atrophy, not elsewhere classified, right thigh: Secondary | ICD-10-CM | POA: Diagnosis not present

## 2017-03-18 DIAGNOSIS — M545 Low back pain: Secondary | ICD-10-CM | POA: Diagnosis not present

## 2017-03-18 DIAGNOSIS — M62551 Muscle wasting and atrophy, not elsewhere classified, right thigh: Secondary | ICD-10-CM | POA: Diagnosis not present

## 2017-03-18 DIAGNOSIS — M6281 Muscle weakness (generalized): Secondary | ICD-10-CM | POA: Diagnosis not present

## 2017-03-20 DIAGNOSIS — M545 Low back pain: Secondary | ICD-10-CM | POA: Diagnosis not present

## 2017-03-20 DIAGNOSIS — M62551 Muscle wasting and atrophy, not elsewhere classified, right thigh: Secondary | ICD-10-CM | POA: Diagnosis not present

## 2017-03-20 DIAGNOSIS — M6281 Muscle weakness (generalized): Secondary | ICD-10-CM | POA: Diagnosis not present

## 2017-03-25 DIAGNOSIS — M545 Low back pain: Secondary | ICD-10-CM | POA: Diagnosis not present

## 2017-03-25 DIAGNOSIS — M6281 Muscle weakness (generalized): Secondary | ICD-10-CM | POA: Diagnosis not present

## 2017-03-25 DIAGNOSIS — M62551 Muscle wasting and atrophy, not elsewhere classified, right thigh: Secondary | ICD-10-CM | POA: Diagnosis not present

## 2017-03-27 DIAGNOSIS — M545 Low back pain: Secondary | ICD-10-CM | POA: Diagnosis not present

## 2017-03-27 DIAGNOSIS — M6281 Muscle weakness (generalized): Secondary | ICD-10-CM | POA: Diagnosis not present

## 2017-03-27 DIAGNOSIS — M62551 Muscle wasting and atrophy, not elsewhere classified, right thigh: Secondary | ICD-10-CM | POA: Diagnosis not present

## 2017-03-29 DIAGNOSIS — M6281 Muscle weakness (generalized): Secondary | ICD-10-CM | POA: Diagnosis not present

## 2017-03-29 DIAGNOSIS — M62551 Muscle wasting and atrophy, not elsewhere classified, right thigh: Secondary | ICD-10-CM | POA: Diagnosis not present

## 2017-03-29 DIAGNOSIS — Z4789 Encounter for other orthopedic aftercare: Secondary | ICD-10-CM | POA: Diagnosis not present

## 2017-03-29 DIAGNOSIS — M545 Low back pain: Secondary | ICD-10-CM | POA: Diagnosis not present

## 2017-04-01 DIAGNOSIS — M6281 Muscle weakness (generalized): Secondary | ICD-10-CM | POA: Diagnosis not present

## 2017-04-01 DIAGNOSIS — M545 Low back pain: Secondary | ICD-10-CM | POA: Diagnosis not present

## 2017-04-01 DIAGNOSIS — M62551 Muscle wasting and atrophy, not elsewhere classified, right thigh: Secondary | ICD-10-CM | POA: Diagnosis not present

## 2017-04-03 DIAGNOSIS — M6281 Muscle weakness (generalized): Secondary | ICD-10-CM | POA: Diagnosis not present

## 2017-04-03 DIAGNOSIS — M62551 Muscle wasting and atrophy, not elsewhere classified, right thigh: Secondary | ICD-10-CM | POA: Diagnosis not present

## 2017-04-03 DIAGNOSIS — M545 Low back pain: Secondary | ICD-10-CM | POA: Diagnosis not present

## 2017-04-05 DIAGNOSIS — M6281 Muscle weakness (generalized): Secondary | ICD-10-CM | POA: Diagnosis not present

## 2017-04-05 DIAGNOSIS — M545 Low back pain: Secondary | ICD-10-CM | POA: Diagnosis not present

## 2017-04-05 DIAGNOSIS — M62551 Muscle wasting and atrophy, not elsewhere classified, right thigh: Secondary | ICD-10-CM | POA: Diagnosis not present

## 2017-04-09 DIAGNOSIS — M6281 Muscle weakness (generalized): Secondary | ICD-10-CM | POA: Diagnosis not present

## 2017-04-09 DIAGNOSIS — M62551 Muscle wasting and atrophy, not elsewhere classified, right thigh: Secondary | ICD-10-CM | POA: Diagnosis not present

## 2017-04-09 DIAGNOSIS — M545 Low back pain: Secondary | ICD-10-CM | POA: Diagnosis not present

## 2017-04-11 DIAGNOSIS — M62551 Muscle wasting and atrophy, not elsewhere classified, right thigh: Secondary | ICD-10-CM | POA: Diagnosis not present

## 2017-04-11 DIAGNOSIS — M545 Low back pain: Secondary | ICD-10-CM | POA: Diagnosis not present

## 2017-04-11 DIAGNOSIS — M6281 Muscle weakness (generalized): Secondary | ICD-10-CM | POA: Diagnosis not present

## 2017-04-12 DIAGNOSIS — M62551 Muscle wasting and atrophy, not elsewhere classified, right thigh: Secondary | ICD-10-CM | POA: Diagnosis not present

## 2017-04-12 DIAGNOSIS — M25551 Pain in right hip: Secondary | ICD-10-CM | POA: Diagnosis not present

## 2017-04-12 DIAGNOSIS — M6281 Muscle weakness (generalized): Secondary | ICD-10-CM | POA: Diagnosis not present

## 2017-04-12 DIAGNOSIS — M545 Low back pain: Secondary | ICD-10-CM | POA: Diagnosis not present

## 2017-04-12 DIAGNOSIS — M1611 Unilateral primary osteoarthritis, right hip: Secondary | ICD-10-CM | POA: Diagnosis not present

## 2017-04-15 DIAGNOSIS — M6281 Muscle weakness (generalized): Secondary | ICD-10-CM | POA: Diagnosis not present

## 2017-04-15 DIAGNOSIS — M62551 Muscle wasting and atrophy, not elsewhere classified, right thigh: Secondary | ICD-10-CM | POA: Diagnosis not present

## 2017-04-15 DIAGNOSIS — M545 Low back pain: Secondary | ICD-10-CM | POA: Diagnosis not present

## 2017-04-17 DIAGNOSIS — M545 Low back pain: Secondary | ICD-10-CM | POA: Diagnosis not present

## 2017-04-17 DIAGNOSIS — M6281 Muscle weakness (generalized): Secondary | ICD-10-CM | POA: Diagnosis not present

## 2017-04-17 DIAGNOSIS — M62551 Muscle wasting and atrophy, not elsewhere classified, right thigh: Secondary | ICD-10-CM | POA: Diagnosis not present

## 2017-04-19 DIAGNOSIS — M62551 Muscle wasting and atrophy, not elsewhere classified, right thigh: Secondary | ICD-10-CM | POA: Diagnosis not present

## 2017-04-19 DIAGNOSIS — M545 Low back pain: Secondary | ICD-10-CM | POA: Diagnosis not present

## 2017-04-19 DIAGNOSIS — M6281 Muscle weakness (generalized): Secondary | ICD-10-CM | POA: Diagnosis not present

## 2017-04-20 DIAGNOSIS — M6281 Muscle weakness (generalized): Secondary | ICD-10-CM | POA: Diagnosis not present

## 2017-04-20 DIAGNOSIS — M545 Low back pain: Secondary | ICD-10-CM | POA: Diagnosis not present

## 2017-04-20 DIAGNOSIS — M62551 Muscle wasting and atrophy, not elsewhere classified, right thigh: Secondary | ICD-10-CM | POA: Diagnosis not present

## 2017-04-29 DIAGNOSIS — M6281 Muscle weakness (generalized): Secondary | ICD-10-CM | POA: Diagnosis not present

## 2017-04-29 DIAGNOSIS — M545 Low back pain: Secondary | ICD-10-CM | POA: Diagnosis not present

## 2017-04-29 DIAGNOSIS — M62551 Muscle wasting and atrophy, not elsewhere classified, right thigh: Secondary | ICD-10-CM | POA: Diagnosis not present

## 2017-05-01 DIAGNOSIS — M62551 Muscle wasting and atrophy, not elsewhere classified, right thigh: Secondary | ICD-10-CM | POA: Diagnosis not present

## 2017-05-01 DIAGNOSIS — M6281 Muscle weakness (generalized): Secondary | ICD-10-CM | POA: Diagnosis not present

## 2017-05-01 DIAGNOSIS — M545 Low back pain: Secondary | ICD-10-CM | POA: Diagnosis not present

## 2017-05-03 DIAGNOSIS — M6281 Muscle weakness (generalized): Secondary | ICD-10-CM | POA: Diagnosis not present

## 2017-05-03 DIAGNOSIS — M545 Low back pain: Secondary | ICD-10-CM | POA: Diagnosis not present

## 2017-05-03 DIAGNOSIS — M62551 Muscle wasting and atrophy, not elsewhere classified, right thigh: Secondary | ICD-10-CM | POA: Diagnosis not present

## 2017-05-17 ENCOUNTER — Ambulatory Visit: Payer: Self-pay | Admitting: Orthopedic Surgery

## 2017-06-13 ENCOUNTER — Ambulatory Visit: Payer: Self-pay | Admitting: Orthopedic Surgery

## 2017-06-13 NOTE — H&P (View-Only) (Signed)
Lance Hill, BEFORT (72yo, M) DOB [birth year]  Chief Complaint Right Hip Pain H&P right hip revision 06/26/2017  Patient's Care Team Primary Care Provider: Raye Sorrow DO: [facility], [place] [address] [facility] [place], [place] [address], Ph [phone], Fax [phone] NPI: [id number] Patient's Pharmacies [id number]): [address],  [place] [address], Ph [phone], Fax [phone]   Vitals Ht: 5 ft 8 in  Wt: 190 lbs  BMI: 28.9 Pain Scale: 4 Pain Scale Type: Numeric BP - 142/76 Pulse - 72  Allergies Reviewed Allergies NKDA   Medications Reviewed Medications acetaminophen 06/11/17   entered Lance Hill amLODIPine 06/11/17   entered Lance Hill aspirin 81mg  06/11/17   entered Lance Hill diclofenac 1 % topical gel 05/23/16   filled [facility] Fish Oil 06/11/17   entered Lance Hill gabapentin 06/11/17   entered Lance Hill glyBURIDE 5 mg tablet bid 06/11/17   entered Lance Hill hydroCHLOROthiazide 06/11/17   entered Lance Hill ketorolac 0.5 % eye drops 11/23/16   filled [facility] loratadine 06/11/17   entered Lance Hill losartan 06/11/17   entered Lance Hill magnesium 06/11/17   entered Kieth Brightly metaxalone 800 mg tablet 09/26/16   filled [facility] metFORMIN 06/11/17   entered Lance Hill multivitamin 06/11/17   entered Lance Hill pioglitazone 45 mg tablet Take 1 tablet(s) every day by oral route. 06/11/17   entered Kieth Brightly Quinoa-Kale-Hemp 06/11/17   entered Lance Hill raNITIdine 300 mg tablet 11/07/16   filled [facility] Simbrinza 06/11/17   entered Kieth Brightly simvastatin 20 mg tablet Take 1 tablet(s) every day by oral route. 06/11/17   entered Kieth Brightly traMADol 06/11/17   entered Kieth Brightly Vitamin D3 06/11/17   entered Kieth Brightly    Problems Reviewed Problems Mechanical complication of internal joint prosthesis -  Right History of right hip replacement -    Family  History Reviewed Family History Father - Father deceased   - Gastric hemorrhage Mother - Mother deceased   - Cerebrovascular accident   - Hypertensive disorder   - Carcinoma in situ of breast   Social History Reviewed Social History Smoking Status: Never smoker Alcohol intake: Occasional Exercise level: Occasional Advance directive: Y (Notes: Living Will) Medical Power of Attorney: Y Live alone or with others?: alone Marital status: Married   Surgical History Reviewed Surgical History Procedure on back - October 2018 Total replacement of right knee joint - July 2017 Total replacement of right hip joint - May 2016 Cataract surgery - Right Eye - 2015, Left Eye - 11/2016 Cholecystectomy - 2009 Tonsillectomy - 1953   Past Medical History Reviewed Past Medical History Diabetes: Y - Non-Insulin Dependent GERD/Reflux: Y Notes: Impaired Vision,  Impaired Hearing,  Cataracts,  Peripheral Neuropathy,  Urinary Urgency,  Childhood Measels,  Childhood Mumps    HPI Patient is a 72 year old male scheduled for a right hip bursectomy revision poly exchange versus acetabular revision by Dr. Lequita Halt on 2.27/2019 at [facility]. The patient is a 72 year old male who is being followed for their right hip pain and back pain. They are month(s) out from lumbar ESI. He is status post R THA 09/01/14 by Dr. Monna Fam and lumbar decompression 02/06/17 by Dr. Shelle Iron. Symptoms reported include: pain, aching and pain with weightbearing. The patient feels that they are doing poorly and report their pain level to be moderate to severe. The patient also states that their right knee is still hurting from a TKA in 2017 Unfortunately the lumbar surgery  did not help at all with his hip pain. He has had the bone scan of the RIGHT hip which showed no loosening. He had the MRI which did show a fluid collection around the greater trochanter. Attempts at aspiration have been unsuccessful. He is not having any fever,  chills or infectious type symptoms. Main problem is lateral hip pain and groin pain with weightbearing. Unfortunately the pain persists and nothing has helped it. He is also getting some anterior knee pain. He is not having any swelling or mechanical type symptoms.  He has a real dilemma going on with that RIGHT hip. Bone scan was normal suggesting no loosening but the MRI did show that fluid collection which is concerning for possible abnormal polyethylene wear. The prosthesis appears to be in good position but he might be impinging and wearing out the poly liner. At this point the only other option is to explore the hip and at least do the bursectomy and inspect the gluteal tendons. We will also inspect the polyethylene to see if it has excess wear. If so we probably will need to revise the whole acetabular component because impingement can cause early wear. If it is just a bursal problem than that would be the ideal situation but I am concerned about the possibility of abnormal wear. We discussed all this in detail and he would like to go ahead and proceed with the proposed surgical plan.   ROS Constitutional: Constitutional: no fever, chills, night sweats, or significant weight loss.  Cardiovascular: Cardiovascular: no palpitations or chest pain.  Respiratory: Respiratory: no shortness of breath, cough, and No COPD.  Gastrointestinal: Gastrointestinal: no vomiting or nausea.  Musculoskeletal: Musculoskeletal: no swelling in Joints, Joint Pain, and back pain.  Neurologic: Neurologic: no numbness or tingling and difficulty with balance.   Physical Exam Patient is a 72 year old male.  General Mental Status - Alert, cooperative and good historian. General Appearance - pleasant, Not in acute distress. Orientation - Oriented X3. Build & Nutrition - Well nourished and Well developed.  Head and Neck Bilateral hearing aids Head - normocephalic, atraumatic . Neck Global Assessment - supple,  no bruit auscultated on the right, no bruit auscultated on the left.  Eye Wears glasses Pupil - Bilateral - PERR Motion - Bilateral - EOMI.  Chest and Lung Exam Auscultation Breath sounds - clear at anterior chest wall and clear at posterior chest wall. Adventitious sounds - No Adventitious sounds.  Cardiovascular Auscultation Rhythm - Regular rate and rhythm. Heart Sounds - S1 WNL and S2 WNL. Murmurs & Other Heart Sounds - Auscultation of the heart reveals - No Murmurs.  Abdomen Palpation/Percussion Tenderness - Abdomen is non-tender to palpation. Abdomen is soft. Auscultation Auscultation of the abdomen reveals - Bowel sounds normal. Slightly round abdomen  Male Genitourinary Note: Not done, not pertinent to present illness  Musculoskeletal RIGHT hip can be flexed to 110, rotated and 20, out 30 and abduct to 30 without pain on range of motion. There is slight tenderness around the greater trochanter. He has an antalgic gait pattern on the RIGHT. His RIGHT knee shows no swelling. There is no tenderness about the RIGHT knee. Range of motion 0-120 and no instability.   Assessment / Plan 1. Mechanical complication of internal joint prosthesis - Right T84.090D: Other mechanical complication of internal right hip prosthesis, subsequent encounter  2. History of right hip replacement Z96.641: Presence of right artificial hip joint  Patient Instructions Surgical Plans: Right Total Hip Revison, Right Hip Bursectomy Disposition:  Home, HHPT PCP: Dr. Katheren Puller IV TXA Anesthesia Issues: None Patient was instructed on what medications to stop prior to surgery. - Follow up visit in 2 weeks with Dr. Lequita Halt - Begin physical therapy following surgery - Pre-operative lab work as pre Pre-Surgical Testing - Prescriptions will be provided in hospital at time of discharge  Return to Office Patrica Duel, PA-C for Post-Op at [facility] on 07/10/2017 at 09:45 AM  Encounter  signed-off by Patrica Duel, PA-C

## 2017-06-13 NOTE — H&P (Signed)
Lance Hill, BEFORT (72yo, M) DOB [birth year]  Chief Complaint Right Hip Pain H&P right hip revision 06/26/2017  Patient's Care Team Primary Care Provider: Raye Sorrow DO: [facility], [place] [address] [facility] [place], [place] [address], Ph [phone], Fax [phone] NPI: [id number] Patient's Pharmacies [id number]): [address], [place] [address], Ph [phone], Fax [phone]   Vitals Ht: 5 ft 8 in  Wt: 190 lbs  BMI: 28.9 Pain Scale: 4 Pain Scale Type: Numeric BP - 142/76 Pulse - 72  Allergies Reviewed Allergies NKDA   Medications Reviewed Medications acetaminophen 06/11/17   entered Lance Hill amLODIPine 06/11/17   entered Lance Hill aspirin 81mg  06/11/17   entered Lance Hill diclofenac 1 % topical gel 05/23/16   filled [facility] Fish Oil 06/11/17   entered Lance Hill gabapentin 06/11/17   entered Lance Hill glyBURIDE 5 mg tablet bid 06/11/17   entered Lance Hill hydroCHLOROthiazide 06/11/17   entered Lance Hill ketorolac 0.5 % eye drops 11/23/16   filled [facility] loratadine 06/11/17   entered Lance Hill losartan 06/11/17   entered Lance Hill magnesium 06/11/17   entered Kieth Brightly metaxalone 800 mg tablet 09/26/16   filled [facility] metFORMIN 06/11/17   entered Lance Hill multivitamin 06/11/17   entered Lance Hill pioglitazone 45 mg tablet Take 1 tablet(s) every day by oral route. 06/11/17   entered Kieth Brightly Quinoa-Kale-Hemp 06/11/17   entered Lance Hill raNITIdine 300 mg tablet 11/07/16   filled [facility] Simbrinza 06/11/17   entered Kieth Brightly simvastatin 20 mg tablet Take 1 tablet(s) every day by oral route. 06/11/17   entered Kieth Brightly traMADol 06/11/17   entered Kieth Brightly Vitamin D3 06/11/17   entered Kieth Brightly    Problems Reviewed Problems Mechanical complication of internal joint prosthesis -  Right History of right hip replacement -    Family  History Reviewed Family History Father - Father deceased   - Gastric hemorrhage Mother - Mother deceased   - Cerebrovascular accident   - Hypertensive disorder   - Carcinoma in situ of breast   Social History Reviewed Social History Smoking Status: Never smoker Alcohol intake: Occasional Exercise level: Occasional Advance directive: Y (Notes: Living Will) Medical Power of Attorney: Y Live alone or with others?: alone Marital status: Married   Surgical History Reviewed Surgical History Procedure on back - October 2018 Total replacement of right knee joint - July 2017 Total replacement of right hip joint - May 2016 Cataract surgery - Right Eye - 2015, Left Eye - 11/2016 Cholecystectomy - 2009 Tonsillectomy - 1953   Past Medical History Reviewed Past Medical History Diabetes: Y - Non-Insulin Dependent GERD/Reflux: Y Notes: Impaired Vision,  Impaired Hearing,  Cataracts,  Peripheral Neuropathy,  Urinary Urgency,  Childhood Measels,  Childhood Mumps    HPI Patient is a 72 year old male scheduled for a right hip bursectomy revision poly exchange versus acetabular revision by Dr. Lequita Halt on 2.27/2019 at [facility]. The patient is a 72 year old male who is being followed for their right hip pain and back pain. They are month(s) out from lumbar ESI. He is status post R THA 09/01/14 by Dr. Monna Fam and lumbar decompression 02/06/17 by Dr. Shelle Iron. Symptoms reported include: pain, aching and pain with weightbearing. The patient feels that they are doing poorly and report their pain level to be moderate to severe. The patient also states that their right knee is still hurting from a TKA in 2017 Unfortunately the lumbar surgery  did not help at all with his hip pain. He has had the bone scan of the RIGHT hip which showed no loosening. He had the MRI which did show a fluid collection around the greater trochanter. Attempts at aspiration have been unsuccessful. He is not having any fever,  chills or infectious type symptoms. Main problem is lateral hip pain and groin pain with weightbearing. Unfortunately the pain persists and nothing has helped it. He is also getting some anterior knee pain. He is not having any swelling or mechanical type symptoms.  He has a real dilemma going on with that RIGHT hip. Bone scan was normal suggesting no loosening but the MRI did show that fluid collection which is concerning for possible abnormal polyethylene wear. The prosthesis appears to be in good position but he might be impinging and wearing out the poly liner. At this point the only other option is to explore the hip and at least do the bursectomy and inspect the gluteal tendons. We will also inspect the polyethylene to see if it has excess wear. If so we probably will need to revise the whole acetabular component because impingement can cause early wear. If it is just a bursal problem than that would be the ideal situation but I am concerned about the possibility of abnormal wear. We discussed all this in detail and he would like to go ahead and proceed with the proposed surgical plan.   ROS Constitutional: Constitutional: no fever, chills, night sweats, or significant weight loss.  Cardiovascular: Cardiovascular: no palpitations or chest pain.  Respiratory: Respiratory: no shortness of breath, cough, and No COPD.  Gastrointestinal: Gastrointestinal: no vomiting or nausea.  Musculoskeletal: Musculoskeletal: no swelling in Joints, Joint Pain, and back pain.  Neurologic: Neurologic: no numbness or tingling and difficulty with balance.   Physical Exam Patient is a 72 year old male.  General Mental Status - Alert, cooperative and good historian. General Appearance - pleasant, Not in acute distress. Orientation - Oriented X3. Build & Nutrition - Well nourished and Well developed.  Head and Neck Bilateral hearing aids Head - normocephalic, atraumatic . Neck Global Assessment - supple,  no bruit auscultated on the right, no bruit auscultated on the left.  Eye Wears glasses Pupil - Bilateral - PERR Motion - Bilateral - EOMI.  Chest and Lung Exam Auscultation Breath sounds - clear at anterior chest wall and clear at posterior chest wall. Adventitious sounds - No Adventitious sounds.  Cardiovascular Auscultation Rhythm - Regular rate and rhythm. Heart Sounds - S1 WNL and S2 WNL. Murmurs & Other Heart Sounds - Auscultation of the heart reveals - No Murmurs.  Abdomen Palpation/Percussion Tenderness - Abdomen is non-tender to palpation. Abdomen is soft. Auscultation Auscultation of the abdomen reveals - Bowel sounds normal. Slightly round abdomen  Male Genitourinary Note: Not done, not pertinent to present illness  Musculoskeletal RIGHT hip can be flexed to 110, rotated and 20, out 30 and abduct to 30 without pain on range of motion. There is slight tenderness around the greater trochanter. He has an antalgic gait pattern on the RIGHT. His RIGHT knee shows no swelling. There is no tenderness about the RIGHT knee. Range of motion 0-120 and no instability.   Assessment / Plan 1. Mechanical complication of internal joint prosthesis - Right T84.090D: Other mechanical complication of internal right hip prosthesis, subsequent encounter  2. History of right hip replacement Z96.641: Presence of right artificial hip joint  Patient Instructions Surgical Plans: Right Total Hip Revison, Right Hip Bursectomy Disposition:  Home, HHPT PCP: Dr. Katheren Puller IV TXA Anesthesia Issues: None Patient was instructed on what medications to stop prior to surgery. - Follow up visit in 2 weeks with Dr. Lequita Halt - Begin physical therapy following surgery - Pre-operative lab work as pre Pre-Surgical Testing - Prescriptions will be provided in hospital at time of discharge  Return to Office Patrica Duel, PA-C for Post-Op at [facility] on 07/10/2017 at 09:45 AM  Encounter  signed-off by Patrica Duel, PA-C

## 2017-06-19 NOTE — Patient Instructions (Addendum)
Lance Hill  06/19/2017   Your procedure is scheduled on: Wednesday 06/26/2017  Report to [facility] Main  Entrance              Report to admitting at 1145  AM   Call this number if you have problems the morning of surgery [phone]   How to Manage Your Diabetes Before and After Surgery  Why is it important to control my blood sugar before and after surgery? . Improving blood sugar levels before and after surgery helps healing and can limit problems. . A way of improving blood sugar control is eating a healthy diet by: o  Eating less sugar and carbohydrates o  Increasing activity/exercise o  Talking with your doctor about reaching your blood sugar goals . High blood sugars (greater than 180 mg/dL) can raise your risk of infections and slow your recovery, so you will need to focus on controlling your diabetes during the weeks before surgery. . Make sure that the doctor who takes care of your diabetes knows about your planned surgery including the date and location.  How do I manage my blood sugar before surgery? . Check your blood sugar at least 4 times a day, starting 2 days before surgery, to make sure that the level is not too high or low. o Check your blood sugar the morning of your surgery when you wake up and every 2 hours until you get to the Short Stay unit. . If your blood sugar is less than 70 mg/dL, you will need to treat for low blood sugar: o Do not take insulin. o Treat a low blood sugar (less than 70 mg/dL) with  cup of clear juice (cranberry or apple), 4 glucose tablets, OR glucose gel. o Recheck blood sugar in 15 minutes after treatment (to make sure it is greater than 70 mg/dL). If your blood sugar is not greater than 70 mg/dL on recheck, call [phone] for further instructions. . Report your blood sugar to the short stay nurse when you get to Short Stay.  . If you are admitted to the hospital after surgery: o Your blood sugar will  be checked by the staff and you will probably be given insulin after surgery (instead of oral diabetes medicines) to make sure you have good blood sugar levels. o The goal for blood sugar control after surgery is 80-180 mg/dL.   WHAT DO I DO ABOUT MY DIABETES MEDICATION?           The day before your surgery , take only the morning dose of Glyburide, Take your usual dose of Metformin and Actos!   . Do not take oral diabetes medicines (pills) the morning of surgery.     Remember: Do not eat food:After Midnight. May Have clear liquids from midnight up until 0815 and then nothing until after surgery!      CLEAR LIQUID DIET   Foods Allowed                                                                     Foods Excluded  Coffee and tea, regular and decaf  liquids that you cannot  Plain Jell-O in any flavor                                             see through such as: Fruit ices (not with fruit pulp)                                     milk, soups, orange juice  Iced Popsicles                                    All solid food Carbonated beverages, regular and diet                                    Cranberry, grape and apple juices Sports drinks like Gatorade Lightly seasoned clear broth or consume(fat free) Sugar, honey syrup  Sample Menu Breakfast                                Lunch                                     Supper Cranberry juice                    Beef broth                            Chicken broth Jell-O                                     Grape juice                           Apple juice Coffee or tea                        Jell-O                                      Popsicle                                                Coffee or tea                        Coffee or tea  _____________________________________________________________________     Take these medicines the morning of surgery with A SIP OF WATER: Amlodipine (Norvasc),  Gabapentin (Neurontin), Loratadine (Claritin), Ranitidine (Zantac), use eye drops               DO NOT TAKE ANY DIABETIC MEDICATIONS DAY OF YOUR SURGERY  You may not have any metal on your body including hair pins and              piercings  Do not wear jewelry, make-up, lotions, powders or perfumes, deodorant             Do not wear nail polish.  Do not shave  48 hours prior to surgery.              Men may shave face and neck.   Do not bring valuables to the hospital. [facility] IS NOT             RESPONSIBLE   FOR VALUABLES.  Contacts, dentures or bridgework may not be worn into surgery.  Leave suitcase in the car. After surgery it may be brought to your room.                  Please read over the following fact sheets you were given: _____________________________________________________________________             [facility] - Preparing for Surgery Before surgery, you can play an important role.  Because skin is not sterile, your skin needs to be as free of germs as possible.  You can reduce the number of germs on your skin by washing with CHG (chlorahexidine gluconate) soap before surgery.  CHG is an antiseptic cleaner which kills germs and bonds with the skin to continue killing germs even after washing. Please DO NOT use if you have an allergy to CHG or antibacterial soaps.  If your skin becomes reddened/irritated stop using the CHG and inform your nurse when you arrive at Short Stay. Do not shave (including legs and underarms) for at least 48 hours prior to the first CHG shower.  You may shave your face/neck. Please follow these instructions carefully:  1.  Shower with CHG Soap the night before surgery and the  morning of Surgery.  2.  If you choose to wash your hair, wash your hair first as usual with your  normal  shampoo.  3.  After you shampoo, rinse your hair and body thoroughly to remove the  shampoo.                           4.  Use CHG as  you would any other liquid soap.  You can apply chg directly  to the skin and wash                       Gently with a scrungie or clean washcloth.  5.  Apply the CHG Soap to your body ONLY FROM THE NECK DOWN.   Do not use on face/ open                           Wound or open sores. Avoid contact with eyes, ears mouth and genitals (private parts).                       Wash face,  Genitals (private parts) with your normal soap.             6.  Wash thoroughly, paying special attention to the area where your surgery  will be performed.  7.  Thoroughly rinse your body with warm water from the neck down.  8.  DO NOT shower/wash with your normal soap after using  and rinsing off  the CHG Soap.                9.  Pat yourself dry with a clean towel.            10.  Wear clean pajamas.            11.  Place clean sheets on your bed the night of your first shower and do not  sleep with pets. Day of Surgery : Do not apply any lotions/deodorants the morning of surgery.  Please wear clean clothes to the hospital/surgery center.  FAILURE TO FOLLOW THESE INSTRUCTIONS MAY RESULT IN THE CANCELLATION OF YOUR SURGERY PATIENT SIGNATURE_________________________________  NURSE SIGNATURE__________________________________  ________________________________________________________________________   Rogelia Mire  An incentive spirometer is a tool that can help keep your lungs clear and active. This tool measures how well you are filling your lungs with each breath. Taking long deep breaths may help reverse or decrease the chance of developing breathing (pulmonary) problems (especially infection) following:  A long period of time when you are unable to move or be active. BEFORE THE PROCEDURE   If the spirometer includes an indicator to show your best effort, your nurse or respiratory therapist will set it to a desired goal.  If possible, sit up straight or lean slightly forward. Try not to  slouch.  Hold the incentive spirometer in an upright position. INSTRUCTIONS FOR USE  1. Sit on the edge of your bed if possible, or sit up as far as you can in bed or on a chair. 2. Hold the incentive spirometer in an upright position. 3. Breathe out normally. 4. Place the mouthpiece in your mouth and seal your lips tightly around it. 5. Breathe in slowly and as deeply as possible, raising the piston or the ball toward the top of the column. 6. Hold your breath for 3-5 seconds or for as long as possible. Allow the piston or ball to fall to the bottom of the column. 7. Remove the mouthpiece from your mouth and breathe out normally. 8. Rest for a few seconds and repeat Steps 1 through 7 at least 10 times every 1-2 hours when you are awake. Take your time and take a few normal breaths between deep breaths. 9. The spirometer may include an indicator to show your best effort. Use the indicator as a goal to work toward during each repetition. 10. After each set of 10 deep breaths, practice coughing to be sure your lungs are clear. If you have an incision (the cut made at the time of surgery), support your incision when coughing by placing a pillow or rolled up towels firmly against it. Once you are able to get out of bed, walk around indoors and cough well. You may stop using the incentive spirometer when instructed by your caregiver.  RISKS AND COMPLICATIONS  Take your time so you do not get dizzy or light-headed.  If you are in pain, you may need to take or ask for pain medication before doing incentive spirometry. It is harder to take a deep breath if you are having pain. AFTER USE  Rest and breathe slowly and easily.  It can be helpful to keep track of a log of your progress. Your caregiver can provide you with a simple table to help with this. If you are using the spirometer at home, follow these instructions: SEEK MEDICAL CARE IF:   You are having difficultly using the spirometer.  You  have trouble  using the spirometer as often as instructed.  Your pain medication is not giving enough relief while using the spirometer.  You develop fever of 100.5 F (38.1 C) or higher. SEEK IMMEDIATE MEDICAL CARE IF:   You cough up bloody sputum that had not been present before.  You develop fever of 102 F (38.9 C) or greater.  You develop worsening pain at or near the incision site. MAKE SURE YOU:   Understand these instructions.  Will watch your condition.  Will get help right away if you are not doing well or get worse. Document Released: 08/27/2006 Document Revised: 07/09/2011 Document Reviewed: 10/28/2006 Exit[employer] Patient Information 2014 [employer], [place].   ________________________________________________________________________  WHAT IS A BLOOD TRANSFUSION? Blood Transfusion Information  A transfusion is the replacement of blood or some of its parts. Blood is made up of multiple cells which provide different functions.  Red blood cells carry oxygen and are used for blood loss replacement.  White blood cells fight against infection.  Platelets control bleeding.  Plasma helps clot blood.  Other blood products are available for specialized needs, such as hemophilia or other clotting disorders. BEFORE THE TRANSFUSION  Who gives blood for transfusions?   Healthy volunteers who are fully evaluated to make sure their blood is safe. This is blood bank blood. Transfusion therapy is the safest it has ever been in the practice of medicine. Before blood is taken from a donor, a complete history is taken to make sure that person has no history of diseases nor engages in risky social behavior (examples are intravenous drug use or sexual activity with multiple partners). The donor's travel history is screened to minimize risk of transmitting infections, such as malaria. The donated blood is tested for signs of infectious diseases, such as HIV and hepatitis. The blood is then  tested to be sure it is compatible with you in order to minimize the chance of a transfusion reaction. If you or a relative donates blood, this is often done in anticipation of surgery and is not appropriate for emergency situations. It takes many days to process the donated blood. RISKS AND COMPLICATIONS Although transfusion therapy is very safe and saves many lives, the main dangers of transfusion include:   Getting an infectious disease.  Developing a transfusion reaction. This is an allergic reaction to something in the blood you were given. Every precaution is taken to prevent this. The decision to have a blood transfusion has been considered carefully by your caregiver before blood is given. Blood is not given unless the benefits outweigh the risks. AFTER THE TRANSFUSION  Right after receiving a blood transfusion, you will usually feel much better and more energetic. This is especially true if your red blood cells have gotten low (anemic). The transfusion raises the level of the red blood cells which carry oxygen, and this usually causes an energy increase.  The nurse administering the transfusion will monitor you carefully for complications. HOME CARE INSTRUCTIONS  No special instructions are needed after a transfusion. You may find your energy is better. Speak with your caregiver about any limitations on activity for underlying diseases you may have. SEEK MEDICAL CARE IF:   Your condition is not improving after your transfusion.  You develop redness or irritation at the intravenous (IV) site. SEEK IMMEDIATE MEDICAL CARE IF:  Any of the following symptoms occur over the next 12 hours:  Shaking chills.  You have a temperature by mouth above 102 F (38.9 C), not controlled by medicine.  Chest, back, or muscle pain.  People around you feel you are not acting correctly or are confused.  Shortness of breath or difficulty breathing.  Dizziness and fainting.  You get a rash or  develop hives.  You have a decrease in urine output.  Your urine turns a dark color or changes to pink, red, or brown. Any of the following symptoms occur over the next 10 days:  You have a temperature by mouth above 102 F (38.9 C), not controlled by medicine.  Shortness of breath.  Weakness after normal activity.  The white part of the eye turns yellow (jaundice).  You have a decrease in the amount of urine or are urinating less often.  Your urine turns a dark color or changes to pink, red, or brown. Document Released: 04/13/2000 Document Revised: 07/09/2011 Document Reviewed: 12/01/2007 [facility] Patient Information 2014 [place], [place].  _______________________________________________________________________

## 2017-06-20 ENCOUNTER — Encounter (HOSPITAL_COMMUNITY): Payer: Self-pay

## 2017-06-20 ENCOUNTER — Encounter (HOSPITAL_COMMUNITY)
Admission: RE | Admit: 2017-06-20 | Discharge: 2017-06-20 | Disposition: A | Discharge status: Home / Self Care | Payer: Medicare Other | Source: Ambulatory Visit | Attending: Orthopedic Surgery | Admitting: Orthopedic Surgery

## 2017-06-20 ENCOUNTER — Other Ambulatory Visit: Payer: Self-pay

## 2017-06-20 DIAGNOSIS — M25551 Pain in right hip: Secondary | ICD-10-CM | POA: Diagnosis not present

## 2017-06-20 DIAGNOSIS — X58XXXD Exposure to other specified factors, subsequent encounter: Secondary | ICD-10-CM | POA: Diagnosis not present

## 2017-06-20 DIAGNOSIS — Z01812 Encounter for preprocedural laboratory examination: Secondary | ICD-10-CM | POA: Insufficient documentation

## 2017-06-20 DIAGNOSIS — Z96641 Presence of right artificial hip joint: Secondary | ICD-10-CM | POA: Insufficient documentation

## 2017-06-20 DIAGNOSIS — T84090D Other mechanical complication of internal right hip prosthesis, subsequent encounter: Secondary | ICD-10-CM | POA: Insufficient documentation

## 2017-06-20 HISTORY — DX: Essential (primary) hypertension: I10

## 2017-06-20 LAB — COMPREHENSIVE METABOLIC PANEL
ALT: 21 U/L (ref 17–63)
AST: 23 U/L (ref 15–41)
Albumin: 3.9 g/dL (ref 3.5–5.0)
Alkaline Phosphatase: 56 U/L (ref 38–126)
Anion gap: 11 (ref 5–15)
BUN: 29 mg/dL — AB (ref 6–20)
CHLORIDE: 103 mmol/L (ref 101–111)
CO2: 25 mmol/L (ref 22–32)
CREATININE: 1.31 mg/dL — AB (ref 0.61–1.24)
Calcium: 9.4 mg/dL (ref 8.9–10.3)
GFR calc Af Amer: >60 mL/min (ref >60)
GFR, EST NON AFRICAN AMERICAN: 52 mL/min — AB (ref >60)
GLUCOSE: 134 mg/dL — AB (ref 65–99)
Potassium: 4.2 mmol/L (ref 3.5–5.1)
Sodium: 139 mmol/L (ref 135–145)
Total Bilirubin: 0.8 mg/dL (ref 0.3–1.2)
Total Protein: 7.4 g/dL (ref 6.5–8.1)

## 2017-06-20 LAB — PROTIME-INR
INR: 1.09
PROTHROMBIN TIME: 14 s (ref 11.4–15.2)

## 2017-06-20 LAB — SURGICAL PCR SCREEN
MRSA, PCR: NEGATIVE
Staphylococcus aureus: POSITIVE — AB

## 2017-06-20 LAB — CBC
HCT: 38.5 % — ABNORMAL LOW (ref 39.0–52.0)
Hemoglobin: 12.8 g/dL — ABNORMAL LOW (ref 13.0–17.0)
MCH: 30.8 pg (ref 26.0–34.0)
MCHC: 33.2 g/dL (ref 30.0–36.0)
MCV: 92.5 fL (ref 78.0–100.0)
PLATELETS: 190 10*3/uL (ref 150–400)
RBC: 4.16 MIL/uL — AB (ref 4.22–5.81)
RDW: 12.9 % (ref 11.5–15.5)
WBC: 7.2 10*3/uL (ref 4.0–10.5)

## 2017-06-20 LAB — HEMOGLOBIN A1C
HEMOGLOBIN A1C: 6.9 % — AB (ref 4.8–5.6)
Mean Plasma Glucose: 151.33 mg/dL

## 2017-06-20 LAB — GLUCOSE, CAPILLARY: Glucose-Capillary: 139 mg/dL — ABNORMAL HIGH (ref 65–99)

## 2017-06-20 LAB — ABO/RH: ABO/RH(D): O POS

## 2017-06-20 LAB — APTT: aPTT: 34 seconds (ref 24–36)

## 2017-06-20 NOTE — Progress Notes (Signed)
11/12/2016- Clearance on chart from Dr. Gerlene FeePiva   11/21/2016-[facility]  01/29/2017- [facility] -HgA1C,   11/02/2016- [facility]- [facility], [facility]  05/08/2016- [facility] -CMP

## 2017-06-25 MED ORDER — TRANEXAMIC ACID 1000 MG/10ML IV SOLN
1000.0000 mg | INTRAVENOUS | Status: AC
  Administered 2017-06-26: 1000 mg via INTRAVENOUS
  Filled 2017-06-25: qty 1100

## 2017-06-26 ENCOUNTER — Encounter (HOSPITAL_COMMUNITY): Payer: Self-pay | Admitting: Anesthesiology

## 2017-06-26 ENCOUNTER — Inpatient Hospital Stay (HOSPITAL_COMMUNITY)
Admission: RE | Admit: 2017-06-26 | Discharge: 2017-06-27 | DRG: 468 | Disposition: A | Discharge status: Home / Self Care | Payer: Medicare Other | Source: Ambulatory Visit | Attending: Orthopedic Surgery | Admitting: Orthopedic Surgery

## 2017-06-26 ENCOUNTER — Other Ambulatory Visit: Payer: Self-pay

## 2017-06-26 ENCOUNTER — Inpatient Hospital Stay (HOSPITAL_COMMUNITY): Payer: Medicare Other | Admitting: Anesthesiology

## 2017-06-26 ENCOUNTER — Encounter (HOSPITAL_COMMUNITY)
Admission: RE | Disposition: A | Discharge status: Home / Self Care | Payer: Self-pay | Source: Ambulatory Visit | Attending: Orthopedic Surgery

## 2017-06-26 DIAGNOSIS — Z96649 Presence of unspecified artificial hip joint: Secondary | ICD-10-CM

## 2017-06-26 DIAGNOSIS — Z6829 Body mass index (BMI) 29.0-29.9, adult: Secondary | ICD-10-CM | POA: Diagnosis not present

## 2017-06-26 DIAGNOSIS — T84090A Other mechanical complication of internal right hip prosthesis, initial encounter: Secondary | ICD-10-CM | POA: Diagnosis present

## 2017-06-26 DIAGNOSIS — I10 Essential (primary) hypertension: Secondary | ICD-10-CM | POA: Diagnosis present

## 2017-06-26 DIAGNOSIS — T84030A Mechanical loosening of internal right hip prosthetic joint, initial encounter: Secondary | ICD-10-CM | POA: Diagnosis not present

## 2017-06-26 DIAGNOSIS — Z7982 Long term (current) use of aspirin: Secondary | ICD-10-CM

## 2017-06-26 DIAGNOSIS — M48061 Spinal stenosis, lumbar region without neurogenic claudication: Secondary | ICD-10-CM | POA: Diagnosis not present

## 2017-06-26 DIAGNOSIS — K219 Gastro-esophageal reflux disease without esophagitis: Secondary | ICD-10-CM | POA: Diagnosis present

## 2017-06-26 DIAGNOSIS — T84018D Broken internal joint prosthesis, other site, subsequent encounter: Secondary | ICD-10-CM

## 2017-06-26 DIAGNOSIS — E1151 Type 2 diabetes mellitus with diabetic peripheral angiopathy without gangrene: Secondary | ICD-10-CM | POA: Diagnosis present

## 2017-06-26 DIAGNOSIS — T8489XA Other specified complication of internal orthopedic prosthetic devices, implants and grafts, initial encounter: Secondary | ICD-10-CM | POA: Diagnosis not present

## 2017-06-26 DIAGNOSIS — Z7984 Long term (current) use of oral hypoglycemic drugs: Secondary | ICD-10-CM

## 2017-06-26 DIAGNOSIS — H9190 Unspecified hearing loss, unspecified ear: Secondary | ICD-10-CM | POA: Diagnosis present

## 2017-06-26 DIAGNOSIS — E119 Type 2 diabetes mellitus without complications: Secondary | ICD-10-CM | POA: Diagnosis not present

## 2017-06-26 DIAGNOSIS — Z96641 Presence of right artificial hip joint: Secondary | ICD-10-CM | POA: Diagnosis not present

## 2017-06-26 DIAGNOSIS — T8484XA Pain due to internal orthopedic prosthetic devices, implants and grafts, initial encounter: Secondary | ICD-10-CM | POA: Diagnosis present

## 2017-06-26 DIAGNOSIS — Z79899 Other long term (current) drug therapy: Secondary | ICD-10-CM

## 2017-06-26 DIAGNOSIS — Z96651 Presence of right artificial knee joint: Secondary | ICD-10-CM | POA: Diagnosis present

## 2017-06-26 DIAGNOSIS — T84018S Broken internal joint prosthesis, other site, sequela: Secondary | ICD-10-CM

## 2017-06-26 HISTORY — DX: Presence of unspecified artificial hip joint: T84.018S

## 2017-06-26 HISTORY — DX: Broken internal joint prosthesis, other site, subsequent encounter: T84.018D

## 2017-06-26 HISTORY — PX: TOTAL HIP REVISION: SHX763

## 2017-06-26 HISTORY — DX: Presence of unspecified artificial hip joint: Z96.649

## 2017-06-26 LAB — GLUCOSE, CAPILLARY
GLUCOSE-CAPILLARY: 144 mg/dL — AB (ref 65–99)
GLUCOSE-CAPILLARY: 250 mg/dL — AB (ref 65–99)
Glucose-Capillary: 169 mg/dL — ABNORMAL HIGH (ref 65–99)
Glucose-Capillary: 442 mg/dL — ABNORMAL HIGH (ref 65–99)
Glucose-Capillary: 461 mg/dL — ABNORMAL HIGH (ref 65–99)

## 2017-06-26 LAB — TYPE AND SCREEN
ABO/RH(D): O POS
Antibody Screen: NEGATIVE

## 2017-06-26 SURGERY — TOTAL HIP REVISION
Anesthesia: General | Site: Hip | Laterality: Right

## 2017-06-26 MED ORDER — MIDAZOLAM HCL 5 MG/5ML IJ SOLN
INTRAMUSCULAR | Status: DC | PRN
  Administered 2017-06-26: 2 mg via INTRAVENOUS

## 2017-06-26 MED ORDER — DEXAMETHASONE SODIUM PHOSPHATE 10 MG/ML IJ SOLN
10.0000 mg | Freq: Once | INTRAMUSCULAR | Status: AC
  Administered 2017-06-27: 10 mg via INTRAVENOUS
  Filled 2017-06-26: qty 1

## 2017-06-26 MED ORDER — BUPIVACAINE-EPINEPHRINE (PF) 0.25% -1:200000 IJ SOLN
INTRAMUSCULAR | Status: DC | PRN
  Administered 2017-06-26: 30 mL

## 2017-06-26 MED ORDER — PHENOL 1.4 % MT LIQD
1.0000 | OROMUCOSAL | Status: DC | PRN
Start: 2017-06-26 — End: 2017-06-27
  Filled 2017-06-26: qty 177

## 2017-06-26 MED ORDER — PHENYLEPHRINE 40 MCG/ML (10ML) SYRINGE FOR IV PUSH (FOR BLOOD PRESSURE SUPPORT)
PREFILLED_SYRINGE | INTRAVENOUS | Status: DC | PRN
  Administered 2017-06-26 (×4): 80 ug via INTRAVENOUS

## 2017-06-26 MED ORDER — KETOROLAC TROMETHAMINE 30 MG/ML IJ SOLN: 30.0000 mg | Freq: Once | INTRAMUSCULAR | Status: DC | PRN

## 2017-06-26 MED ORDER — ONDANSETRON HCL 4 MG/2ML IJ SOLN
INTRAMUSCULAR | Status: DC | PRN
  Administered 2017-06-26: 4 mg via INTRAVENOUS

## 2017-06-26 MED ORDER — GABAPENTIN 300 MG PO CAPS
300.0000 mg | ORAL_CAPSULE | Freq: Two times a day (BID) | ORAL | Status: DC
  Administered 2017-06-26 – 2017-06-27 (×2): 300 mg via ORAL
  Filled 2017-06-26 (×2): qty 1

## 2017-06-26 MED ORDER — MIDAZOLAM HCL 2 MG/2ML IJ SOLN
INTRAMUSCULAR | Status: AC
  Filled 2017-06-26: qty 2

## 2017-06-26 MED ORDER — HYDROCODONE-ACETAMINOPHEN 5-325 MG PO TABS
1.0000 | ORAL_TABLET | ORAL | Status: DC | PRN
  Administered 2017-06-26: 2 via ORAL
  Administered 2017-06-27 (×3): 1 via ORAL
  Filled 2017-06-26 (×2): qty 1
  Filled 2017-06-26: qty 2
  Filled 2017-06-26: qty 1

## 2017-06-26 MED ORDER — ROCURONIUM BROMIDE 10 MG/ML (PF) SYRINGE
PREFILLED_SYRINGE | INTRAVENOUS | Status: DC | PRN
  Administered 2017-06-26: 50 mg via INTRAVENOUS

## 2017-06-26 MED ORDER — PROMETHAZINE HCL 25 MG/ML IJ SOLN
6.2500 mg | INTRAMUSCULAR | Status: DC | PRN
Start: 2017-06-26 — End: 2017-06-26

## 2017-06-26 MED ORDER — METOCLOPRAMIDE HCL 5 MG PO TABS: 5.0000 mg | ORAL_TABLET | Freq: Three times a day (TID) | ORAL | Status: DC | PRN

## 2017-06-26 MED ORDER — ASPIRIN EC 325 MG PO TBEC
325.0000 mg | DELAYED_RELEASE_TABLET | Freq: Every day | ORAL | Status: DC
  Administered 2017-06-27: 325 mg via ORAL
  Filled 2017-06-26: qty 1

## 2017-06-26 MED ORDER — CEFAZOLIN SODIUM-DEXTROSE 2-4 GM/100ML-% IV SOLN
2.0000 g | Freq: Four times a day (QID) | INTRAVENOUS | Status: AC
  Administered 2017-06-26 – 2017-06-27 (×2): 2 g via INTRAVENOUS
  Filled 2017-06-26 (×2): qty 100

## 2017-06-26 MED ORDER — LACTATED RINGERS IV SOLN
INTRAVENOUS | Status: DC
  Administered 2017-06-26 (×2): via INTRAVENOUS

## 2017-06-26 MED ORDER — LORATADINE 10 MG PO TABS
10.0000 mg | ORAL_TABLET | Freq: Every day | ORAL | Status: DC
  Administered 2017-06-27: 10 mg via ORAL
  Filled 2017-06-26: qty 1

## 2017-06-26 MED ORDER — SUGAMMADEX SODIUM 200 MG/2ML IV SOLN
INTRAVENOUS | Status: DC | PRN
  Administered 2017-06-26: 200 mg via INTRAVENOUS

## 2017-06-26 MED ORDER — ONDANSETRON HCL 4 MG PO TABS: 4.0000 mg | ORAL_TABLET | Freq: Four times a day (QID) | ORAL | Status: DC | PRN

## 2017-06-26 MED ORDER — INSULIN ASPART 100 UNIT/ML ~~LOC~~ SOLN
5.0000 [IU] | Freq: Once | SUBCUTANEOUS | Status: AC
  Administered 2017-06-26: 5 [IU] via SUBCUTANEOUS

## 2017-06-26 MED ORDER — CEFAZOLIN SODIUM-DEXTROSE 2-4 GM/100ML-% IV SOLN
INTRAVENOUS | Status: AC
  Filled 2017-06-26: qty 100

## 2017-06-26 MED ORDER — METHOCARBAMOL 1000 MG/10ML IJ SOLN
500.0000 mg | Freq: Four times a day (QID) | INTRAMUSCULAR | Status: DC | PRN
  Administered 2017-06-26: 500 mg via INTRAVENOUS
  Filled 2017-06-26: qty 550

## 2017-06-26 MED ORDER — CEFAZOLIN SODIUM-DEXTROSE 2-4 GM/100ML-% IV SOLN
2.0000 g | INTRAVENOUS | Status: AC
  Administered 2017-06-26: 2 g via INTRAVENOUS

## 2017-06-26 MED ORDER — SUGAMMADEX SODIUM 200 MG/2ML IV SOLN
INTRAVENOUS | Status: AC
  Filled 2017-06-26: qty 2

## 2017-06-26 MED ORDER — MENTHOL 3 MG MT LOZG
1.0000 | LOZENGE | OROMUCOSAL | Status: DC | PRN
  Administered 2017-06-27: 3 mg via ORAL
  Filled 2017-06-26: qty 9

## 2017-06-26 MED ORDER — HYDROMORPHONE HCL 1 MG/ML IJ SOLN
INTRAMUSCULAR | Status: AC
  Administered 2017-06-26: 0.5 mg via INTRAVENOUS
  Filled 2017-06-26: qty 2

## 2017-06-26 MED ORDER — POLYETHYLENE GLYCOL 3350 17 G PO PACK: 17.0000 g | PACK | Freq: Every day | ORAL | Status: DC | PRN

## 2017-06-26 MED ORDER — SODIUM CHLORIDE 0.9 % IR SOLN
Status: DC | PRN
  Administered 2017-06-26: 3000 mL

## 2017-06-26 MED ORDER — DEXAMETHASONE SODIUM PHOSPHATE 10 MG/ML IJ SOLN
10.0000 mg | Freq: Once | INTRAMUSCULAR | Status: AC
  Administered 2017-06-26: 10 mg via INTRAVENOUS

## 2017-06-26 MED ORDER — MEPERIDINE HCL 50 MG/ML IJ SOLN: 6.2500 mg | INTRAMUSCULAR | Status: DC | PRN

## 2017-06-26 MED ORDER — INSULIN ASPART 100 UNIT/ML ~~LOC~~ SOLN
0.0000 [IU] | Freq: Three times a day (TID) | SUBCUTANEOUS | Status: DC
  Administered 2017-06-27 (×2): 8 [IU] via SUBCUTANEOUS

## 2017-06-26 MED ORDER — EPHEDRINE SULFATE-NACL 50-0.9 MG/10ML-% IV SOSY
PREFILLED_SYRINGE | INTRAVENOUS | Status: DC | PRN
  Administered 2017-06-26 (×2): 5 mg via INTRAVENOUS
  Administered 2017-06-26: 10 mg via INTRAVENOUS

## 2017-06-26 MED ORDER — BISACODYL 10 MG RE SUPP
10.0000 mg | Freq: Every day | RECTAL | Status: DC | PRN
Start: 2017-06-26 — End: 2017-06-27

## 2017-06-26 MED ORDER — PROPOFOL 10 MG/ML IV BOLUS
INTRAVENOUS | Status: DC | PRN
  Administered 2017-06-26: 150 mg via INTRAVENOUS

## 2017-06-26 MED ORDER — ONDANSETRON HCL 4 MG/2ML IJ SOLN: 4.0000 mg | Freq: Four times a day (QID) | INTRAMUSCULAR | Status: DC | PRN

## 2017-06-26 MED ORDER — DIPHENHYDRAMINE HCL 12.5 MG/5ML PO ELIX: 12.5000 mg | ORAL_SOLUTION | ORAL | Status: DC | PRN

## 2017-06-26 MED ORDER — GLYBURIDE 5 MG PO TABS
5.0000 mg | ORAL_TABLET | Freq: Two times a day (BID) | ORAL | Status: DC
  Administered 2017-06-27: 5 mg via ORAL
  Filled 2017-06-26 (×2): qty 1

## 2017-06-26 MED ORDER — SIMVASTATIN 20 MG PO TABS
20.0000 mg | ORAL_TABLET | Freq: Every day | ORAL | Status: DC
  Administered 2017-06-26: 20 mg via ORAL
  Filled 2017-06-26: qty 1

## 2017-06-26 MED ORDER — MORPHINE SULFATE (PF) 2 MG/ML IV SOLN: 0.5000 mg | INTRAVENOUS | Status: DC | PRN

## 2017-06-26 MED ORDER — PHENYLEPHRINE 40 MCG/ML (10ML) SYRINGE FOR IV PUSH (FOR BLOOD PRESSURE SUPPORT)
PREFILLED_SYRINGE | INTRAVENOUS | Status: AC
  Filled 2017-06-26: qty 20

## 2017-06-26 MED ORDER — AMLODIPINE BESYLATE 10 MG PO TABS: 10.0000 mg | ORAL_TABLET | Freq: Every day | ORAL | Status: DC

## 2017-06-26 MED ORDER — METHOCARBAMOL 500 MG PO TABS
500.0000 mg | ORAL_TABLET | Freq: Four times a day (QID) | ORAL | Status: DC | PRN
  Administered 2017-06-27: 500 mg via ORAL
  Filled 2017-06-26: qty 1

## 2017-06-26 MED ORDER — PIOGLITAZONE HCL 45 MG PO TABS
45.0000 mg | ORAL_TABLET | Freq: Every day | ORAL | Status: DC
  Administered 2017-06-27: 11:00:00 45 mg via ORAL
  Filled 2017-06-26: qty 1

## 2017-06-26 MED ORDER — BUPIVACAINE-EPINEPHRINE 0.25% -1:200000 IJ SOLN
INTRAMUSCULAR | Status: AC
Start: 2017-06-26 — End: ?
  Filled 2017-06-26: qty 1

## 2017-06-26 MED ORDER — HYDROMORPHONE HCL 1 MG/ML IJ SOLN
0.2500 mg | INTRAMUSCULAR | Status: DC | PRN
  Administered 2017-06-26 (×4): 0.5 mg via INTRAVENOUS

## 2017-06-26 MED ORDER — HYDROCHLOROTHIAZIDE 25 MG PO TABS: 12.5000 mg | ORAL_TABLET | Freq: Every day | ORAL | Status: DC

## 2017-06-26 MED ORDER — DOCUSATE SODIUM 100 MG PO CAPS
100.0000 mg | ORAL_CAPSULE | Freq: Two times a day (BID) | ORAL | Status: DC
  Administered 2017-06-26 – 2017-06-27 (×2): 100 mg via ORAL
  Filled 2017-06-26 (×2): qty 1

## 2017-06-26 MED ORDER — ACETAMINOPHEN 10 MG/ML IV SOLN
INTRAVENOUS | Status: AC
  Filled 2017-06-26: qty 100

## 2017-06-26 MED ORDER — ACETAMINOPHEN 10 MG/ML IV SOLN
1000.0000 mg | Freq: Once | INTRAVENOUS | Status: AC
  Administered 2017-06-26: 1000 mg via INTRAVENOUS

## 2017-06-26 MED ORDER — STERILE WATER FOR IRRIGATION IR SOLN
Status: DC | PRN
  Administered 2017-06-26: 2000 mL

## 2017-06-26 MED ORDER — FAMOTIDINE 20 MG PO TABS
20.0000 mg | ORAL_TABLET | Freq: Two times a day (BID) | ORAL | Status: DC
  Administered 2017-06-27: 20 mg via ORAL
  Filled 2017-06-26: qty 1

## 2017-06-26 MED ORDER — CHLORHEXIDINE GLUCONATE 4 % EX LIQD: 60.0000 mL | Freq: Once | CUTANEOUS | Status: DC

## 2017-06-26 MED ORDER — LIDOCAINE 2% (20 MG/ML) 5 ML SYRINGE
INTRAMUSCULAR | Status: DC | PRN
  Administered 2017-06-26: 100 mg via INTRAVENOUS

## 2017-06-26 MED ORDER — KETOROLAC TROMETHAMINE 30 MG/ML IJ SOLN
15.0000 mg | Freq: Once | INTRAMUSCULAR | Status: DC | PRN
  Administered 2017-06-26: 15 mg via INTRAVENOUS

## 2017-06-26 MED ORDER — FENTANYL CITRATE (PF) 100 MCG/2ML IJ SOLN
INTRAMUSCULAR | Status: AC
  Filled 2017-06-26: qty 2

## 2017-06-26 MED ORDER — PROPOFOL 10 MG/ML IV BOLUS
INTRAVENOUS | Status: AC
  Filled 2017-06-26: qty 40

## 2017-06-26 MED ORDER — LOSARTAN POTASSIUM 50 MG PO TABS: 100.0000 mg | ORAL_TABLET | Freq: Every day | ORAL | Status: DC

## 2017-06-26 MED ORDER — 0.9 % SODIUM CHLORIDE (POUR BTL) OPTIME
TOPICAL | Status: DC | PRN
  Administered 2017-06-26: 1000 mL

## 2017-06-26 MED ORDER — KETOROLAC TROMETHAMINE 15 MG/ML IJ SOLN
INTRAMUSCULAR | Status: AC
  Filled 2017-06-26: qty 1

## 2017-06-26 MED ORDER — SODIUM CHLORIDE 0.9 % IV SOLN
INTRAVENOUS | Status: DC
  Administered 2017-06-26: 20:00:00 via INTRAVENOUS

## 2017-06-26 MED ORDER — FLEET ENEMA 7-19 GM/118ML RE ENEM: 1.0000 | ENEMA | Freq: Once | RECTAL | Status: DC | PRN

## 2017-06-26 MED ORDER — FENTANYL CITRATE (PF) 100 MCG/2ML IJ SOLN
INTRAMUSCULAR | Status: DC | PRN
  Administered 2017-06-26 (×2): 50 ug via INTRAVENOUS

## 2017-06-26 MED ORDER — METOCLOPRAMIDE HCL 5 MG/ML IJ SOLN: 5.0000 mg | Freq: Three times a day (TID) | INTRAMUSCULAR | Status: DC | PRN

## 2017-06-26 SURGICAL SUPPLY — 64 items
ANCHOR SUPER QUICK (Anchor) ×6 IMPLANT
BAG ZIPLOCK 12X15 (MISCELLANEOUS) ×6 IMPLANT
BIT DRILL 2.8X128 (BIT) ×4 IMPLANT
BIT DRILL 2.8X128MM (BIT) ×2
BLADE EXTENDED COATED 6.5IN (ELECTRODE) ×3 IMPLANT
BLADE SAW SAG 73X25 THK (BLADE) ×1
BLADE SAW SGTL 73X25 THK (BLADE) ×2 IMPLANT
CLOSURE WOUND 1/2 X4 (GAUZE/BANDAGES/DRESSINGS) ×1
COVER SURGICAL LIGHT HANDLE (MISCELLANEOUS) ×3 IMPLANT
DRAPE INCISE IOBAN 66X45 STRL (DRAPES) ×3 IMPLANT
DRAPE ORTHO SPLIT 77X108 STRL (DRAPES) ×4
DRAPE POUCH INSTRU U-SHP 10X18 (DRAPES) ×3 IMPLANT
DRAPE SURG ORHT 6 SPLT 77X108 (DRAPES) ×2 IMPLANT
DRAPE U-SHAPE 47X51 STRL (DRAPES) ×3 IMPLANT
DRSG ADAPTIC 3X8 NADH LF (GAUZE/BANDAGES/DRESSINGS) ×3 IMPLANT
DRSG EMULSION OIL 3X16 NADH (GAUZE/BANDAGES/DRESSINGS) ×3 IMPLANT
DRSG MEPILEX BORDER 4X4 (GAUZE/BANDAGES/DRESSINGS) ×3 IMPLANT
DRSG MEPILEX BORDER 4X8 (GAUZE/BANDAGES/DRESSINGS) ×3 IMPLANT
DURAPREP 26ML APPLICATOR (WOUND CARE) ×3 IMPLANT
ELECT REM PT RETURN 15FT ADLT (MISCELLANEOUS) ×3 IMPLANT
EVACUATOR 1/8 PVC DRAIN (DRAIN) ×3 IMPLANT
FACESHIELD WRAPAROUND (MASK) ×12 IMPLANT
GAUZE SPONGE 4X4 12PLY STRL (GAUZE/BANDAGES/DRESSINGS) ×3 IMPLANT
GLOVE BIO SURGEON STRL SZ8 (GLOVE) ×6 IMPLANT
GLOVE BIOGEL PI IND STRL 6.5 (GLOVE) ×1 IMPLANT
GLOVE BIOGEL PI IND STRL 7.0 (GLOVE) ×2 IMPLANT
GLOVE BIOGEL PI IND STRL 7.5 (GLOVE) ×3 IMPLANT
GLOVE BIOGEL PI IND STRL 8 (GLOVE) ×1 IMPLANT
GLOVE BIOGEL PI INDICATOR 6.5 (GLOVE) ×2
GLOVE BIOGEL PI INDICATOR 7.0 (GLOVE) ×4
GLOVE BIOGEL PI INDICATOR 7.5 (GLOVE) ×6
GLOVE BIOGEL PI INDICATOR 8 (GLOVE) ×2
GLOVE SURG SS PI 6.5 STRL IVOR (GLOVE) ×6 IMPLANT
GLOVE SURG SS PI 7.0 STRL IVOR (GLOVE) ×3 IMPLANT
GOWN STRL REUS W/TWL LRG LVL3 (GOWN DISPOSABLE) ×6 IMPLANT
GOWN STRL REUS W/TWL XL LVL3 (GOWN DISPOSABLE) ×6 IMPLANT
HANDPIECE INTERPULSE COAX TIP (DISPOSABLE) ×2
HEAD FEMORAL 36MM (Hips) ×3 IMPLANT
IMMOBILIZER KNEE 20 (SOFTGOODS) ×3
IMMOBILIZER KNEE 20 THIGH 36 (SOFTGOODS) ×1 IMPLANT
MANIFOLD NEPTUNE II (INSTRUMENTS) ×3 IMPLANT
MARKER SKIN DUAL TIP RULER LAB (MISCELLANEOUS) ×3 IMPLANT
NDL SAFETY ECLIPSE 18X1.5 (NEEDLE) ×1 IMPLANT
NEEDLE HYPO 18GX1.5 SHARP (NEEDLE) ×2
PASSER SUT SWANSON 36MM LOOP (INSTRUMENTS) ×3 IMPLANT
POSITIONER SURGICAL ARM (MISCELLANEOUS) ×3 IMPLANT
SET HNDPC FAN SPRY TIP SCT (DISPOSABLE) ×1 IMPLANT
SPONGE LAP 18X18 X RAY DECT (DISPOSABLE) ×3 IMPLANT
STAPLER VISISTAT 35W (STAPLE) ×3 IMPLANT
STRIP CLOSURE SKIN 1/2X4 (GAUZE/BANDAGES/DRESSINGS) ×2 IMPLANT
SUCTION FRAZIER HANDLE 10FR (MISCELLANEOUS) ×2
SUCTION TUBE FRAZIER 10FR DISP (MISCELLANEOUS) ×1 IMPLANT
SUT ETHIBOND NAB CT1 #1 30IN (SUTURE) ×9 IMPLANT
SUT MNCRL AB 4-0 PS2 18 (SUTURE) ×3 IMPLANT
SUT STRATAFIX SPIRL PDS+ 45CM (SUTURE) ×6 IMPLANT
SUT VIC AB 1 CT1 27 (SUTURE) ×6
SUT VIC AB 1 CT1 27XBRD ANTBC (SUTURE) ×3 IMPLANT
SUT VIC AB 2-0 CT1 27 (SUTURE) ×6
SUT VIC AB 2-0 CT1 TAPERPNT 27 (SUTURE) ×3 IMPLANT
SYR 50ML LL SCALE MARK (SYRINGE) ×3 IMPLANT
TOWEL OR 17X26 10 PK STRL BLUE (TOWEL DISPOSABLE) ×6 IMPLANT
TRAY FOLEY CATH SILVER 14FR (SET/KITS/TRAYS/PACK) ×3 IMPLANT
TRAY FOLEY W/METER SILVER 16FR (SET/KITS/TRAYS/PACK) IMPLANT
YANKAUER SUCT BULB TIP 10FT TU (MISCELLANEOUS) ×3 IMPLANT

## 2017-06-26 NOTE — Anesthesia Preprocedure Evaluation (Signed)
Anesthesia Evaluation  Patient identified by MRN, date of birth, ID band Patient awake    Reviewed: Allergy & Precautions, NPO status , Patient's Chart, lab work & pertinent test results  History of Anesthesia Complications Negative for: history of anesthetic complications  Airway Mallampati: III  TM Distance: >3 FB Neck ROM: Full    Dental  (+) Chipped, Teeth Intact, Dental Advisory Given,    Pulmonary neg pulmonary ROS,    Pulmonary exam normal breath sounds clear to auscultation       Cardiovascular hypertension, Pt. on medications (-) angina(-) Past MI and (-) CHF Normal cardiovascular exam(-) dysrhythmias  Rhythm:Regular Rate:Normal     Neuro/Psych neg Seizures  Neuromuscular disease negative psych ROS   GI/Hepatic Neg liver ROS, GERD  Medicated and Controlled,  Endo/Other  diabetes, Type 2, Oral Hypoglycemic AgentsMorbid obesity  Renal/GU negative Renal ROS  negative genitourinary   Musculoskeletal  (+) Arthritis , Osteoarthritis,    Abdominal Normal abdominal exam  (+)   Peds  Hematology  (+) Blood dyscrasia, anemia ,   Anesthesia Other Findings   Reproductive/Obstetrics                             Anesthesia Physical  Anesthesia Plan  ASA: II  Anesthesia Plan: General   Post-op Pain Management:    Induction: Intravenous  PONV Risk Score and Plan: 2 and Ondansetron and Dexamethasone  Airway Management Planned: Oral ETT  Additional Equipment: None  Intra-op Plan:   Post-operative Plan: Extubation in OR  Informed Consent: I have reviewed the patients History and Physical, chart, labs and discussed the procedure including the risks, benefits and alternatives for the proposed anesthesia with the patient or authorized representative who has indicated his/her understanding and acceptance.   Dental advisory given  Plan Discussed with: CRNA and Surgeon  Anesthesia Plan  Comments:         Anesthesia Quick Evaluation

## 2017-06-26 NOTE — Progress Notes (Signed)
06/26/17 2215 Nursing Matt Babish PA called reg cbg of 442 tonight. Order received for 5 units novolog tonight.

## 2017-06-26 NOTE — Anesthesia Postprocedure Evaluation (Signed)
Anesthesia Post Note  Patient: Lance Hill  Procedure(s) Performed: Right hip femoral head revision and abductor tendon repair (Right Hip)     Patient location during evaluation: PACU Anesthesia Type: General Level of consciousness: awake and sedated Pain management: pain level controlled Vital Signs Assessment: post-procedure vital signs reviewed and stable Respiratory status: spontaneous breathing Cardiovascular status: stable Postop Assessment: no apparent nausea or vomiting Anesthetic complications: no    Last Vitals:  Vitals:   06/26/17 1545 06/26/17 1600  BP: 111/75 128/69  Pulse: 82 76  Resp: 10 14  Temp:  36.4 C  SpO2: 100% 100%    Last Pain:  Vitals:   06/26/17 1615  TempSrc:   PainSc: Asleep   Pain Goal: Patients Stated Pain Goal: 4 (06/26/17 1209)               Cori Justus JR,JOHN Susann GivensFRANKLIN

## 2017-06-26 NOTE — Transfer of Care (Signed)
Immediate Anesthesia Transfer of Care Note  Patient: Hilbert OdorGerald Cerone  Procedure(s) Performed: Right hip femoral head revision and abductor tendon repair (Right Hip)  Patient Location: PACU  Anesthesia Type:General  Level of Consciousness: awake, alert  and oriented  Airway & Oxygen Therapy: Patient Spontanous Breathing and Patient connected to face mask oxygen  Post-op Assessment: Report given to RN  Post vital signs: Reviewed and stable  Last Vitals:  Vitals:   06/26/17 1155  BP: (!) 144/78  Pulse: 83  Resp: 18  Temp: 36.5 C  SpO2: 100%    Last Pain:  Vitals:   06/26/17 1209  TempSrc:   PainSc: 3       Patients Stated Pain Goal: 4 (06/26/17 1209)  Complications: No apparent anesthesia complications

## 2017-06-26 NOTE — Anesthesia Procedure Notes (Signed)
Procedure Name: Intubation Date/Time: 06/26/2017 1:44 PM Performed by: Lavina Hamman, CRNA Pre-anesthesia Checklist: Patient identified, Emergency Drugs available, Suction available, Patient being monitored and Timeout performed Patient Re-evaluated:Patient Re-evaluated prior to induction Oxygen Delivery Method: Circle system utilized Preoxygenation: Pre-oxygenation with 100% oxygen Induction Type: IV induction Ventilation: Mask ventilation without difficulty Laryngoscope Size: Mac and 4 Grade View: Grade I Tube type: Oral Tube size: 7.5 mm Number of attempts: 1 Airway Equipment and Method: Stylet Placement Confirmation: ETT inserted through vocal cords under direct vision,  positive ETCO2,  CO2 detector and breath sounds checked- equal and bilateral Secured at: 22 cm Tube secured with: Tape Dental Injury: Teeth and Oropharynx as per pre-operative assessment

## 2017-06-26 NOTE — Interval H&P Note (Signed)
History and Physical Interval Note:  06/26/2017 12:01 PM  Lance Hill  has presented today for surgery, with the diagnosis of Failed right hip replacement  The various methods of treatment have been discussed with the patient and family. After consideration of risks, benefits and other options for treatment, the patient has consented to  Procedure(s): Right hip bursectomy; polyethylene versus acetabular revision (Right) as a surgical intervention .  The patient's history has been reviewed, patient examined, no change in status, stable for surgery.  I have reviewed the patient's chart and labs.  Questions were answered to the patient's satisfaction.     [name]

## 2017-06-26 NOTE — Brief Op Note (Signed)
06/26/2017  3:19 PM  PATIENT:  Hilbert OdorGerald Capece  72 y.o. male  PRE-OPERATIVE DIAGNOSIS:  Failed right hip replacement  POST-OPERATIVE DIAGNOSIS:  Failed right hip replacement  PROCEDURE:  Procedure(s): Right hip femoral head revision and abductor tendon repair (Right)  SURGEON:  Surgeon(s) and Role:    Ollen Gross* Mabry Santarelli, MD - Primary  PHYSICIAN ASSISTANT:   ASSISTANTS: Dimitri PedAmber Constable, PA-C   ANESTHESIA:   general  EBL:  150 mL   BLOOD ADMINISTERED:none  DRAINS: (Medium) Hemovact drain(s) in the right hip with  Suction Open   LOCAL MEDICATIONS USED:  MARCAINE     COUNTS:  YES  TOURNIQUET:  * No tourniquets in log *  DICTATION: .Other Dictation: Dictation Number [id number]  PLAN OF CARE: Admit for overnight observation  PATIENT DISPOSITION:  PACU - hemodynamically stable.

## 2017-06-27 LAB — CBC
HCT: 31 % — ABNORMAL LOW (ref 39.0–52.0)
Hemoglobin: 10.5 g/dL — ABNORMAL LOW (ref 13.0–17.0)
MCH: 31.5 pg (ref 26.0–34.0)
MCHC: 33.9 g/dL (ref 30.0–36.0)
MCV: 93.1 fL (ref 78.0–100.0)
PLATELETS: 169 10*3/uL (ref 150–400)
RBC: 3.33 MIL/uL — ABNORMAL LOW (ref 4.22–5.81)
RDW: 13.2 % (ref 11.5–15.5)
WBC: 12 10*3/uL — ABNORMAL HIGH (ref 4.0–10.5)

## 2017-06-27 LAB — BASIC METABOLIC PANEL
Anion gap: 6 (ref 5–15)
BUN: 30 mg/dL — ABNORMAL HIGH (ref 6–20)
CHLORIDE: 106 mmol/L (ref 101–111)
CO2: 24 mmol/L (ref 22–32)
CREATININE: 1.43 mg/dL — AB (ref 0.61–1.24)
Calcium: 8.4 mg/dL — ABNORMAL LOW (ref 8.9–10.3)
GFR calc Af Amer: 55 mL/min — ABNORMAL LOW (ref >60)
GFR calc non Af Amer: 47 mL/min — ABNORMAL LOW (ref >60)
GLUCOSE: 311 mg/dL — AB (ref 65–99)
POTASSIUM: 5 mmol/L (ref 3.5–5.1)
SODIUM: 136 mmol/L (ref 135–145)

## 2017-06-27 LAB — GLUCOSE, CAPILLARY
GLUCOSE-CAPILLARY: 300 mg/dL — AB (ref 65–99)
Glucose-Capillary: 292 mg/dL — ABNORMAL HIGH (ref 65–99)

## 2017-06-27 MED ORDER — HYDROCODONE-ACETAMINOPHEN 5-325 MG PO TABS: 1.0000 | ORAL_TABLET | ORAL | 0 refills | Status: DC | PRN

## 2017-06-27 MED ORDER — TRAMADOL HCL 50 MG PO TABS: 50.0000 mg | ORAL_TABLET | Freq: Four times a day (QID) | ORAL | 0 refills | Status: DC | PRN

## 2017-06-27 MED ORDER — METHOCARBAMOL 500 MG PO TABS: 500.0000 mg | ORAL_TABLET | Freq: Four times a day (QID) | ORAL | 0 refills | Status: DC | PRN

## 2017-06-27 MED ORDER — ASPIRIN 325 MG PO TBEC
325.0000 mg | DELAYED_RELEASE_TABLET | Freq: Every day | ORAL | 0 refills | Status: DC
Start: 2017-06-28 — End: 2018-08-20

## 2017-06-27 NOTE — Discharge Instructions (Signed)
Dr. Gaynelle Arabian Total Joint Specialist [facility] [address], [place], [place] [address] [phone]   POSTERIOR TOTAL HIP REPLACEMENT POSTOPERATIVE DIRECTIONS  Hip Rehabilitation, Guidelines Following Surgery  The results of a hip operation are greatly improved after range of motion and muscle strengthening exercises. Follow all safety measures which are given to protect your hip. If any of these exercises cause increased pain or swelling in your joint, decrease the amount until you are comfortable again. Then slowly increase the exercises. Call your caregiver if you have problems or questions.   HOME CARE INSTRUCTIONS  Remove items at home which could result in a fall. This includes throw rugs or furniture in walking pathways.   ICE to the affected hip every three hours for 30 minutes at a time and then as needed for pain and swelling.  Continue to use ice on the hip for pain and swelling from surgery. You may notice swelling that will progress down to the foot and ankle.  This is normal after surgery.  Elevate the leg when you are not up walking on it.    Continue to use the breathing machine which will help keep your temperature down.  It is common for your temperature to cycle up and down following surgery, especially at night when you are not up moving around and exerting yourself.  The breathing machine keeps your lungs expanded and your temperature down.  DIET You may resume your previous home diet once your are discharged from the hospital.  DRESSING / WOUND CARE / SHOWERING You may shower 3 days after surgery, but keep the wounds dry during showering.  You may use an occlusive plastic wrap (Press'n Seal for example), NO SOAKING/SUBMERGING IN THE BATHTUB.  If the bandage gets wet, change with a clean dry gauze.  If the incision gets wet, pat the wound dry with a clean towel. You may start showering once you are discharged home but do not submerge the incision  under water. Just pat the incision dry and apply a dry gauze dressing on daily. Change the surgical dressing daily and reapply a dry dressing each time.    ACTIVITY Walk with your walker as instructed. Use walker as long as suggested by your caregivers. Avoid periods of inactivity such as sitting longer than an hour when not asleep. This helps prevent blood clots.  You may resume a sexual relationship in one month or when given the OK by your doctor.  You may return to work once you are cleared by your doctor.  Do not drive a car for 6 weeks or until released by you surgeon.  Do not drive while taking narcotics.  WEIGHT BEARING Weight bearing as tolerated with assist device (walker, cane, etc) as directed, use it as long as suggested by your surgeon or therapist, typically at least 4-6 weeks.  POSTOPERATIVE CONSTIPATION PROTOCOL Constipation - defined medically as fewer than three stools per week and severe constipation as less than one stool per week.  One of the most common issues patients have following surgery is constipation.  Even if you have a regular bowel pattern at home, your normal regimen is likely to be disrupted due to multiple reasons following surgery.  Combination of anesthesia, postoperative narcotics, change in appetite and fluid intake all can affect your bowels.  In order to avoid complications following surgery, here are some recommendations in order to help you during your recovery period.  Colace (docusate) - Pick up an over-the-counter  form of Colace or another stool softener and take twice a day as long as you are requiring postoperative pain medications.  Take with a full glass of water daily.  If you experience loose stools or diarrhea, hold the colace until you stool forms back up.  If your symptoms do not get better within 1 week or if they get worse, check with your doctor.  Dulcolax (bisacodyl) - Pick up over-the-counter and take as directed by the product  packaging as needed to assist with the movement of your bowels.  Take with a full glass of water.  Use this product as needed if not relieved by Colace only.   MiraLax (polyethylene glycol) - Pick up over-the-counter to have on hand.  MiraLax is a solution that will increase the amount of water in your bowels to assist with bowel movements.  Take as directed and can mix with a glass of water, juice, soda, coffee, or tea.  Take if you go more than two days without a movement. Do not use MiraLax more than once per day. Call your doctor if you are still constipated or irregular after using this medication for 7 days in a row.  If you continue to have problems with postoperative constipation, please contact the office for further assistance and recommendations.  If you experience "the worst abdominal pain ever" or develop nausea or vomiting, please contact the office immediatly for further recommendations for treatment.  ITCHING  If you experience itching with your medications, try taking only a single pain pill, or even half a pain pill at a time.  You can also use Benadryl over the counter for itching or also to help with sleep.   TED HOSE STOCKINGS Wear the elastic stockings on both legs for three weeks following surgery during the day but you may remove then at night for sleeping.  MEDICATIONS See your medication summary on the After Visit Summary that the nursing staff will review with you prior to discharge.  You may have some home medications which will be placed on hold until you complete the course of blood thinner medication.  It is important for you to complete the blood thinner medication as prescribed by your surgeon.  Continue your approved medications as instructed at time of discharge.  PRECAUTIONS If you experience chest pain or shortness of breath - call 911 immediately for transfer to the hospital emergency department.  If you develop a fever greater that 101 F, purulent drainage  from wound, increased redness or drainage from wound, foul odor from the wound/dressing, or calf pain - CONTACT YOUR SURGEON.                                                   FOLLOW-UP APPOINTMENTS Make sure you keep all of your appointments after your operation with your surgeon and caregivers. You should call the office at the above phone number and make an appointment for approximately two weeks after the date of your surgery or on the date instructed by your surgeon outlined in the "After Visit Summary".    IF YOU ARE TRANSFERRED TO A SKILLED REHAB FACILITY If the patient is transferred to a skilled rehab facility following release from the hospital, a list of the current medications will be sent to the facility for the patient to continue.  When discharged from  the skilled rehab facility, please have the facility set up the patient's Home Health Physical Therapy prior to being released. Also, the skilled facility will be responsible for providing the patient with their medications at time of release from the facility to include their pain medication, the muscle relaxants, and their blood thinner medication. If the patient is still at the rehab facility at time of the two week follow up appointment, the skilled rehab facility will also need to assist the patient in arranging follow up appointment in our office and any transportation needs.  MAKE SURE YOU:  Understand these instructions.  Get help right away if you are not doing well or get worse.    Pick up stool softner and laxative for home use following surgery while on pain medications. Do not submerge incision under water. Please use good hand washing techniques while changing dressing each day. May shower starting three days after surgery. Please use a clean towel to pat the incision dry following showers. Continue to use ice for pain and swelling after surgery. Do not use any lotions or creams on the incision until instructed by your  surgeon.  Take a full dose enteric coated 325 mg Aspirin daily for three weeks.   Once the patient has completed the three weeks of full dose Aspirin, then they may reduce back to the 81 mg Aspirin daily at home.

## 2017-06-27 NOTE — Discharge Summary (Signed)
Physician Discharge Summary   Patient ID: Lance Hill MRN: [id number] DOB/AGE: Sep 11, 1945 72 y.o.  Admit date: 06/26/2017 Discharge date: 06-27-2017  Primary Diagnosis:  Painful right total hip arthroplasty.   Admission Diagnoses:  Past Medical History:  Diagnosis Date  . Arthritis   . Diabetes mellitus without complication ([place])    type II   . GERD (gastroesophageal reflux disease)   . History of kidney stones    hx of   . Hypertension    Discharge Diagnoses:   Principal Problem:   Failed total hip arthroplasty, subsequent encounter Active Problems:   Failed total hip arthroplasty, sequela  Estimated body mass index is 29.23 kg/m as calculated from the following:   Height as of this encounter: 5' 8.5" (1.74 m).   Weight as of this encounter: 88.5 kg (195 lb 1.7 oz).  Procedure(s) (LRB): Right hip femoral head revision and abductor tendon repair (Right)   Consults: None  HPI: Mr. Lance Hill is a 72 year old male who had a right total hip arthroplasty done in [place] couple years ago.  He has had significant lateral hip pain and his workup included a negative bone scan.  An MRI showed fluid laterally.  He has had extensive nonoperative management and it was felt that he either has an abductor tendon tear or he may have abnormal mechanics going onto the hip with excessive polyethylene wear.  He presents now for abductor mechanism repair and possible revision.   Laboratory Data: Admission on 06/26/2017  Component Date Value Ref Range Status  . Glucose-Capillary 06/26/2017 144* 65 - 99 mg/dL Final  . Glucose-Capillary 06/26/2017 169* 65 - 99 mg/dL Final  . WBC 06/27/2017 12.0* 4.0 - 10.5 K/uL Final  . RBC 06/27/2017 3.33* 4.22 - 5.81 MIL/uL Final  . Hemoglobin 06/27/2017 10.5* 13.0 - 17.0 g/dL Final  . HCT 06/27/2017 31.0* 39.0 - 52.0 % Final  . MCV 06/27/2017 93.1  78.0 - 100.0 fL Final  . MCH 06/27/2017 31.5  26.0 - 34.0 pg Final  . MCHC 06/27/2017 33.9  30.0 - 36.0  g/dL Final  . RDW 06/27/2017 13.2  11.5 - 15.5 % Final  . Platelets 06/27/2017 169  150 - 400 K/uL Final   Performed at [facility], [place] [address] [place], [place] [address]  . Sodium 06/27/2017 136  135 - 145 mmol/L Final  . Potassium 06/27/2017 5.0  3.5 - 5.1 mmol/L Final  . Chloride 06/27/2017 106  101 - 111 mmol/L Final  . CO2 06/27/2017 24  22 - 32 mmol/L Final  . Glucose, Bld 06/27/2017 311* 65 - 99 mg/dL Final  . BUN 06/27/2017 30* 6 - 20 mg/dL Final  . Creatinine, Ser 06/27/2017 1.43* 0.61 - 1.24 mg/dL Final  . Calcium 06/27/2017 8.4* 8.9 - 10.3 mg/dL Final  . GFR calc non Af Amer 06/27/2017 47* >60 mL/min Final  . GFR calc Af Amer 06/27/2017 55* >60 mL/min Final   Comment: (NOTE) The eGFR has been calculated using the CKD EPI equation. This calculation has not been validated in all clinical situations. eGFR's persistently <60 mL/min signify possible Chronic Kidney Disease.   Georgiann Hahn gap 06/27/2017 6  5 - 15 Final   Performed at [facility], [place] [address], [place], [place] [address]  . Glucose-Capillary 06/26/2017 442* 65 - 99 mg/dL Final  . Glucose-Capillary 06/26/2017 461* 65 - 99 mg/dL Final  . Glucose-Capillary 06/26/2017 250* 65 - 99 mg/dL Final  . Glucose-Capillary 06/27/2017 292* 65 - 99  mg/dL Final  Hospital Outpatient Visit on 06/20/2017  Component Date Value Ref Range Status  . Glucose-Capillary 06/20/2017 139* 65 - 99 mg/dL Final  . MRSA, PCR 06/20/2017 NEGATIVE  NEGATIVE Final  . Staphylococcus aureus 06/20/2017 POSITIVE* NEGATIVE Final   Comment: (NOTE) The Xpert SA Assay (FDA approved for NASAL specimens in patients 87 years of age and older), is one component of a comprehensive surveillance program. It is not intended to diagnose infection nor to guide or monitor treatment. Performed at [facility], [place] [address] [place], [place] [address]   . Hgb A1c MFr Bld 06/20/2017 6.9* 4.8 - 5.6 %  Final   Comment: (NOTE) Pre diabetes:          5.7%-6.4% Diabetes:              >6.4% Glycemic control for   <7.0% adults with diabetes   . Mean Plasma Glucose 06/20/2017 151.33  mg/dL Final   Performed at [place] [address] [place], [place] [address]  . aPTT 06/20/2017 34  24 - 36 seconds Final   Performed at [facility], [place] [address] [place], [place] [address]  . WBC 06/20/2017 7.2  4.0 - 10.5 K/uL Final  . RBC 06/20/2017 4.16* 4.22 - 5.81 MIL/uL Final  . Hemoglobin 06/20/2017 12.8* 13.0 - 17.0 g/dL Final  . HCT 06/20/2017 38.5* 39.0 - 52.0 % Final  . MCV 06/20/2017 92.5  78.0 - 100.0 fL Final  . MCH 06/20/2017 30.8  26.0 - 34.0 pg Final  . MCHC 06/20/2017 33.2  30.0 - 36.0 g/dL Final  . RDW 06/20/2017 12.9  11.5 - 15.5 % Final  . Platelets 06/20/2017 190  150 - 400 K/uL Final   Performed at [facility], [place] [address], [place], [place] [address]  . Sodium 06/20/2017 139  135 - 145 mmol/L Final  . Potassium 06/20/2017 4.2  3.5 - 5.1 mmol/L Final  . Chloride 06/20/2017 103  101 - 111 mmol/L Final  . CO2 06/20/2017 25  22 - 32 mmol/L Final  . Glucose, Bld 06/20/2017 134* 65 - 99 mg/dL Final  . BUN 06/20/2017 29* 6 - 20 mg/dL Final  . Creatinine, Ser 06/20/2017 1.31* 0.61 - 1.24 mg/dL Final  . Calcium 06/20/2017 9.4  8.9 - 10.3 mg/dL Final  . Total Protein 06/20/2017 7.4  6.5 - 8.1 g/dL Final  . Albumin 06/20/2017 3.9  3.5 - 5.0 g/dL Final  . AST 06/20/2017 23  15 - 41 U/L Final  . ALT 06/20/2017 21  17 - 63 U/L Final  . Alkaline Phosphatase 06/20/2017 56  38 - 126 U/L Final  . Total Bilirubin 06/20/2017 0.8  0.3 - 1.2 mg/dL Final  . GFR calc non Af Amer 06/20/2017 52* >60 mL/min Final  . GFR calc Af Amer 06/20/2017 >60  >60 mL/min Final   Comment: (NOTE) The eGFR has been calculated using the CKD EPI equation. This calculation has not been validated in all clinical situations. eGFR's persistently <60 mL/min signify  possible Chronic Kidney Disease.   Georgiann Hahn gap 06/20/2017 11  5 - 15 Final   Performed at [facility], [place] [address] [place], [place] [address]  . Prothrombin Time 06/20/2017 14.0  11.4 - 15.2 seconds Final  . INR 06/20/2017 1.09   Final   Performed at [facility], [place] [address] [place], [place] [address]  . ABO/RH(D) 06/20/2017 O POS   Final  .  Antibody Screen 06/20/2017 NEG   Final  . Sample Expiration 06/20/2017 06/29/2017   Final  . Extend sample reason 06/20/2017    Final                   Value:NO TRANSFUSIONS OR PREGNANCY IN THE PAST 3 MONTHS Performed at [place] [address] [place], [place] [address]   . ABO/RH(D) 06/20/2017    Final                   Value:O POS Performed at [facility], [place] [address] [place], [place] [address]      X-Rays:No results found.  EKG:No orders found for this or any previous visit.   Hospital Course: Patient was admitted to [facility] and taken to the OR and underwent the above state procedure without complications.  Patient tolerated the procedure well and was later transferred to the recovery room and then to the orthopaedic floor for postoperative care.  They were given PO and IV analgesics for pain control following their surgery.  They were given 24 hours of postoperative antibiotics of  Anti-infectives (From admission, onward)   Start     Dose/Rate Route Frequency Ordered Stop   06/26/17 2000  ceFAZolin (ANCEF) IVPB 2g/100 mL premix     2 g 200 mL/hr over 30 Minutes Intravenous Every 6 hours 06/26/17 1720 06/27/17 0151   06/26/17 1147  ceFAZolin (ANCEF) 2-4 GM/100ML-% IVPB    Comments:  [place] Session   : cabinet override      06/26/17 1147 06/26/17 1345   06/26/17 1145  ceFAZolin (ANCEF) IVPB 2g/100 mL premix     2 g 200 mL/hr over 30 Minutes Intravenous On call to O.R. 06/26/17 1145 06/26/17 1345     and started on DVT prophylaxis  in the form of Aspirin.   PT and OT were ordered for total hip protocol.  The patient was allowed to be WBAT with therapy. Discharge planning was consulted to help with postop disposition and equipment needs.  Patient had a good night on the evening of surgery.  They started to get up OOB with therapy on day one.  Hemovac drain was pulled without difficulty. Dressing was checked and was clean and dry. Patient was seen in rounds and was ready to go home.  DC Home Diet - Cardiac diet and Diabetic diet Follow up - in 2 weeks Activity - WBAT Disposition - Home Condition Upon Discharge - pending therapy D/C Meds - See DC Summary DVT Prophylaxis - Aspirin 325 mg  Posterior Hip Precautions No Active Abduction of Hip No Active Extension of Hip Only Walking - No HHPT    Discharge Instructions    Call MD / Call 911   Complete by:  As directed    If you experience chest pain or shortness of breath, CALL 911 and be transported to the hospital emergency room.  If you develope a fever above 101 F, pus (white drainage) or increased drainage or redness at the wound, or calf pain, call your surgeon's office.   Change dressing   Complete by:  As directed    You may change your dressing dressing daily with sterile 4 x 4 inch gauze dressing and paper tape.  Do not submerge the incision under water.   Constipation Prevention   Complete by:  As directed    Drink plenty of fluids.  Prune juice may be helpful.  You may use a  stool softener, such as Colace (over the counter) 100 mg twice a day.  Use MiraLax (over the counter) for constipation as needed.   Diet - low sodium heart healthy   Complete by:  As directed    Diet Carb Modified   Complete by:  As directed    Discharge instructions   Complete by:  As directed    Take a full dose enteric coated 325 mg Aspirin daily for three weeks.   Once the patient has completed the three weeks of full dose Aspirin, then they may reduce back to the 81 mg Aspirin  daily at home.  Pick up stool softner and laxative for home use following surgery while on pain medications. Do not submerge incision under water. Please use good hand washing techniques while changing dressing each day. May shower starting three days after surgery. Please use a clean towel to pat the incision dry following showers. Continue to use ice for pain and swelling after surgery. Do not use any lotions or creams on the incision until instructed by your surgeon.  Wear both TED hose on both legs during the day every day for three weeks, but may remove the TED hose at night at home.  Postoperative Constipation Protocol  Constipation - defined medically as fewer than three stools per week and severe constipation as less than one stool per week.  One of the most common issues patients have following surgery is constipation.  Even if you have a regular bowel pattern at home, your normal regimen is likely to be disrupted due to multiple reasons following surgery.  Combination of anesthesia, postoperative narcotics, change in appetite and fluid intake all can affect your bowels.  In order to avoid complications following surgery, here are some recommendations in order to help you during your recovery period.  Colace (docusate) - Pick up an over-the-counter form of Colace or another stool softener and take twice a day as long as you are requiring postoperative pain medications.  Take with a full glass of water daily.  If you experience loose stools or diarrhea, hold the colace until you stool forms back up.  If your symptoms do not get better within 1 week or if they get worse, check with your doctor.  Dulcolax (bisacodyl) - Pick up over-the-counter and take as directed by the product packaging as needed to assist with the movement of your bowels.  Take with a full glass of water.  Use this product as needed if not relieved by Colace only.   MiraLax (polyethylene glycol) - Pick up  over-the-counter to have on hand.  MiraLax is a solution that will increase the amount of water in your bowels to assist with bowel movements.  Take as directed and can mix with a glass of water, juice, soda, coffee, or tea.  Take if you go more than two days without a movement. Do not use MiraLax more than once per day. Call your doctor if you are still constipated or irregular after using this medication for 7 days in a row.  If you continue to have problems with postoperative constipation, please contact the office for further assistance and recommendations.  If you experience "the worst abdominal pain ever" or develop nausea or vomiting, please contact the office immediatly for further recommendations for treatment.   Do not sit on low chairs, stoools or toilet seats, as it may be difficult to get up from low surfaces   Complete by:  As directed  Driving restrictions   Complete by:  As directed    No driving until released by the physician.   Follow the hip precautions as taught in Physical Therapy   Complete by:  As directed    Posterior Hip Precautions No Abduction of the Hip No Extension of the Hip   Increase activity slowly as tolerated   Complete by:  As directed    Lifting restrictions   Complete by:  As directed    No lifting until released by the physician.   Patient may shower   Complete by:  As directed    You may shower without a dressing once there is no drainage.  Do not wash over the wound.  If drainage remains, do not shower until drainage stops.   TED hose   Complete by:  As directed    Use stockings (TED hose) for 3 weeks on both leg(s).  You may remove them at night for sleeping.   Weight bearing as tolerated   Complete by:  As directed    Laterality:  right   Extremity:  Lower     Allergies as of 06/27/2017   No Known Allergies     Medication List    STOP taking these medications   BIOFREEZE EX   Fish Oil 1200 MG Caps   multivitamin with minerals Tabs  tablet   naproxen sodium 220 MG tablet Commonly known as:  ALEVE   OVER THE COUNTER MEDICATION     TAKE these medications   acetaminophen 325 MG tablet Commonly known as:  TYLENOL Take 650 mg by mouth every 6 (six) hours as needed for moderate pain.   amLODipine 10 MG tablet Commonly known as:  NORVASC Take 10 mg by mouth at bedtime.   ARTIFICIAL TEAR OP Place 1 drop into both eyes daily as needed (for dry eyes).   aspirin 325 MG EC tablet Take 1 tablet (325 mg total) by mouth daily with breakfast. Take a full dose enteric coated 325 mg Aspirin daily for three weeks.   Once the patient has completed the three weeks of full dose Aspirin, then they may reduce back to the 81 mg Aspirin daily at home. Start taking on:  06/28/2017 What changed:    medication strength  how much to take  when to take this  additional instructions   Brinzolamide-Brimonidine 1-0.2 % Susp Place 1 drop into both eyes 3 (three) times daily.   [employer] VITAMIN D3 1000 units capsule Generic drug:  Cholecalciferol Take 2,000 Units by mouth daily.   docusate sodium 100 MG capsule Commonly known as:  COLACE Take 1 capsule (100 mg total) by mouth 2 (two) times daily as needed for mild constipation.   gabapentin 300 MG capsule Commonly known as:  NEURONTIN Take 300 mg by mouth 2 (two) times daily.   glyBURIDE 5 MG tablet Commonly known as:  DIABETA Take 5 mg by mouth 2 (two) times daily with a meal.   hydrochlorothiazide 25 MG tablet Commonly known as:  HYDRODIURIL Take 12.5 mg by mouth daily.   HYDROcodone-acetaminophen 5-325 MG tablet Commonly known as:  NORCO/VICODIN Take 1-2 tablets by mouth every 4 (four) hours as needed for moderate pain (pain score 4-6).   loratadine 10 MG tablet Commonly known as:  CLARITIN Take 10 mg by mouth daily.   losartan 100 MG tablet Commonly known as:  COZAAR Take 100 mg by mouth daily.   magnesium oxide 400 MG tablet Commonly known as:  MAG-OX Take  400  mg by mouth daily.   metFORMIN 500 MG tablet Commonly known as:  GLUCOPHAGE Take 500 mg by mouth 2 (two) times daily with a meal.   methocarbamol 500 MG tablet Commonly known as:  ROBAXIN Take 1 tablet (500 mg total) by mouth every 6 (six) hours as needed for muscle spasms.   pioglitazone 45 MG tablet Commonly known as:  ACTOS Take 45 mg by mouth daily.   polyethylene glycol packet Commonly known as:  MIRALAX / GLYCOLAX Take 17 g by mouth daily.   ranitidine 300 MG tablet Commonly known as:  ZANTAC Take 150 mg by mouth 2 (two) times daily.   simvastatin 20 MG tablet Commonly known as:  ZOCOR Take 20 mg by mouth at bedtime.   traMADol 50 MG tablet Commonly known as:  ULTRAM Take 1-2 tablets (50-100 mg total) by mouth every 6 (six) hours as needed (mild pain). What changed:    how much to take  reasons to take this            Discharge Care Instructions  (From admission, onward)        Start     Ordered   06/27/17 0000  Weight bearing as tolerated    Question Answer Comment  Laterality right   Extremity Lower      06/27/17 0850   06/27/17 0000  Change dressing    Comments:  You may change your dressing dressing daily with sterile 4 x 4 inch gauze dressing and paper tape.  Do not submerge the incision under water.   06/27/17 0850     Follow-up Information    Gaynelle Arabian, MD. Schedule an appointment as soon as possible for a visit in 2 week(s).   Specialty:  Orthopedic Surgery Contact information: [address] [place] [address] [phone]           Signed: Arlee Muslim, PA-C Orthopaedic Surgery 06/27/2017, 8:52 AM

## 2017-06-27 NOTE — Progress Notes (Signed)
Physical Therapy Treatment Patient Details Name: Lance Hill MRN: [id number] DOB: [birth year] Today's Date: 06/27/2017    History of Present Illness Pt s/p R THR femoral head revision and abductor tendon repair    PT Comments    Pt progressing well with mobility but continues to require cueing to follow precautions.  Reviewed in/out bed, car transfers and stairs with spouse.   Follow Up Recommendations  Follow surgeon's recommendation for DC plan and follow-up therapies     Equipment Recommendations  None recommended by PT    Recommendations for Other Services       Precautions / Restrictions Precautions Precautions: Fall;Other (comment);Posterior Hip Precaution Booklet Issued: Yes (comment) Precaution Comments: No Active Abduction and extension.  Per PA, no therex program, just mobilize Restrictions Weight Bearing Restrictions: No Other Position/Activity Restrictions: WBAT    Mobility  Bed Mobility Overal bed mobility: Needs Assistance Bed Mobility: Supine to Sit;Sit to Supine     Supine to sit: Min guard Sit to supine: Min assist   General bed mobility comments: cues for sequence and use of L LE to self assist.  Assist with R LE into bed to limit active abd  Transfers Overall transfer level: Needs assistance Equipment used: Rolling walker (2 wheeled) Transfers: Sit to/from Stand Sit to Stand: Min guard;Supervision         General transfer comment: cues for LE management and use of UEs to self assist  Ambulation/Gait Ambulation/Gait assistance: Min guard;Supervision Ambulation Distance (Feet): 111 Feet Assistive device: Rolling walker (2 wheeled) Gait Pattern/deviations: Step-to pattern;Step-through pattern;Decreased step length - right;Decreased step length - left;Shuffle;Trunk flexed Gait velocity: decr Gait velocity interpretation: Below normal speed for age/gender General Gait Details: cues for sequence, posture, position from RW and THP on  turns   Stairs Stairs: Yes   Stair Management: No rails;Step to pattern;Backwards;With walker Number of Stairs: 4 General stair comments: 2 steps twice with spouse assisting on second attempt.  Cues for sequence and foot/Rw placement  Wheelchair Mobility    Modified Rankin (Stroke Patients Only)       Balance Overall balance assessment: No apparent balance deficits (not formally assessed)                                          Cognition Arousal/Alertness: Awake/alert Behavior During Therapy: WFL for tasks assessed/performed Overall Cognitive Status: Within Functional Limits for tasks assessed                                        Exercises      General Comments        Pertinent Vitals/Pain Pain Assessment: 0-10 Pain Score: 4  Pain Location: R hip Pain Descriptors / Indicators: Aching;Sore Pain Intervention(s): Limited activity within patient's tolerance;Monitored during session;Premedicated before session    Home Living                      Prior Function            PT Goals (current goals can now be found in the care plan section) Acute Rehab PT Goals Patient Stated Goal: Regain IND and walk without pain PT Goal Formulation: With patient Time For Goal Achievement: 07/04/17 Potential to Achieve Goals: Good Progress towards PT goals: Progressing toward goals  Frequency    7X/week      PT Plan Current plan remains appropriate    Co-evaluation              AM-PAC PT "6 Clicks" Daily Activity  Outcome Measure  Difficulty turning over in bed (including adjusting bedclothes, sheets and blankets)?: A Lot Difficulty moving from lying on back to sitting on the side of the bed? : A Lot Difficulty sitting down on and standing up from a chair with arms (e.g., wheelchair, bedside commode, etc,.)?: A Lot Help needed moving to and from a bed to chair (including a wheelchair)?: A Little Help needed  walking in hospital room?: A Little Help needed climbing 3-5 steps with a railing? : A Little 6 Click Score: 15    End of Session Equipment Utilized During Treatment: Gait belt Activity Tolerance: Patient tolerated treatment well Patient left: with call bell/phone within reach;with family/visitor present;in bed Nurse Communication: Mobility status PT Visit Diagnosis: Difficulty in walking, not elsewhere classified (R26.2)     Time: [id number] PT Time Calculation (min) (ACUTE ONLY): 32 min  Charges:  $Gait Training: 8-22 mins $Therapeutic Activity: 8-22 mins                    G Codes:       Pg [phone]    [name] 06/27/2017, 6:19 PM

## 2017-06-27 NOTE — Op Note (Signed)
NAMEvette Georges:  KOBLE, Camarion                ACCOUNT NO.:  [id number]  MEDICAL RECORD NO.:  [id number]  LOCATION:                                 FACILITY:  PHYSICIAN:  Ollen GrossFrank Sahmya Arai, M.D.         DATE OF BIRTH:  DATE OF PROCEDURE:  06/26/2017 DATE OF DISCHARGE:                              OPERATIVE REPORT   PREOPERATIVE DIAGNOSIS:  Painful right total hip arthroplasty.  POSTOPERATIVE DIAGNOSIS:  Painful right total hip arthroplasty.  PROCEDURE:  Right hip femoral head revision with abductor mechanism repair.  SURGEON:  Ollen GrossFrank Tearra Ouk, M.D.  ASSISTANT:  Dimitri PedAmber Constable, PA-C.  ANESTHESIA:  General.  ESTIMATED BLOOD LOSS:  150.  DRAINS:  Hemovac x1.  COMPLICATIONS:  None.  CONDITION:  Stable to recovery.  BRIEF CLINICAL NOTE:  Mr. Lance Hill is a 72 year old male who had a right total hip arthroplasty done in [place] couple years ago.  He has had significant lateral hip pain and his workup included a negative bone scan.  An MRI showed fluid laterally.  He has had extensive nonoperative management and it was felt that he either has an abductor tendon tear or he may have abnormal mechanics going onto the hip with excessive polyethylene wear.  He presents now for abductor mechanism repair and possible revision.  PROCEDURE IN DETAIL:  After successful administration of general anesthetic, the patient was placed in the left lateral decubitus position with the right side up and held with the hip positioner.  Right lower extremity was isolated from his perineum with plastic drapes, and prepped and draped in the usual sterile fashion.  Previous posterolateral incisions were utilized.  Skin cut with a 10-blade through the subcutaneous tissue to the level of the fascia lata, which was incised in line with the skin incision.  The tissue identified with some hemosiderin staining on the gluteus medius muscle.  There was also some staining over the fascia of the vastus lateralis.  Inspecting  the tendon shows that the central and posterior fibers of the gluteus medius was still attached, but the patient previously had an anterolateral approach to the hip and his whole anterolateral sleeve was avulsed off the femur.  It was felt that this was probably causing a large amount of his pain.  I decided to inspect the joint to make sure he did not have any metallosis or abnormal wear of his polyethylene.  The sciatic nerve was palpated and protected and the short external rotator was isolated off the femur and capsulotomy performed.  The hip was dislocated.  The femoral head was removed to gain better exposure of the acetabulum and to inspect the femoral component better.  It was a ceramic head for an Accolade stem.  It was 36- 5 ceramic head.  This was removed.  The femoral component was inspected.  It was in good position and was well fixed.  The femur was then retracted anteriorly to gain acetabular exposure.  Acetabular component was also in good position.  No evidence of any abnormal polyethylene wear or impingement.  We placed it through range of motion before we dislocated the hip and it was not impinging. I decided not  to change the acetabular component since everything looked fine.  We then placed a new femoral head, which was a 36+ 0 metal femoral head.  Hip was reduced with the same stability as before.  Wound was then copiously irrigated with saline solution.  The anterior soft tissue sleeve, which was including the abductor mechanism was then reattached to the femur using Mitek suture anchors.  The suture was then passed through the tendon and it was advanced back onto the bone and then oversewn through the bone.  There was a very stable repair there. The split in the gluteus medius was also repaired with interrupted #1 Ethibond.  I then repaired posteriorly the soft tissue structures back to the femur with Ethibond suture through drill holes.  Wound was then copiously  irrigated with saline solution.  Fascia lata was closed over Hemovac drain with a running #1 Stratafix suture.  Subcu was closed with Stratafix and interrupted 2-0 Vicryl and then subcuticular closed with running 4-0 Monocryl.  30 cc of 0.25% Marcaine with epi were injected into the subcu tissues before closure.  Once closed, the incision was cleaned and dried, and Steri-Strips and bulky sterile dressing applied. He was placed into a knee immobilizer, awakened and transported to recovery in stable condition.     Ollen Gross, M.D.     FA/MEDQ  D:  06/26/2017  T:  06/27/2017  Job:  [id number]

## 2017-06-27 NOTE — Evaluation (Signed)
Physical Therapy Evaluation Patient Details Name: Lance Hill MRN: [id number] DOB: 03/26/1946 Today's Date: 06/27/2017   History of Present Illness  Pt s/p R THR femoral head revision and abductor tendon repair  Clinical Impression  Pt admitted as above and presenting with decreased R LE strength/ROM, post op pain, posterior THP and no active abd R hip limiting functional mobility.  Pt should progress to dc home with assist of family.    Follow Up Recommendations Follow surgeon's recommendation for DC plan and follow-up therapies    Equipment Recommendations  None recommended by PT    Recommendations for Other Services       Precautions / Restrictions Precautions Precautions: Fall;Other (comment);Posterior Hip Precaution Booklet Issued: Yes (comment) Precaution Comments: No Active Abduction and extension.  Per PA, no therex program, just mobilize Restrictions Weight Bearing Restrictions: No Other Position/Activity Restrictions: WBAT      Mobility  Bed Mobility Overal bed mobility: Needs Assistance Bed Mobility: Supine to Sit     Supine to sit: Min assist     General bed mobility comments: cues for sequence and use of L LE to self assist  Transfers Overall transfer level: Needs assistance Equipment used: Rolling walker (2 wheeled) Transfers: Sit to/from Stand Sit to Stand: Min assist         General transfer comment: cues for LE management and use of UEs to self assist  Ambulation/Gait Ambulation/Gait assistance: Min assist;Min guard Ambulation Distance (Feet): 180 Feet Assistive device: Rolling walker (2 wheeled) Gait Pattern/deviations: Step-to pattern;Step-through pattern;Decreased step length - right;Decreased step length - left;Shuffle;Trunk flexed Gait velocity: decr Gait velocity interpretation: Below normal speed for age/gender General Gait Details: cues for sequence, posture, position from RW and THP on turns  Stairs            Wheelchair  Mobility    Modified Rankin (Stroke Patients Only)       Balance Overall balance assessment: No apparent balance deficits (not formally assessed)                                           Pertinent Vitals/Pain Pain Assessment: 0-10 Pain Score: 4  Pain Location: R hip Pain Descriptors / Indicators: Aching;Sore Pain Intervention(s): Limited activity within patient's tolerance;Monitored during session;Premedicated before session;Ice applied    Home Living Family/patient expects to be discharged to:: Private residence Living Arrangements: Spouse/significant other Available Help at Discharge: Family Type of Home: House Home Access: Stairs to enter Entrance Stairs-Rails: None Entrance Stairs-Number of Steps: 1 Home Layout: One level Home Equipment: Environmental consultantWalker - 2 wheels;Cane - single point;Bedside commode      Prior Function Level of Independence: Independent with assistive device(s);Independent         Comments: cane used in community with crowd and extra time for socks     Hand Dominance        Extremity/Trunk Assessment   Upper Extremity Assessment Upper Extremity Assessment: Overall WFL for tasks assessed    Lower Extremity Assessment Lower Extremity Assessment: RLE deficits/detail       Communication   Communication: No difficulties  Cognition Arousal/Alertness: Awake/alert Behavior During Therapy: WFL for tasks assessed/performed Overall Cognitive Status: Within Functional Limits for tasks assessed  General Comments      Exercises     Assessment/Plan    PT Assessment Patient needs continued PT services  PT Problem List Decreased strength;Decreased range of motion;Decreased activity tolerance;Decreased mobility;Decreased knowledge of use of DME;Pain;Obesity       PT Treatment Interventions DME instruction;Gait training;Stair training;Functional mobility training;Therapeutic  activities;Therapeutic exercise;Patient/family education    PT Goals (Current goals can be found in the Care Plan section)  Acute Rehab PT Goals Patient Stated Goal: Regain IND and walk without pain PT Goal Formulation: With patient Time For Goal Achievement: 07/04/17 Potential to Achieve Goals: Good    Frequency 7X/week   Barriers to discharge        Co-evaluation               AM-PAC PT "6 Clicks" Daily Activity  Outcome Measure Difficulty turning over in bed (including adjusting bedclothes, sheets and blankets)?: Unable Difficulty moving from lying on back to sitting on the side of the bed? : Unable Difficulty sitting down on and standing up from a chair with arms (e.g., wheelchair, bedside commode, etc,.)?: Unable Help needed moving to and from a bed to chair (including a wheelchair)?: A Little Help needed walking in hospital room?: A Little Help needed climbing 3-5 steps with a railing? : A Little 6 Click Score: 12    End of Session Equipment Utilized During Treatment: Gait belt Activity Tolerance: Patient tolerated treatment well Patient left: in chair;with call bell/phone within reach;with family/visitor present Nurse Communication: Mobility status PT Visit Diagnosis: Difficulty in walking, not elsewhere classified (R26.2)    Time: 4098-1191 PT Time Calculation (min) (ACUTE ONLY): 33 min   Charges:   PT Evaluation $PT Eval Low Complexity: 1 Low PT Treatments $Gait Training: 8-22 mins   PT G Codes:        Pg [phone]   Lance Hill 06/27/2017, 12:49 PM

## 2017-06-27 NOTE — Progress Notes (Addendum)
   Subjective: 1 Day Post-Op Procedure(s) (LRB): Right hip femoral head revision and abductor tendon repair (Right) Patient reports pain as mild.   Patient seen in rounds with Dr. Lequita HaltAluisio. Family in room Patient is well, but has had some minor complaints of pain in the hip, requiring pain medications We will start therapy today.  If they do well with therapy and meets all goals, then will allow home later this afternoon following therapy. Plan is to go Home after hospital stay.  Objective: Vital signs in last 24 hours: Temp:  [97.3 F (36.3 C)-98.6 F (37 C)] 98.5 F (36.9 C) (02/28 [id number]) Pulse Rate:  [75-93] 75 (02/28 0638) Resp:  [10-18] 18 (02/28 0638) BP: (104-144)/(57-78) 104/63 (02/28 0638) SpO2:  [97 %-100 %] 98 % (02/28 46960638) Weight:  [88.5 kg (195 lb)-88.5 kg (195 lb 1.7 oz)] 88.5 kg (195 lb 1.7 oz) (02/27 1844)  Intake/Output from previous day:  Intake/Output Summary (Last 24 hours) at 06/27/2017 0830 Last data filed at 06/27/2017 [id number] Gross per 24 hour  Intake 3705 ml  Output 1725 ml  Net 1980 ml    Intake/Output this shift: No intake/output data recorded.  Labs: Recent Labs    06/27/17 0503  HGB 10.5*   Recent Labs    06/27/17 0503  WBC 12.0*  RBC 3.33*  HCT 31.0*  PLT 169   Recent Labs    06/27/17 0503  NA 136  K 5.0  CL 106  CO2 24  BUN 30*  CREATININE 1.43*  GLUCOSE 311*  CALCIUM 8.4*   No results for input(s): LABPT, INR in the last 72 hours.  EXAM General - Patient is Alert, Appropriate and Oriented Extremity - Neurovascular intact Sensation intact distally Intact pulses distally Dressing - dressing C/D/I Motor Function - intact, moving foot and toes well on exam.  Hemovac pulled without difficulty.  Past Medical History:  Diagnosis Date  . Arthritis   . Diabetes mellitus without complication (HCC)    type II   . GERD (gastroesophageal reflux disease)   . History of kidney stones    hx of   . Hypertension      Assessment/Plan: 1 Day Post-Op Procedure(s) (LRB): Right hip femoral head revision and abductor tendon repair (Right) Principal Problem:   Failed total hip arthroplasty, subsequent encounter Active Problems:   Failed total hip arthroplasty, sequela  Estimated body mass index is 29.23 kg/m as calculated from the following:   Height as of this encounter: 5' 8.5" (1.74 m).   Weight as of this encounter: 88.5 kg (195 lb 1.7 oz). Advance diet Up with therapy  DVT Prophylaxis - Aspirin 325 mg Weight Bearing As Tolerated right Leg Hemovac Pulled Begin Therapy  If meets goals and able to go home: Advance diet  DC Home Diet - Cardiac diet and Diabetic diet Follow up - in 2 weeks Activity - WBAT Disposition - Home Condition Upon Discharge - pending therapy D/C Meds - See DC Summary DVT Prophylaxis - Aspirin 325 mg  Posterior Hip Precautions No Active Abduction of Hip No Active Extension of Hip Only Walking - No HHPT  Avel Peacerew Perkins, PA-C Orthopaedic Surgery 06/27/2017, 8:30 AM

## 2017-06-27 NOTE — Progress Notes (Signed)
Discharge planning, no HH needs identified. Plan for no PT, has DME. [phone] 

## 2017-07-30 DIAGNOSIS — Z471 Aftercare following joint replacement surgery: Secondary | ICD-10-CM | POA: Diagnosis not present

## 2017-07-30 DIAGNOSIS — Z96641 Presence of right artificial hip joint: Secondary | ICD-10-CM | POA: Diagnosis not present

## 2017-08-04 ENCOUNTER — Emergency Department (HOSPITAL_COMMUNITY)
Admission: EM | Admit: 2017-08-04 | Discharge: 2017-08-04 | Disposition: A | Discharge status: Home / Self Care | Payer: Medicare Other | Attending: Emergency Medicine | Admitting: Emergency Medicine

## 2017-08-04 ENCOUNTER — Emergency Department (HOSPITAL_COMMUNITY): Payer: Medicare Other

## 2017-08-04 DIAGNOSIS — Z7982 Long term (current) use of aspirin: Secondary | ICD-10-CM | POA: Diagnosis not present

## 2017-08-04 DIAGNOSIS — E119 Type 2 diabetes mellitus without complications: Secondary | ICD-10-CM | POA: Insufficient documentation

## 2017-08-04 DIAGNOSIS — I10 Essential (primary) hypertension: Secondary | ICD-10-CM | POA: Diagnosis not present

## 2017-08-04 DIAGNOSIS — Z96641 Presence of right artificial hip joint: Secondary | ICD-10-CM | POA: Insufficient documentation

## 2017-08-04 DIAGNOSIS — Y999 Unspecified external cause status: Secondary | ICD-10-CM | POA: Diagnosis not present

## 2017-08-04 DIAGNOSIS — Y33XXXA Other specified events, undetermined intent, initial encounter: Secondary | ICD-10-CM | POA: Diagnosis not present

## 2017-08-04 DIAGNOSIS — Z79899 Other long term (current) drug therapy: Secondary | ICD-10-CM | POA: Diagnosis not present

## 2017-08-04 DIAGNOSIS — S73004A Unspecified dislocation of right hip, initial encounter: Secondary | ICD-10-CM | POA: Diagnosis not present

## 2017-08-04 DIAGNOSIS — Z96659 Presence of unspecified artificial knee joint: Secondary | ICD-10-CM | POA: Diagnosis not present

## 2017-08-04 DIAGNOSIS — Z7984 Long term (current) use of oral hypoglycemic drugs: Secondary | ICD-10-CM | POA: Diagnosis not present

## 2017-08-04 DIAGNOSIS — S79911A Unspecified injury of right hip, initial encounter: Secondary | ICD-10-CM | POA: Diagnosis not present

## 2017-08-04 DIAGNOSIS — M79604 Pain in right leg: Secondary | ICD-10-CM | POA: Diagnosis not present

## 2017-08-04 DIAGNOSIS — Y929 Unspecified place or not applicable: Secondary | ICD-10-CM | POA: Diagnosis not present

## 2017-08-04 DIAGNOSIS — G8911 Acute pain due to trauma: Secondary | ICD-10-CM | POA: Diagnosis not present

## 2017-08-04 DIAGNOSIS — S73034A Other anterior dislocation of right hip, initial encounter: Secondary | ICD-10-CM | POA: Diagnosis not present

## 2017-08-04 DIAGNOSIS — Y9389 Activity, other specified: Secondary | ICD-10-CM | POA: Insufficient documentation

## 2017-08-04 LAB — CBC WITH DIFFERENTIAL/PLATELET
Basophils Absolute: 0 10*3/uL (ref 0.0–0.1)
Basophils Relative: 0 %
EOS ABS: 0.1 10*3/uL (ref 0.0–0.7)
Eosinophils Relative: 1 %
HCT: 34.2 % — ABNORMAL LOW (ref 39.0–52.0)
HEMOGLOBIN: 11.1 g/dL — AB (ref 13.0–17.0)
LYMPHS ABS: 1 10*3/uL (ref 0.7–4.0)
LYMPHS PCT: 11 %
MCH: 30.1 pg (ref 26.0–34.0)
MCHC: 32.5 g/dL (ref 30.0–36.0)
MCV: 92.7 fL (ref 78.0–100.0)
Monocytes Absolute: 0.5 10*3/uL (ref 0.1–1.0)
Monocytes Relative: 6 %
NEUTROS PCT: 82 %
Neutro Abs: 7.1 10*3/uL (ref 1.7–7.7)
Platelets: 169 10*3/uL (ref 150–400)
RBC: 3.69 MIL/uL — ABNORMAL LOW (ref 4.22–5.81)
RDW: 13.8 % (ref 11.5–15.5)
WBC: 8.6 10*3/uL (ref 4.0–10.5)

## 2017-08-04 LAB — I-STAT CHEM 8, ED
BUN: 23 mg/dL — AB (ref 6–20)
CHLORIDE: 102 mmol/L (ref 101–111)
CREATININE: 1.2 mg/dL (ref 0.61–1.24)
Calcium, Ion: 1.15 mmol/L (ref 1.15–1.40)
GLUCOSE: 309 mg/dL — AB (ref 65–99)
HEMATOCRIT: 33 % — AB (ref 39.0–52.0)
Hemoglobin: 11.2 g/dL — ABNORMAL LOW (ref 13.0–17.0)
POTASSIUM: 3.9 mmol/L (ref 3.5–5.1)
Sodium: 137 mmol/L (ref 135–145)
TCO2: 21 mmol/L — ABNORMAL LOW (ref 22–32)

## 2017-08-04 LAB — PROTIME-INR
INR: 1.16
PROTHROMBIN TIME: 14.7 s (ref 11.4–15.2)

## 2017-08-04 MED ORDER — FENTANYL CITRATE (PF) 100 MCG/2ML IJ SOLN
50.0000 ug | INTRAMUSCULAR | Status: DC | PRN
  Administered 2017-08-04: 50 ug via INTRAVENOUS
  Filled 2017-08-04: qty 2

## 2017-08-04 MED ORDER — SODIUM CHLORIDE 0.9 % IV BOLUS
500.0000 mL | Freq: Once | INTRAVENOUS | Status: AC
  Administered 2017-08-04: 500 mL via INTRAVENOUS

## 2017-08-04 MED ORDER — PROPOFOL 10 MG/ML IV BOLUS
0.5000 mg/kg | Freq: Once | INTRAVENOUS | Status: AC
  Administered 2017-08-04: 140 mg via INTRAVENOUS
  Filled 2017-08-04: qty 20

## 2017-08-04 MED ORDER — ONDANSETRON HCL 4 MG/2ML IJ SOLN
4.0000 mg | Freq: Once | INTRAMUSCULAR | Status: AC
  Administered 2017-08-04: 4 mg via INTRAVENOUS
  Filled 2017-08-04: qty 2

## 2017-08-04 NOTE — Discharge Instructions (Signed)
Use the pain medication you have at home as needed for the pain.  Wear your knee immobilizer at all times except when showering.

## 2017-08-04 NOTE — ED Provider Notes (Signed)
[facility]-EMERGENCY DEPT Provider Note   CSN: [id number] Arrival date & time: 08/04/17  1105     History   Chief Complaint Chief Complaint  Patient presents with  . Hip Pain (right)    HPI Lance Hill is a 72 y.o. male.  Patient is a 72 year old male with a history of diabetes, hypertension, recent hip replacement in February who is presenting today with severe pain in his right hip.  He saw his doctor this week and had x-rays which looked good.  He was cleared to resume normal activity.  He felt good yesterday when he got out of bed this morning.  He was in the shower and he bent over to dry himself off and he moved his leg a certain way which produced severe pain that brought him to the ground and he has been unable to move his right leg or walk since that time.  Patient was given some pain medication by EMS in route with only minimal improvement.  He has sensation down in his feet but states something just feels wrong.  He did not lose consciousness or hit his head.  The history is provided by the patient.    Past Medical History:  Diagnosis Date  . Arthritis   . Diabetes mellitus without complication (HCC)    type II   . GERD (gastroesophageal reflux disease)   . History of kidney stones    hx of   . Hypertension     Patient Active Problem List   Diagnosis Date Noted  . Failed total hip arthroplasty, subsequent encounter 06/26/2017  . Failed total hip arthroplasty, sequela 06/26/2017  . Spinal stenosis of lumbar region 02/06/2017  . Spinal stenosis, lumbar 02/06/2017    Past Surgical History:  Procedure Laterality Date  . BACK SURGERY    . CHOLECYSTECTOMY    . EYE SURGERY     right eye surgery , bilateral cataract   . JOINT REPLACEMENT     hip and knee replacements   . LUMBAR LAMINECTOMY/DECOMPRESSION MICRODISCECTOMY Right 02/06/2017   Procedure: Microlumbar decompression L5-S1 right;  Surgeon: Jene Every, MD;  Location: [facility] ORS;   Service: Orthopedics;  Laterality: Right;  90 mins  . TONSILLECTOMY    . TOTAL HIP REVISION Right 06/26/2017   Procedure: Right hip femoral head revision and abductor tendon repair;  Surgeon: Ollen Gross, MD;  Location: [facility];  Service: Orthopedics;  Laterality: Right;        Home Medications    Prior to Admission medications   Medication Sig Start Date End Date Taking? Authorizing Provider  acetaminophen (TYLENOL) 325 MG tablet Take 650 mg by mouth every 6 (six) hours as needed for moderate pain.    [provider]  amLODipine (NORVASC) 10 MG tablet Take 10 mg by mouth at bedtime.     [provider]  ARTIFICIAL TEAR OP Place 1 drop into both eyes daily as needed (for dry eyes).    [provider]  aspirin EC 325 MG EC tablet Take 1 tablet (325 mg total) by mouth daily with breakfast. Take a full dose enteric coated 325 mg Aspirin daily for three weeks.   Once the patient has completed the three weeks of full dose Aspirin, then they may reduce back to the 81 mg Aspirin daily at home. 06/28/17   Perkins, Alexzandrew L, PA-C  Brinzolamide-Brimonidine 1-0.2 % SUSP Place 1 drop into both eyes 3 (three) times daily.    [provider]  Cholecalciferol ([employer] VITAMIN D3) 1000 units capsule Take 2,000 Units by mouth daily.    [provider]  docusate sodium (COLACE) 100 MG capsule Take 1 capsule (100 mg total) by mouth 2 (two) times daily as needed for mild constipation. Patient not taking: Reported on 06/12/2017 02/06/17   Jene Every, MD  gabapentin (NEURONTIN) 300 MG capsule Take 300 mg by mouth 2 (two) times daily.    [provider]  glyBURIDE (DIABETA) 5 MG tablet Take 5 mg by mouth 2 (two) times daily with a meal.    [provider]  hydrochlorothiazide (HYDRODIURIL) 25 MG tablet Take 12.5 mg by mouth daily.    [provider]  HYDROcodone-acetaminophen (NORCO/VICODIN) 5-325 MG tablet Take 1-2 tablets by mouth every 4  (four) hours as needed for moderate pain (pain score 4-6). 06/27/17   Perkins, Alexzandrew L, PA-C  loratadine (CLARITIN) 10 MG tablet Take 10 mg by mouth daily.    [provider]  losartan (COZAAR) 100 MG tablet Take 100 mg by mouth daily.    [provider]  magnesium oxide (MAG-OX) 400 MG tablet Take 400 mg by mouth daily.    [provider]  metFORMIN (GLUCOPHAGE) 500 MG tablet Take 500 mg by mouth 2 (two) times daily with a meal.    [provider]  methocarbamol (ROBAXIN) 500 MG tablet Take 1 tablet (500 mg total) by mouth every 6 (six) hours as needed for muscle spasms. 06/27/17   Perkins, Alexzandrew L, PA-C  pioglitazone (ACTOS) 45 MG tablet Take 45 mg by mouth daily.    [provider]  polyethylene glycol (MIRALAX / GLYCOLAX) packet Take 17 g by mouth daily. Patient not taking: Reported on 06/12/2017 02/06/17   Jene Every, MD  ranitidine (ZANTAC) 300 MG tablet Take 150 mg by mouth 2 (two) times daily.     [provider]  simvastatin (ZOCOR) 20 MG tablet Take 20 mg by mouth at bedtime.     [provider]  traMADol (ULTRAM) 50 MG tablet Take 1-2 tablets (50-100 mg total) by mouth every 6 (six) hours as needed (mild pain). 06/27/17   Perkins, Alexzandrew L, PA-C    Family History No family history on file.  Social History Social History   Tobacco Use  . Smoking status: Never Smoker  . Smokeless tobacco: Never Used  Substance Use Topics  . Alcohol use: Yes    Comment: glass wine once a month  . Drug use: No     Allergies   Patient has no known allergies.   Review of Systems Review of Systems  All other systems reviewed and are negative.    Physical Exam Updated Vital Signs BP (!) 144/74   Pulse 91   Temp 97.6 F (36.4 C) (Oral)   Resp 16   SpO2 100%   Physical Exam  Constitutional: He is oriented to person, place, and time. He appears well-developed and well-nourished. He appears distressed.    Appears to be in pain  HENT:  Head: Normocephalic and atraumatic.  Mouth/Throat: Oropharynx is clear and moist.  Eyes: Pupils are equal, round, and reactive to light. Conjunctivae and EOM are normal.  Neck: Normal range of motion. Neck supple.  Cardiovascular: Normal rate, regular rhythm and intact distal pulses.  No murmur heard. Pulmonary/Chest: Effort normal and breath sounds normal. No respiratory distress. He has no wheezes. He has no rales.  Abdominal: Soft. He exhibits no distension. There is no tenderness. There is no rebound and  no guarding.  Musculoskeletal: Normal range of motion. He exhibits no edema or tenderness.  Well-healed surgical incision over the right hip.  Right leg is shortened and slightly rotated.  Pulses are present bilaterally.  Sensation is intact.  Neurological: He is alert and oriented to person, place, and time.  Skin: Skin is warm and dry. No rash noted. No erythema.  Psychiatric: He has a normal mood and affect. His behavior is normal.  Nursing note and vitals reviewed.    ED Treatments / Results  Labs (all labs ordered are listed, but only abnormal results are displayed) Labs Reviewed  CBC WITH DIFFERENTIAL/PLATELET - Abnormal; Notable for the following components:      Result Value   RBC 3.69 (*)    Hemoglobin 11.1 (*)    HCT 34.2 (*)    All other components within normal limits  I-STAT CHEM 8, ED - Abnormal; Notable for the following components:   BUN 23 (*)    Glucose, Bld 309 (*)    TCO2 21 (*)    Hemoglobin 11.2 (*)    HCT 33.0 (*)    All other components within normal limits  PROTIME-INR    EKG None  Radiology Dg Hip Port Unilat W Or Wo Pelvis 1 View Right  Result Date: 08/04/2017 CLINICAL DATA:  Status post reduction of right hip. EXAM: DG HIP (WITH OR WITHOUT PELVIS) 1V PORT RIGHT COMPARISON:  None. FINDINGS: The previously identified right hip dislocation has been reduced on this single frontal view. IMPRESSION: Reduction of hip  dislocation. Electronically Signed   By: Gerome Sam III M.D   On: 08/04/2017 13:34   Dg Hip Unilat With Pelvis 2-3 Views Right  Result Date: 08/04/2017 CLINICAL DATA:  Right hip pain. EXAM: DG HIP (WITH OR WITHOUT PELVIS) 2-3V RIGHT COMPARISON:  None. FINDINGS: The patient is status post right hip replacement. There is dislocation of the femoral component superior to the acetabular component. No fractures are seen. IMPRESSION: Dislocation of the right hip replacement. Electronically Signed   By: Gerome Sam III M.D   On: 08/04/2017 11:58    Procedures .Sedation Date/Time: 08/04/2017 1:11 PM Performed by: Gwyneth Sprout, MD Authorized by: Gwyneth Sprout, MD   Consent:    Consent obtained:  Verbal   Consent given by:  Patient   Risks discussed:  Allergic reaction, dysrhythmia, inadequate sedation, nausea, prolonged hypoxia resulting in organ damage, prolonged sedation necessitating reversal, respiratory compromise necessitating ventilatory assistance and intubation and vomiting   Alternatives discussed:  Analgesia without sedation, anxiolysis and regional anesthesia Universal protocol:    Procedure explained and questions answered to patient or proxy's satisfaction: yes     Relevant documents present and verified: yes     Test results available and properly labeled: yes     Imaging studies available: yes     Required blood products, implants, devices, and special equipment available: yes     Site/side marked: yes     Immediately prior to procedure a time out was called: yes     Patient identity confirmation method:  Verbally with patient and hospital-assigned identification number Indications:    Procedure performed:  Dislocation reduction   Procedure necessitating sedation performed by:  Physician performing sedation   Intended level of sedation:  Deep Pre-sedation assessment:    Time since last food or drink:  0800   ASA classification: class 2 - patient with mild  systemic disease     Neck mobility: normal     Mouth  opening:  3 or more finger widths   Thyromental distance:  4 finger widths   Mallampati score:  I - soft palate, uvula, fauces, pillars visible   Pre-sedation assessments completed and reviewed: airway patency, cardiovascular function, hydration status, mental status, nausea/vomiting, pain level, respiratory function and temperature     Pre-sedation assessment completed:  08/04/2017 12:11 PM Immediate pre-procedure details:    Reassessment: Patient reassessed immediately prior to procedure     Reviewed: vital signs, relevant labs/tests and NPO status     Verified: bag valve mask available, emergency equipment available, intubation equipment available, IV patency confirmed, oxygen available and suction available   Procedure details (see MAR for exact dosages):    Preoxygenation:  Nasal cannula   Sedation:  Propofol   Analgesia:  Fentanyl   Intra-procedure monitoring:  Blood pressure monitoring, cardiac monitor, continuous pulse oximetry, frequent LOC assessments, frequent vital sign checks and continuous capnometry   Intra-procedure events: none     Intra-procedure management:  Airway repositioning   Total Provider sedation time (minutes):  20 Post-procedure details:    Post-sedation assessment completed:  08/04/2017 1:12 PM   Attendance: Constant attendance by certified staff until patient recovered     Recovery: Patient returned to pre-procedure baseline     Post-sedation assessments completed and reviewed: airway patency, cardiovascular function, hydration status, mental status, nausea/vomiting, pain level, respiratory function and temperature     Patient is stable for discharge or admission: yes     Patient tolerance:  Tolerated well, no immediate complications Reduction of dislocation Date/Time: 08/04/2017 1:13 PM Performed by: Gwyneth Sprout, MD Authorized by: Gwyneth Sprout, MD  Consent: Verbal consent obtained. Written consent  obtained. Consent given by: patient Patient understanding: patient states understanding of the procedure being performed Patient consent: the patient's understanding of the procedure matches consent given Relevant documents: relevant documents present and verified Imaging studies: imaging studies available Patient identity confirmed: verbally with patient and arm band Time out: Immediately prior to procedure a "time out" was called to verify the correct patient, procedure, equipment, support staff and site/side marked as required. Preparation: Patient was prepped and draped in the usual sterile fashion. Local anesthesia used: no  Anesthesia: Local anesthesia used: no  Sedation: Patient sedated: yes Sedatives: propofol Analgesia: morphine Vitals: Vital signs were monitored during sedation.  Patient tolerance: Patient tolerated the procedure well with no immediate complications Comments: Reduction of right hip with flexion and traction without difficulty    (including critical care time)  Medications Ordered in ED Medications  fentaNYL (SUBLIMAZE) injection 50 mcg (has no administration in time range)  ondansetron (ZOFRAN) injection 4 mg (has no administration in time range)     Initial Impression / Assessment and Plan / ED Course  I have reviewed the triage vital signs and the nursing notes.  Pertinent labs & imaging results that were available during my care of the patient were reviewed by me and considered in my medical decision making (see chart for details).     Patient presenting due to severe right hip pain after bending in the shower and moving his foot a certain way.  He has been unable to walk or move his legs since that time.  He is currently neurovascularly intact but has shortening of his leg and concern for possible new hip dislocation in the setting of recent hip replacement.  X-ray pending.  Patient given pain control  12:12 PM Hip is positive for  dislocation  1:14 PM Sedation and reduction as above.  No complications.  Patient placed in a knee immobilizer.  Repeat image shows reduced dislocation.  Patient will follow up with Dr. Berton LanAlLusio  Final Clinical Impressions(s) / ED Diagnoses   Final diagnoses:  Anterior dislocation of right hip, initial encounter [facility](HCC)    ED Discharge Orders    None       Gwyneth SproutPlunkett, Ruble Buttler, MD 08/04/17 1405

## 2017-08-04 NOTE — ED Triage Notes (Signed)
Per EMS, pt is coming from home with complains of right hip pain after taking a shower. Recent hip replacement on 3/27. Pt recently saw surgeon and everything was normal up until today. Pt was able to walk with a cane before this morning and now he can not ambulate at all. Pt is AO x4.

## 2017-08-04 NOTE — ED Notes (Signed)
Bed: WA09 Expected date:  Expected time:  Means of arrival:  Comments: 72 yo hip pain-recent replacement

## 2017-09-17 DIAGNOSIS — Z471 Aftercare following joint replacement surgery: Secondary | ICD-10-CM | POA: Diagnosis not present

## 2017-09-17 DIAGNOSIS — M7601 Gluteal tendinitis, right hip: Secondary | ICD-10-CM | POA: Diagnosis not present

## 2017-09-17 DIAGNOSIS — M7061 Trochanteric bursitis, right hip: Secondary | ICD-10-CM | POA: Diagnosis not present

## 2017-09-17 DIAGNOSIS — Z96641 Presence of right artificial hip joint: Secondary | ICD-10-CM | POA: Diagnosis not present

## 2017-09-24 DIAGNOSIS — M7061 Trochanteric bursitis, right hip: Secondary | ICD-10-CM | POA: Diagnosis not present

## 2017-09-24 DIAGNOSIS — R2689 Other abnormalities of gait and mobility: Secondary | ICD-10-CM | POA: Diagnosis not present

## 2017-09-24 DIAGNOSIS — M25551 Pain in right hip: Secondary | ICD-10-CM | POA: Diagnosis not present

## 2017-09-27 DIAGNOSIS — M7061 Trochanteric bursitis, right hip: Secondary | ICD-10-CM | POA: Diagnosis not present

## 2017-09-27 DIAGNOSIS — M25551 Pain in right hip: Secondary | ICD-10-CM | POA: Diagnosis not present

## 2017-09-27 DIAGNOSIS — R2689 Other abnormalities of gait and mobility: Secondary | ICD-10-CM | POA: Diagnosis not present

## 2017-10-01 DIAGNOSIS — M25551 Pain in right hip: Secondary | ICD-10-CM | POA: Diagnosis not present

## 2017-10-01 DIAGNOSIS — R2689 Other abnormalities of gait and mobility: Secondary | ICD-10-CM | POA: Diagnosis not present

## 2017-10-01 DIAGNOSIS — M7061 Trochanteric bursitis, right hip: Secondary | ICD-10-CM | POA: Diagnosis not present

## 2017-10-03 DIAGNOSIS — M25551 Pain in right hip: Secondary | ICD-10-CM | POA: Diagnosis not present

## 2017-10-03 DIAGNOSIS — R2689 Other abnormalities of gait and mobility: Secondary | ICD-10-CM | POA: Diagnosis not present

## 2017-10-03 DIAGNOSIS — M7061 Trochanteric bursitis, right hip: Secondary | ICD-10-CM | POA: Diagnosis not present

## 2017-10-10 DIAGNOSIS — M1611 Unilateral primary osteoarthritis, right hip: Secondary | ICD-10-CM | POA: Diagnosis not present

## 2017-11-06 DIAGNOSIS — R2689 Other abnormalities of gait and mobility: Secondary | ICD-10-CM | POA: Diagnosis not present

## 2017-11-06 DIAGNOSIS — M7061 Trochanteric bursitis, right hip: Secondary | ICD-10-CM | POA: Diagnosis not present

## 2017-11-06 DIAGNOSIS — M25551 Pain in right hip: Secondary | ICD-10-CM | POA: Diagnosis not present

## 2017-11-08 DIAGNOSIS — M25551 Pain in right hip: Secondary | ICD-10-CM | POA: Diagnosis not present

## 2017-11-08 DIAGNOSIS — M7061 Trochanteric bursitis, right hip: Secondary | ICD-10-CM | POA: Diagnosis not present

## 2017-11-08 DIAGNOSIS — R2689 Other abnormalities of gait and mobility: Secondary | ICD-10-CM | POA: Diagnosis not present

## 2017-11-11 DIAGNOSIS — M7061 Trochanteric bursitis, right hip: Secondary | ICD-10-CM | POA: Diagnosis not present

## 2017-11-11 DIAGNOSIS — M25551 Pain in right hip: Secondary | ICD-10-CM | POA: Diagnosis not present

## 2017-11-11 DIAGNOSIS — R2689 Other abnormalities of gait and mobility: Secondary | ICD-10-CM | POA: Diagnosis not present

## 2017-11-15 DIAGNOSIS — R2689 Other abnormalities of gait and mobility: Secondary | ICD-10-CM | POA: Diagnosis not present

## 2017-11-15 DIAGNOSIS — M25551 Pain in right hip: Secondary | ICD-10-CM | POA: Diagnosis not present

## 2017-11-15 DIAGNOSIS — M7061 Trochanteric bursitis, right hip: Secondary | ICD-10-CM | POA: Diagnosis not present

## 2017-11-18 DIAGNOSIS — M25551 Pain in right hip: Secondary | ICD-10-CM | POA: Diagnosis not present

## 2017-11-18 DIAGNOSIS — M7061 Trochanteric bursitis, right hip: Secondary | ICD-10-CM | POA: Diagnosis not present

## 2017-11-18 DIAGNOSIS — R2689 Other abnormalities of gait and mobility: Secondary | ICD-10-CM | POA: Diagnosis not present

## 2017-11-20 DIAGNOSIS — M25551 Pain in right hip: Secondary | ICD-10-CM | POA: Diagnosis not present

## 2017-11-20 DIAGNOSIS — R2689 Other abnormalities of gait and mobility: Secondary | ICD-10-CM | POA: Diagnosis not present

## 2017-11-20 DIAGNOSIS — M7061 Trochanteric bursitis, right hip: Secondary | ICD-10-CM | POA: Diagnosis not present

## 2017-11-25 DIAGNOSIS — M7061 Trochanteric bursitis, right hip: Secondary | ICD-10-CM | POA: Diagnosis not present

## 2017-11-25 DIAGNOSIS — R2689 Other abnormalities of gait and mobility: Secondary | ICD-10-CM | POA: Diagnosis not present

## 2017-11-25 DIAGNOSIS — M25551 Pain in right hip: Secondary | ICD-10-CM | POA: Diagnosis not present

## 2017-11-27 DIAGNOSIS — M25551 Pain in right hip: Secondary | ICD-10-CM | POA: Diagnosis not present

## 2017-11-27 DIAGNOSIS — M7061 Trochanteric bursitis, right hip: Secondary | ICD-10-CM | POA: Diagnosis not present

## 2017-11-27 DIAGNOSIS — R2689 Other abnormalities of gait and mobility: Secondary | ICD-10-CM | POA: Diagnosis not present

## 2017-12-10 ENCOUNTER — Emergency Department (HOSPITAL_COMMUNITY)
Admission: EM | Admit: 2017-12-10 | Discharge: 2017-12-10 | Disposition: A | Discharge status: Home / Self Care | Payer: Medicare Other | Attending: Emergency Medicine | Admitting: Emergency Medicine

## 2017-12-10 ENCOUNTER — Emergency Department (HOSPITAL_COMMUNITY): Payer: Medicare Other

## 2017-12-10 DIAGNOSIS — R2689 Other abnormalities of gait and mobility: Secondary | ICD-10-CM | POA: Diagnosis not present

## 2017-12-10 DIAGNOSIS — Y999 Unspecified external cause status: Secondary | ICD-10-CM | POA: Diagnosis not present

## 2017-12-10 DIAGNOSIS — Z7982 Long term (current) use of aspirin: Secondary | ICD-10-CM | POA: Diagnosis not present

## 2017-12-10 DIAGNOSIS — R52 Pain, unspecified: Secondary | ICD-10-CM | POA: Diagnosis not present

## 2017-12-10 DIAGNOSIS — Z79899 Other long term (current) drug therapy: Secondary | ICD-10-CM | POA: Diagnosis not present

## 2017-12-10 DIAGNOSIS — E119 Type 2 diabetes mellitus without complications: Secondary | ICD-10-CM | POA: Diagnosis not present

## 2017-12-10 DIAGNOSIS — Z471 Aftercare following joint replacement surgery: Secondary | ICD-10-CM | POA: Diagnosis not present

## 2017-12-10 DIAGNOSIS — Y9289 Other specified places as the place of occurrence of the external cause: Secondary | ICD-10-CM | POA: Diagnosis not present

## 2017-12-10 DIAGNOSIS — Y93B1 Activity, exercise machines primarily for muscle strengthening: Secondary | ICD-10-CM | POA: Insufficient documentation

## 2017-12-10 DIAGNOSIS — Z7984 Long term (current) use of oral hypoglycemic drugs: Secondary | ICD-10-CM | POA: Insufficient documentation

## 2017-12-10 DIAGNOSIS — X509XXA Other and unspecified overexertion or strenuous movements or postures, initial encounter: Secondary | ICD-10-CM | POA: Diagnosis not present

## 2017-12-10 DIAGNOSIS — S73004A Unspecified dislocation of right hip, initial encounter: Secondary | ICD-10-CM | POA: Insufficient documentation

## 2017-12-10 DIAGNOSIS — M7061 Trochanteric bursitis, right hip: Secondary | ICD-10-CM | POA: Diagnosis not present

## 2017-12-10 DIAGNOSIS — I10 Essential (primary) hypertension: Secondary | ICD-10-CM | POA: Insufficient documentation

## 2017-12-10 DIAGNOSIS — T84020A Dislocation of internal right hip prosthesis, initial encounter: Secondary | ICD-10-CM | POA: Diagnosis not present

## 2017-12-10 DIAGNOSIS — Z96641 Presence of right artificial hip joint: Secondary | ICD-10-CM | POA: Diagnosis not present

## 2017-12-10 DIAGNOSIS — M25551 Pain in right hip: Secondary | ICD-10-CM | POA: Diagnosis not present

## 2017-12-10 MED ORDER — PROPOFOL 10 MG/ML IV BOLUS
INTRAVENOUS | Status: AC | PRN
  Administered 2017-12-10: 40 mg via INTRAVENOUS

## 2017-12-10 MED ORDER — MORPHINE SULFATE (PF) 4 MG/ML IV SOLN
4.0000 mg | Freq: Once | INTRAVENOUS | Status: DC
  Filled 2017-12-10: qty 1

## 2017-12-10 MED ORDER — MORPHINE SULFATE (PF) 4 MG/ML IV SOLN
4.0000 mg | Freq: Once | INTRAVENOUS | Status: AC
  Administered 2017-12-10: 4 mg via INTRAVENOUS
  Filled 2017-12-10: qty 1

## 2017-12-10 MED ORDER — PROPOFOL 10 MG/ML IV BOLUS
0.5000 mg/kg | Freq: Once | INTRAVENOUS | Status: AC
  Administered 2017-12-10: 50 mg via INTRAVENOUS
  Filled 2017-12-10: qty 20

## 2017-12-10 MED ORDER — ONDANSETRON HCL 4 MG/2ML IJ SOLN
4.0000 mg | Freq: Once | INTRAMUSCULAR | Status: AC
  Administered 2017-12-10: 4 mg via INTRAVENOUS
  Filled 2017-12-10: qty 2

## 2017-12-10 NOTE — ED Notes (Signed)
ED Provider at bedside. 

## 2017-12-10 NOTE — Sedation Documentation (Signed)
Non-re breather was discontinued by Aundra MilletMegan, RT. Patient placed on [place] at 4L. Patient is alert and talking.

## 2017-12-10 NOTE — Sedation Documentation (Signed)
Discontinued additional oxygen. Patient is on room air.

## 2017-12-10 NOTE — Sedation Documentation (Signed)
Right hip was placed back into joint by Chrissie NoaWilliam, [place].

## 2017-12-10 NOTE — Sedation Documentation (Signed)
Patient's oxygen level via [place] at 2L is intermittently decreasing to the mid 80's (85%-83%). Megan, RTincreased oxygen flow to 6L via [place].

## 2017-12-10 NOTE — ED Triage Notes (Signed)
Transported by [facility] from Physical Therapy; recently had right hip replacement a few months ago. Was completing leg press and dislocated right hip. +deformity and shortening. Pain 6/10, refused pain medication en route with EMS.

## 2017-12-10 NOTE — ED Provider Notes (Signed)
[facility]-EMERGENCY DEPT Provider Note   CSN: [id number] Arrival date & time: 12/10/17  1631     History   Chief Complaint Chief Complaint  Patient presents with  . Hip Pain    HPI Lance Hill is a 72 y.o. male.  Lance Hill is a 72 y.o. Male who has a history of right hip replacement who presents to the emergency department complaining of right hip pain today.  Patient reports he was at physical therapy doing a leg press when he believes the left hip came out of joint.  EMS reports rotation and shortening of his right leg.  Patient reports pain to his right hip and pain with movement.  He has had a hip replacement back in 2016 by Dr. Lequita HaltAluisio.  He also reports he had some repair of his ligaments this past February.  No treatments prior to arrival today.  He reports he last had a smoothie around 9 AM this morning.  Nothing since.  He denies numbness or tingling. No other injury. No chest pain or shortness of breath.   The history is provided by the patient and medical records. No language interpreter was used.  Hip Pain  Pertinent negatives include no chest pain, no abdominal pain, no headaches and no shortness of breath.    Past Medical History:  Diagnosis Date  . Arthritis   . Diabetes mellitus without complication (HCC)    type II   . GERD (gastroesophageal reflux disease)   . History of kidney stones    hx of   . Hypertension     Patient Active Problem List   Diagnosis Date Noted  . Failed total hip arthroplasty, subsequent encounter 06/26/2017  . Failed total hip arthroplasty, sequela 06/26/2017  . Spinal stenosis of lumbar region 02/06/2017  . Spinal stenosis, lumbar 02/06/2017    Past Surgical History:  Procedure Laterality Date  . BACK SURGERY    . CHOLECYSTECTOMY    . EYE SURGERY     right eye surgery , bilateral cataract   . JOINT REPLACEMENT     hip and knee replacements   . LUMBAR LAMINECTOMY/DECOMPRESSION MICRODISCECTOMY Right  02/06/2017   Procedure: Microlumbar decompression L5-S1 right;  Surgeon: Jene EveryBeane, Jeffrey, MD;  Location: [facility] ORS;  Service: Orthopedics;  Laterality: Right;  90 mins  . TONSILLECTOMY    . TOTAL HIP REVISION Right 06/26/2017   Procedure: Right hip femoral head revision and abductor tendon repair;  Surgeon: Ollen GrossAluisio, Frank, MD;  Location: [facility];  Service: Orthopedics;  Laterality: Right;        Home Medications    Prior to Admission medications   Medication Sig Start Date End Date Taking? Authorizing Provider  acetaminophen (TYLENOL) 325 MG tablet Take 650 mg by mouth every 6 (six) hours as needed for moderate pain.    [provider]  amLODipine (NORVASC) 10 MG tablet Take 10 mg by mouth at bedtime.     [provider]  ARTIFICIAL TEAR OP Place 1 drop into both eyes daily as needed (for dry eyes).    [provider]  aspirin EC 325 MG EC tablet Take 1 tablet (325 mg total) by mouth daily with breakfast. Take a full dose enteric coated 325 mg Aspirin daily for three weeks.   Once the patient has completed the three weeks of full dose Aspirin, then they may reduce back to the 81 mg Aspirin daily at home. Patient not taking: Reported on 08/04/2017 06/28/17   Patrica DuelPerkins, Alexzandrew  L, PA-C  aspirin EC 81 MG tablet Take 81 mg by mouth at bedtime.    [provider]  Brinzolamide-Brimonidine 1-0.2 % SUSP Place 1 drop into both eyes 3 (three) times daily.    [provider]  Cholecalciferol ([employer] VITAMIN D3) 1000 units capsule Take 2,000 Units by mouth daily.    [provider]  docusate sodium (COLACE) 100 MG capsule Take 1 capsule (100 mg total) by mouth 2 (two) times daily as needed for mild constipation. Patient not taking: Reported on 06/12/2017 02/06/17   Jene Every, MD  gabapentin (NEURONTIN) 300 MG capsule Take 300 mg by mouth 2 (two) times daily.    [provider]  glyBURIDE (DIABETA) 5 MG tablet Take 5 mg by mouth 2 (two) times  daily with a meal.    [provider]  hydrochlorothiazide (HYDRODIURIL) 25 MG tablet Take 12.5 mg by mouth daily.    [provider]  HYDROcodone-acetaminophen (NORCO/VICODIN) 5-325 MG tablet Take 1-2 tablets by mouth every 4 (four) hours as needed for moderate pain (pain score 4-6). Patient not taking: Reported on 08/04/2017 06/27/17   Julien Girt, Alexzandrew L, PA-C  loratadine (CLARITIN) 10 MG tablet Take 10 mg by mouth daily.    [provider]  losartan (COZAAR) 100 MG tablet Take 100 mg by mouth daily.    [provider]  magnesium oxide (MAG-OX) 400 MG tablet Take 400 mg by mouth daily.    [provider]  Menthol, Topical Analgesic, (BIOFREEZE) 4 % GEL Apply 1 application topically daily as needed (pain).    [provider]  metFORMIN (GLUCOPHAGE) 500 MG tablet Take 500 mg by mouth 2 (two) times daily with a meal.    [provider]  methocarbamol (ROBAXIN) 500 MG tablet Take 1 tablet (500 mg total) by mouth every 6 (six) hours as needed for muscle spasms. Patient taking differently: Take 500 mg by mouth at bedtime as needed for muscle spasms.  06/27/17   Perkins, Alexzandrew L, PA-C  Omega-3 Fatty Acids (FISH OIL) 1200 MG CAPS Take 1,200 mg by mouth at bedtime.    [provider]  OVER THE COUNTER MEDICATION Place 1 Dose under the tongue daily. Hemp Flower Extract 500 mg drops. 1/2 dropperful daily    [provider]  pioglitazone (ACTOS) 45 MG tablet Take 45 mg by mouth daily.    [provider]  polyethylene glycol (MIRALAX / GLYCOLAX) packet Take 17 g by mouth daily. Patient not taking: Reported on 06/12/2017 02/06/17   Jene Every, MD  ranitidine (ZANTAC) 300 MG tablet Take 150 mg by mouth 2 (two) times daily.     [provider]  simvastatin (ZOCOR) 20 MG tablet Take 20 mg by mouth at bedtime.     [provider]  traMADol (ULTRAM) 50 MG tablet Take 1-2 tablets (50-100 mg total) by  mouth every 6 (six) hours as needed (mild pain). 06/27/17   Perkins, Alexzandrew L, PA-C  trolamine salicylate (ASPERCREME) 10 % cream Apply 1 application topically as needed for muscle pain.    [provider]    Family History No family history on file.  Social History Social History   Tobacco Use  . Smoking status: Never Smoker  . Smokeless tobacco: Never Used  Substance Use Topics  . Alcohol use: Yes    Comment: glass wine once a month  . Drug use: No     Allergies   Patient has no known allergies.   Review  of Systems Review of Systems  Constitutional: Negative for chills and fever.  HENT: Negative for congestion and sore throat.   Eyes: Negative for visual disturbance.  Respiratory: Negative for cough, shortness of breath and wheezing.   Cardiovascular: Negative for chest pain and palpitations.  Gastrointestinal: Negative for abdominal pain, diarrhea, nausea and vomiting.  Genitourinary: Negative for dysuria.  Musculoskeletal: Positive for arthralgias. Negative for back pain and neck pain.  Skin: Negative for rash.  Neurological: Negative for weakness, numbness and headaches.     Physical Exam Updated Vital Signs BP (!) 111/56   Pulse 100   Temp 98.4 F (36.9 C) (Oral)   Resp 12   Wt 88 kg   SpO2 99%   BMI 29.07 kg/m   Physical Exam  Constitutional: He appears well-developed and well-nourished. No distress.  HENT:  Head: Normocephalic and atraumatic.  Eyes: Pupils are equal, round, and reactive to light. Conjunctivae are normal. Right eye exhibits no discharge. Left eye exhibits no discharge.  Neck: Neck supple.  Cardiovascular: Normal rate, regular rhythm, normal heart sounds and intact distal pulses.  Bilateral radial, posterior tibialis and dorsalis pedis pulses are intact.    Pulmonary/Chest: Effort normal and breath sounds normal. No respiratory distress.  Abdominal: Soft. There is no tenderness.  Musculoskeletal: He exhibits tenderness.    Tenderness to his right lateral hip.  No overlying skin changes.  Slight shortening of his right leg.  Lymphadenopathy:    He has no cervical adenopathy.  Neurological: He is alert. Coordination normal.  Skin: Skin is warm and dry. Capillary refill takes less than 2 seconds. No rash noted. He is not diaphoretic. No erythema. No pallor.  Psychiatric: He has a normal mood and affect. His behavior is normal.  Nursing note and vitals reviewed.    ED Treatments / Results  Labs (all labs ordered are listed, but only abnormal results are displayed) Labs Reviewed - No data to display  EKG None  Radiology Dg Hip Port Unilat W Or Wo Pelvis 1 View Right  Result Date: 12/10/2017 CLINICAL DATA:  Right hip reduction EXAM: DG HIP (WITH OR WITHOUT PELVIS) 1V PORT RIGHT COMPARISON:  12/10/2017 FINDINGS: The right total hip arthroplasty is now normally approximated. No periprosthetic abnormality. No abnormal lucency. IMPRESSION: Reduction of right hip total arthroplasty, now normally approximated. Electronically Signed   By: Deatra Robinson M.D.   On: 12/10/2017 19:52   Dg Hip Unilat W Or Wo Pelvis 2-3 Views Right  Result Date: 12/10/2017 CLINICAL DATA:  Right hip pain EXAM: DG HIP (WITH OR WITHOUT PELVIS) 2-3V RIGHT COMPARISON:  08/04/2017 right hip radiographs FINDINGS: Superior/posterior dislocation of the right hip. Right total hip arthroplasty. No evidence of hardware or osseous fracture. No suspicious focal osseous lesions. Mild degenerative changes in the visualized lower lumbar spine. IMPRESSION: Superior/posterior right hip dislocation. Right total hip arthroplasty. No evidence of hardware or osseous fracture. Electronically Signed   By: Delbert Phenix M.D.   On: 12/10/2017 17:58    Procedures Reduction of dislocation Date/Time: 12/10/2017 6:20 PM Performed by: Everlene Farrier, PA-C Authorized by: Everlene Farrier, PA-C  Consent: Verbal consent obtained. Written consent obtained. Risks and  benefits: risks, benefits and alternatives were discussed Consent given by: patient Patient understanding: patient states understanding of the procedure being performed Patient consent: the patient's understanding of the procedure matches consent given Procedure consent: procedure consent matches procedure scheduled Relevant documents: relevant documents present and verified Test results: test results available and properly labeled Site marked: the  operative site was marked Imaging studies: imaging studies available Required items: required blood products, implants, devices, and special equipment available Patient identity confirmed: verbally with patient and arm band Time out: Immediately prior to procedure a "time out" was called to verify the correct patient, procedure, equipment, support staff and site/side marked as required. Patient tolerance: Patient tolerated the procedure well with no immediate complications Comments: Reduction of right hip dislocation of non-native hip.     (including critical care time)  Medications Ordered in ED Medications  morphine 4 MG/ML injection 4 mg (0 mg Intravenous Hold 12/10/17 1924)  ondansetron (ZOFRAN) injection 4 mg (4 mg Intravenous Given 12/10/17 1725)  morphine 4 MG/ML injection 4 mg (4 mg Intravenous Given 12/10/17 1725)  propofol (DIPRIVAN) 10 mg/mL bolus/IV push 44 mg (50 mg Intravenous Given 12/10/17 1841)  propofol (DIPRIVAN) 10 mg/mL bolus/IV push (40 mg Intravenous Given 12/10/17 1842)  propofol (DIPRIVAN) 10 mg/mL bolus/IV push (40 mg Intravenous Given 12/10/17 1844)     Initial Impression / Assessment and Plan / ED Course  I have reviewed the triage vital signs and the nursing notes.  Pertinent labs & imaging results that were available during my care of the patient were reviewed by me and considered in my medical decision making (see chart for details).   This  is a 72 y.o. Male who has a history of right hip replacement who presents  to the emergency department complaining of right hip pain today.  Patient reports he was at physical therapy doing a leg press when he believes the left hip came out of joint.  EMS reports rotation and shortening of his right leg.  Patient reports pain to his right hip and pain with movement.  He has had a hip replacement back in 2016 by Dr. Lequita HaltAluisio.  He also reports he had some repair of his ligaments this past February.  No treatments prior to arrival today.  He reports he last had a smoothie around 9 AM this morning.  Nothing since.  He denies numbness or tingling. No other injury. No chest pain or shortness of breath.   On exam the patient is afebrile and nontoxic-appearing.  He has pain with movement of his right leg.  Right leg is shortened.  He has tenderness overlying the lateral aspect of his right hip.  X-ray shows right hip dislocation.  He agrees with plan for joint reduction.  Conscious sedation performed by Dr. Jeraldine LootsLockwood and hip reduction by myself.  Patient tolerated procedure well.  Repeat films were reviewed by myself and show successful reduction of his right non-native hip.  Patient placed in knee immobilizer and he will use walker at home until he follows up with orthopedics.  He already has an appointment to see his orthopedic surgeon in 2 days.  He sees Dr. Lequita HaltAluisio.  Return precautions discussed. I advised the patient to follow-up with their primary care provider this week. I advised the patient to return to the emergency department with new or worsening symptoms or new concerns. The patient and his wife verbalized understanding and agreement with plan.     Final Clinical Impressions(s) / ED Diagnoses   Final diagnoses:  Dislocation of right hip, initial encounter [facility](HCC)  Status post total replacement of right hip    ED Discharge Orders    None       Lorene DyDansie, Ayden Apodaca, PA-C 12/10/17 2011    Gerhard MunchLockwood, Robert, MD 12/10/17 [phone]

## 2017-12-10 NOTE — ED Provider Notes (Signed)
.  Sedation Date/Time: 12/10/2017 6:30 PM Performed by: Gerhard MunchLockwood, Lynora Dymond, MD Authorized by: Gerhard MunchLockwood, Brycen Bean, MD   Consent:    Consent obtained:  Verbal and written   Consent given by:  Patient   Risks discussed:  Allergic reaction, dysrhythmia, inadequate sedation, nausea, prolonged hypoxia resulting in organ damage, prolonged sedation necessitating reversal, respiratory compromise necessitating ventilatory assistance and intubation and vomiting   Alternatives discussed:  Analgesia without sedation, anxiolysis and regional anesthesia Universal protocol:    Procedure explained and questions answered to patient or proxy's satisfaction: yes     Relevant documents present and verified: yes     Test results available and properly labeled: yes     Imaging studies available: yes     Required blood products, implants, devices, and special equipment available: yes     Site/side marked: yes     Immediately prior to procedure a time out was called: yes     Patient identity confirmation method:  Verbally with patient Indications:    Procedure necessitating sedation performed by:  Physician performing sedation   Intended level of sedation:  Deep Pre-sedation assessment:    Time since last food or drink:  4   ASA classification: class 2 - patient with mild systemic disease     Neck mobility: reduced     Mouth opening:  3 or more finger widths   Thyromental distance:  3 finger widths   Mallampati score:  II - soft palate, uvula, fauces visible   Pre-sedation assessments completed and reviewed: airway patency, cardiovascular function, hydration status, mental status, nausea/vomiting, pain level, respiratory function and temperature     Pre-sedation assessment completed:  12/10/2017 6:30 PM Immediate pre-procedure details:    Reassessment: Patient reassessed immediately prior to procedure     Reviewed: vital signs, relevant labs/tests and NPO status     Verified: bag valve mask available, emergency  equipment available, intubation equipment available, IV patency confirmed, oxygen available and suction available   Procedure details (see MAR for exact dosages):    Preoxygenation:  Nasal cannula   Sedation:  Propofol   Intra-procedure monitoring:  Blood pressure monitoring, cardiac monitor, continuous pulse oximetry, frequent LOC assessments, frequent vital sign checks and continuous capnometry   Intra-procedure events: none     Intra-procedure management:  Supplemental oxygen   Total Provider sedation time (minutes):  20 Post-procedure details:    Post-sedation assessment completed:  12/10/2017 7:20 PM   Attendance: Constant attendance by certified staff until patient recovered     Recovery: Patient returned to pre-procedure baseline     Post-sedation assessments completed and reviewed: airway patency, cardiovascular function, hydration status, mental status, nausea/vomiting, pain level, respiratory function and temperature     Patient is stable for discharge or admission: yes     Patient tolerance:  Tolerated well, no immediate complications Comments:     He required multiple doses of propofol to achieve appropriate relaxation.       Gerhard MunchLockwood, Liller Yohn, MD 12/10/17 2340

## 2017-12-10 NOTE — Discharge Instructions (Addendum)
Please read and follow all provided instructions.  Your diagnoses today include:  1. Dislocation of right hip, initial encounter (HCC)   2. Status post total replacement of right hip     Please follow up with orthopedics this week.   Tests performed today include: Vital signs. See below for your results today.   Medications prescribed:  Take as prescribed   Home care instructions:  Follow any educational materials contained in this packet.  Follow-up instructions: Please follow-up with your orthopedic surgeon and primary care provider for further evaluation of symptoms and treatment   Return instructions:  Please return to the Emergency Department if you do not get better, if you get worse, or new symptoms OR  - Fever (temperature greater than 101.36F)  - Bleeding that does not stop with holding pressure to the area    -Severe pain (please note that you may be more sore the day after your accident)  - Chest Pain  - Difficulty breathing  - Severe nausea or vomiting  - Inability to tolerate food and liquids  - Passing out  - Skin becoming red around your wounds  - Change in mental status (confusion or lethargy)  - New numbness or weakness    Please return if you have any other emergent concerns.  Additional Information:  Your vital signs today were: BP (!) 111/56    Pulse 100    Temp 98.4 F (36.9 C) (Oral)    Resp 12    Wt 88 kg    SpO2 99%    BMI 29.07 kg/m  If your blood pressure (BP) was elevated above 135/85 this visit, please have this repeated by your doctor within one month. ---------------

## 2017-12-10 NOTE — Sedation Documentation (Signed)
Patient was placed on non-rebreather due to steady decline of oxygen into the high 80's (88%) and shallow breathing. [facility] and said "good morning".

## 2017-12-12 DIAGNOSIS — Z96641 Presence of right artificial hip joint: Secondary | ICD-10-CM | POA: Diagnosis not present

## 2017-12-17 DIAGNOSIS — R2689 Other abnormalities of gait and mobility: Secondary | ICD-10-CM | POA: Diagnosis not present

## 2017-12-17 DIAGNOSIS — M7061 Trochanteric bursitis, right hip: Secondary | ICD-10-CM | POA: Diagnosis not present

## 2017-12-17 DIAGNOSIS — M25551 Pain in right hip: Secondary | ICD-10-CM | POA: Diagnosis not present

## 2017-12-20 DIAGNOSIS — M7061 Trochanteric bursitis, right hip: Secondary | ICD-10-CM | POA: Diagnosis not present

## 2017-12-20 DIAGNOSIS — M25551 Pain in right hip: Secondary | ICD-10-CM | POA: Diagnosis not present

## 2017-12-20 DIAGNOSIS — R2689 Other abnormalities of gait and mobility: Secondary | ICD-10-CM | POA: Diagnosis not present

## 2017-12-23 DIAGNOSIS — M25551 Pain in right hip: Secondary | ICD-10-CM | POA: Diagnosis not present

## 2017-12-23 DIAGNOSIS — M7061 Trochanteric bursitis, right hip: Secondary | ICD-10-CM | POA: Diagnosis not present

## 2017-12-23 DIAGNOSIS — R2689 Other abnormalities of gait and mobility: Secondary | ICD-10-CM | POA: Diagnosis not present

## 2017-12-27 DIAGNOSIS — M7061 Trochanteric bursitis, right hip: Secondary | ICD-10-CM | POA: Diagnosis not present

## 2017-12-27 DIAGNOSIS — R2689 Other abnormalities of gait and mobility: Secondary | ICD-10-CM | POA: Diagnosis not present

## 2017-12-27 DIAGNOSIS — M25551 Pain in right hip: Secondary | ICD-10-CM | POA: Diagnosis not present

## 2018-01-01 DIAGNOSIS — M7061 Trochanteric bursitis, right hip: Secondary | ICD-10-CM | POA: Diagnosis not present

## 2018-01-01 DIAGNOSIS — R2689 Other abnormalities of gait and mobility: Secondary | ICD-10-CM | POA: Diagnosis not present

## 2018-01-01 DIAGNOSIS — M25551 Pain in right hip: Secondary | ICD-10-CM | POA: Diagnosis not present

## 2018-01-03 DIAGNOSIS — R2689 Other abnormalities of gait and mobility: Secondary | ICD-10-CM | POA: Diagnosis not present

## 2018-01-03 DIAGNOSIS — M25551 Pain in right hip: Secondary | ICD-10-CM | POA: Diagnosis not present

## 2018-01-03 DIAGNOSIS — M7061 Trochanteric bursitis, right hip: Secondary | ICD-10-CM | POA: Diagnosis not present

## 2018-01-04 DIAGNOSIS — Z803 Family history of malignant neoplasm of breast: Secondary | ICD-10-CM | POA: Diagnosis not present

## 2018-01-08 DIAGNOSIS — M7061 Trochanteric bursitis, right hip: Secondary | ICD-10-CM | POA: Diagnosis not present

## 2018-01-08 DIAGNOSIS — R2689 Other abnormalities of gait and mobility: Secondary | ICD-10-CM | POA: Diagnosis not present

## 2018-01-08 DIAGNOSIS — M25551 Pain in right hip: Secondary | ICD-10-CM | POA: Diagnosis not present

## 2018-01-10 DIAGNOSIS — M25551 Pain in right hip: Secondary | ICD-10-CM | POA: Diagnosis not present

## 2018-01-10 DIAGNOSIS — M7061 Trochanteric bursitis, right hip: Secondary | ICD-10-CM | POA: Diagnosis not present

## 2018-01-10 DIAGNOSIS — R2689 Other abnormalities of gait and mobility: Secondary | ICD-10-CM | POA: Diagnosis not present

## 2018-01-15 DIAGNOSIS — M25551 Pain in right hip: Secondary | ICD-10-CM | POA: Diagnosis not present

## 2018-01-15 DIAGNOSIS — R2689 Other abnormalities of gait and mobility: Secondary | ICD-10-CM | POA: Diagnosis not present

## 2018-01-15 DIAGNOSIS — M7061 Trochanteric bursitis, right hip: Secondary | ICD-10-CM | POA: Diagnosis not present

## 2018-01-17 DIAGNOSIS — M7061 Trochanteric bursitis, right hip: Secondary | ICD-10-CM | POA: Diagnosis not present

## 2018-01-17 DIAGNOSIS — R2689 Other abnormalities of gait and mobility: Secondary | ICD-10-CM | POA: Diagnosis not present

## 2018-01-17 DIAGNOSIS — M25551 Pain in right hip: Secondary | ICD-10-CM | POA: Diagnosis not present

## 2018-01-22 DIAGNOSIS — M7061 Trochanteric bursitis, right hip: Secondary | ICD-10-CM | POA: Diagnosis not present

## 2018-01-22 DIAGNOSIS — M25551 Pain in right hip: Secondary | ICD-10-CM | POA: Diagnosis not present

## 2018-01-22 DIAGNOSIS — R2689 Other abnormalities of gait and mobility: Secondary | ICD-10-CM | POA: Diagnosis not present

## 2018-01-23 DIAGNOSIS — Z96641 Presence of right artificial hip joint: Secondary | ICD-10-CM | POA: Diagnosis not present

## 2018-01-24 DIAGNOSIS — R2689 Other abnormalities of gait and mobility: Secondary | ICD-10-CM | POA: Diagnosis not present

## 2018-01-24 DIAGNOSIS — M25551 Pain in right hip: Secondary | ICD-10-CM | POA: Diagnosis not present

## 2018-01-24 DIAGNOSIS — M7061 Trochanteric bursitis, right hip: Secondary | ICD-10-CM | POA: Diagnosis not present

## 2018-01-29 DIAGNOSIS — M7061 Trochanteric bursitis, right hip: Secondary | ICD-10-CM | POA: Diagnosis not present

## 2018-01-29 DIAGNOSIS — R2689 Other abnormalities of gait and mobility: Secondary | ICD-10-CM | POA: Diagnosis not present

## 2018-01-29 DIAGNOSIS — M25551 Pain in right hip: Secondary | ICD-10-CM | POA: Diagnosis not present

## 2018-01-31 DIAGNOSIS — M25551 Pain in right hip: Secondary | ICD-10-CM | POA: Diagnosis not present

## 2018-01-31 DIAGNOSIS — M7061 Trochanteric bursitis, right hip: Secondary | ICD-10-CM | POA: Diagnosis not present

## 2018-01-31 DIAGNOSIS — R2689 Other abnormalities of gait and mobility: Secondary | ICD-10-CM | POA: Diagnosis not present

## 2018-02-05 DIAGNOSIS — R2689 Other abnormalities of gait and mobility: Secondary | ICD-10-CM | POA: Diagnosis not present

## 2018-02-05 DIAGNOSIS — M25551 Pain in right hip: Secondary | ICD-10-CM | POA: Diagnosis not present

## 2018-02-05 DIAGNOSIS — M7061 Trochanteric bursitis, right hip: Secondary | ICD-10-CM | POA: Diagnosis not present

## 2018-02-07 DIAGNOSIS — R2689 Other abnormalities of gait and mobility: Secondary | ICD-10-CM | POA: Diagnosis not present

## 2018-02-07 DIAGNOSIS — M7061 Trochanteric bursitis, right hip: Secondary | ICD-10-CM | POA: Diagnosis not present

## 2018-02-07 DIAGNOSIS — M25551 Pain in right hip: Secondary | ICD-10-CM | POA: Diagnosis not present

## 2018-02-12 DIAGNOSIS — M25551 Pain in right hip: Secondary | ICD-10-CM | POA: Diagnosis not present

## 2018-02-12 DIAGNOSIS — M7061 Trochanteric bursitis, right hip: Secondary | ICD-10-CM | POA: Diagnosis not present

## 2018-02-12 DIAGNOSIS — R2689 Other abnormalities of gait and mobility: Secondary | ICD-10-CM | POA: Diagnosis not present

## 2018-02-14 DIAGNOSIS — R2689 Other abnormalities of gait and mobility: Secondary | ICD-10-CM | POA: Diagnosis not present

## 2018-02-14 DIAGNOSIS — M7061 Trochanteric bursitis, right hip: Secondary | ICD-10-CM | POA: Diagnosis not present

## 2018-02-14 DIAGNOSIS — M25551 Pain in right hip: Secondary | ICD-10-CM | POA: Diagnosis not present

## 2018-03-05 DIAGNOSIS — M25551 Pain in right hip: Secondary | ICD-10-CM | POA: Diagnosis not present

## 2018-04-18 DIAGNOSIS — Z471 Aftercare following joint replacement surgery: Secondary | ICD-10-CM | POA: Diagnosis not present

## 2018-04-18 DIAGNOSIS — Z96651 Presence of right artificial knee joint: Secondary | ICD-10-CM | POA: Diagnosis not present

## 2018-04-18 DIAGNOSIS — M7061 Trochanteric bursitis, right hip: Secondary | ICD-10-CM | POA: Diagnosis not present

## 2018-04-18 DIAGNOSIS — M25551 Pain in right hip: Secondary | ICD-10-CM | POA: Diagnosis not present

## 2018-05-06 ENCOUNTER — Other Ambulatory Visit: Payer: Self-pay

## 2018-05-06 ENCOUNTER — Emergency Department (HOSPITAL_COMMUNITY): Payer: Medicare Other

## 2018-05-06 ENCOUNTER — Emergency Department (HOSPITAL_COMMUNITY)
Admission: EM | Admit: 2018-05-06 | Discharge: 2018-05-06 | Disposition: A | Discharge status: Home / Self Care | Payer: Medicare Other | Attending: Emergency Medicine | Admitting: Emergency Medicine

## 2018-05-06 ENCOUNTER — Encounter (HOSPITAL_COMMUNITY): Payer: Self-pay

## 2018-05-06 DIAGNOSIS — Y9389 Activity, other specified: Secondary | ICD-10-CM | POA: Insufficient documentation

## 2018-05-06 DIAGNOSIS — Y998 Other external cause status: Secondary | ICD-10-CM | POA: Diagnosis not present

## 2018-05-06 DIAGNOSIS — I10 Essential (primary) hypertension: Secondary | ICD-10-CM | POA: Insufficient documentation

## 2018-05-06 DIAGNOSIS — X58XXXA Exposure to other specified factors, initial encounter: Secondary | ICD-10-CM | POA: Diagnosis not present

## 2018-05-06 DIAGNOSIS — R52 Pain, unspecified: Secondary | ICD-10-CM | POA: Diagnosis not present

## 2018-05-06 DIAGNOSIS — S73004A Unspecified dislocation of right hip, initial encounter: Secondary | ICD-10-CM | POA: Insufficient documentation

## 2018-05-06 DIAGNOSIS — Y92009 Unspecified place in unspecified non-institutional (private) residence as the place of occurrence of the external cause: Secondary | ICD-10-CM | POA: Insufficient documentation

## 2018-05-06 DIAGNOSIS — E119 Type 2 diabetes mellitus without complications: Secondary | ICD-10-CM | POA: Insufficient documentation

## 2018-05-06 DIAGNOSIS — T84020A Dislocation of internal right hip prosthesis, initial encounter: Secondary | ICD-10-CM | POA: Diagnosis not present

## 2018-05-06 DIAGNOSIS — S79911A Unspecified injury of right hip, initial encounter: Secondary | ICD-10-CM | POA: Diagnosis present

## 2018-05-06 DIAGNOSIS — T07XXXA Unspecified multiple injuries, initial encounter: Secondary | ICD-10-CM | POA: Diagnosis not present

## 2018-05-06 MED ORDER — FENTANYL CITRATE (PF) 100 MCG/2ML IJ SOLN
50.0000 ug | Freq: Once | INTRAMUSCULAR | Status: AC
  Administered 2018-05-06: 50 ug via INTRAVENOUS
  Filled 2018-05-06: qty 2

## 2018-05-06 MED ORDER — SODIUM CHLORIDE 0.9 % IV BOLUS
250.0000 mL | Freq: Once | INTRAVENOUS | Status: AC
  Administered 2018-05-06: 250 mL via INTRAVENOUS

## 2018-05-06 MED ORDER — PROPOFOL 10 MG/ML IV BOLUS
INTRAVENOUS | Status: AC | PRN
  Administered 2018-05-06: 20 mg via INTRAVENOUS

## 2018-05-06 MED ORDER — PROPOFOL 10 MG/ML IV BOLUS: 0.5000 mg/kg | Freq: Once | INTRAVENOUS | Status: DC

## 2018-05-06 MED ORDER — ONDANSETRON HCL 4 MG/2ML IJ SOLN
4.0000 mg | Freq: Once | INTRAMUSCULAR | Status: AC
  Administered 2018-05-06: 4 mg via INTRAVENOUS
  Filled 2018-05-06: qty 2

## 2018-05-06 MED ORDER — PROPOFOL 10 MG/ML IV BOLUS
INTRAVENOUS | Status: AC
  Filled 2018-05-06: qty 20

## 2018-05-06 MED ORDER — PROPOFOL 10 MG/ML IV BOLUS
INTRAVENOUS | Status: AC | PRN
  Administered 2018-05-06 (×2): 40 mg via INTRAVENOUS
  Administered 2018-05-06: 20 mg via INTRAVENOUS

## 2018-05-06 NOTE — ED Notes (Signed)
EKG has been shot, Not released until order is needed or put in.

## 2018-05-06 NOTE — Sedation Documentation (Signed)
Patient oxygen saturation dropped and required BMV

## 2018-05-06 NOTE — Discharge Instructions (Signed)
Use knee immobilizer till ortho tells you you don't need it.

## 2018-05-06 NOTE — ED Notes (Signed)
Bed: WA07 Expected date:  Expected time:  Means of arrival:  Comments: EMS- 72yo M, hip dislocation

## 2018-05-06 NOTE — ED Triage Notes (Signed)
Patient BIB EMS from home. Patient reports he bent over to put on his socks and felt his right hip pop. Patient right lower extremity inwardly rotated, but no shortening noted by EMS. Patient complaining of pain to the RLE. Distal pulses intact bilaterally and marked. Patient denies numbness/tingling to RLE. Patient has history of Right knee surgery and right hip surgery at [facility], as well as HTN, DM, and back problems.  EMS VS: 122/74, 80SR, 18RR, 99% RA, 18g Left AC PIV placed by EMS.

## 2018-05-06 NOTE — ED Provider Notes (Signed)
[facility]-EMERGENCY DEPT Provider Note   CSN: [id number] Arrival date & time: 05/06/18  1205     History   Chief Complaint Chief Complaint  Patient presents with  . Hip Pain    HPI Lance Hill is a 73 y.o. male.  The history is provided by the patient.  Hip Pain  This is a recurrent problem. The current episode started 1 to 2 hours ago. The problem occurs constantly. The problem has not changed since onset.Associated symptoms comments: Bending over to put on his socks and his right hip came out.  Unable to walk or move the leg due to severe pain. The symptoms are aggravated by bending and twisting. Nothing relieves the symptoms. He has tried nothing for the symptoms. The treatment provided no relief.    Past Medical History:  Diagnosis Date  . Arthritis   . Diabetes mellitus without complication (HCC)    type II   . GERD (gastroesophageal reflux disease)   . History of kidney stones    hx of   . Hypertension     Patient Active Problem List   Diagnosis Date Noted  . Failed total hip arthroplasty, subsequent encounter 06/26/2017  . Failed total hip arthroplasty, sequela 06/26/2017  . Spinal stenosis of lumbar region 02/06/2017  . Spinal stenosis, lumbar 02/06/2017    Past Surgical History:  Procedure Laterality Date  . BACK SURGERY    . CHOLECYSTECTOMY    . EYE SURGERY     right eye surgery , bilateral cataract   . JOINT REPLACEMENT     hip and knee replacements   . LUMBAR LAMINECTOMY/DECOMPRESSION MICRODISCECTOMY Right 02/06/2017   Procedure: Microlumbar decompression L5-S1 right;  Surgeon: Jene EveryBeane, Jeffrey, MD;  Location: [facility];  Service: Orthopedics;  Laterality: Right;  90 mins  . TONSILLECTOMY    . TOTAL HIP REVISION Right 06/26/2017   Procedure: Right hip femoral head revision and abductor tendon repair;  Surgeon: Ollen GrossAluisio, Frank, MD;  Location: [facility];  Service: Orthopedics;  Laterality: Right;        Home Medications    Prior to  Admission medications   Medication Sig Start Date End Date Taking? Authorizing Provider  acetaminophen (TYLENOL) 325 MG tablet Take 650 mg by mouth every 6 (six) hours as needed for moderate pain.    [provider]  amLODipine (NORVASC) 10 MG tablet Take 10 mg by mouth at bedtime.     [provider]  ARTIFICIAL TEAR OP Place 1 drop into both eyes daily as needed (for dry eyes).    [provider]  aspirin EC 325 MG EC tablet Take 1 tablet (325 mg total) by mouth daily with breakfast. Take a full dose enteric coated 325 mg Aspirin daily for three weeks.   Once the patient has completed the three weeks of full dose Aspirin, then they may reduce back to the 81 mg Aspirin daily at home. Patient not taking: Reported on 08/04/2017 06/28/17   Julien GirtPerkins, Alexzandrew L, PA-C  aspirin EC 81 MG tablet Take 81 mg by mouth at bedtime.    [provider]  Brinzolamide-Brimonidine 1-0.2 % SUSP Place 1 drop into both eyes 3 (three) times daily.    [provider]  Cholecalciferol ([employer] VITAMIN D3) 1000 units capsule Take 2,000 Units by mouth daily.    [provider]  docusate sodium (COLACE) 100 MG capsule Take 1 capsule (100 mg total) by mouth 2 (two) times daily as needed for mild constipation. Patient  not taking: Reported on 12/10/2017 02/06/17   Jene EveryBeane, Jeffrey, MD  gabapentin (NEURONTIN) 300 MG capsule Take 300 mg by mouth 2 (two) times daily.    [provider]  glyBURIDE (DIABETA) 5 MG tablet Take 5 mg by mouth 2 (two) times daily with a meal.    [provider]  hydrochlorothiazide (HYDRODIURIL) 25 MG tablet Take 12.5 mg by mouth daily.    [provider]  HYDROcodone-acetaminophen (NORCO/VICODIN) 5-325 MG tablet Take 1-2 tablets by mouth every 4 (four) hours as needed for moderate pain (pain score 4-6). Patient not taking: Reported on 12/10/2017 06/27/17   Julien GirtPerkins, Alexzandrew L, PA-C  loratadine (CLARITIN) 10 MG tablet Take 10 mg by  mouth daily.    [provider]  losartan (COZAAR) 100 MG tablet Take 100 mg by mouth daily.    [provider]  magnesium oxide (MAG-OX) 400 MG tablet Take 400 mg by mouth daily.    [provider]  Menthol, Topical Analgesic, (BIOFREEZE) 4 % GEL Apply 1 application topically daily as needed (pain).    [provider]  metFORMIN (GLUCOPHAGE) 500 MG tablet Take 500 mg by mouth 2 (two) times daily with a meal.    [provider]  methocarbamol (ROBAXIN) 500 MG tablet Take 1 tablet (500 mg total) by mouth every 6 (six) hours as needed for muscle spasms. Patient taking differently: Take 500 mg by mouth at bedtime as needed for muscle spasms.  06/27/17   Perkins, Alexzandrew L, PA-C  Omega-3 Fatty Acids (FISH OIL) 1200 MG CAPS Take 1,200 mg by mouth at bedtime.    [provider]  OVER THE COUNTER MEDICATION Place 1 Dose under the tongue daily. Hemp Flower Extract 500 mg drops. 1/2 dropperful daily    [provider]  pioglitazone (ACTOS) 45 MG tablet Take 45 mg by mouth daily.    [provider]  polyethylene glycol (MIRALAX / GLYCOLAX) packet Take 17 g by mouth daily. Patient not taking: Reported on 06/12/2017 02/06/17   Jene EveryBeane, Jeffrey, MD  ranitidine (ZANTAC) 300 MG tablet Take 150 mg by mouth 2 (two) times daily.     [provider]  simvastatin (ZOCOR) 20 MG tablet Take 20 mg by mouth at bedtime.     [provider]  traMADol (ULTRAM) 50 MG tablet Take 1-2 tablets (50-100 mg total) by mouth every 6 (six) hours as needed (mild pain). 06/27/17   Perkins, Alexzandrew L, PA-C  trolamine salicylate (ASPERCREME) 10 % cream Apply 1 application topically as needed for muscle pain.    [provider]    Family History History reviewed. No pertinent family history.  Social History Social History   Tobacco Use  . Smoking status: Never Smoker  . Smokeless tobacco: Never Used  Substance Use Topics  .  Alcohol use: Yes    Comment: glass wine once a month  . Drug use: No     Allergies   Patient has no known allergies.   Review of Systems Review of Systems  All other systems reviewed and are negative.    Physical Exam Updated Vital Signs BP (!) 141/88 (BP Location: Right Arm)   Pulse 80   Temp (!) 97.5 F (36.4 C) (Oral)   Resp (!) 8   SpO2 100%   Physical Exam Vitals signs and nursing note reviewed.  Constitutional:      General: He is not in acute distress.    Appearance: He is well-developed.  HENT:  Head: Normocephalic and atraumatic.  Eyes:     Conjunctiva/sclera: Conjunctivae normal.     Pupils: Pupils are equal, round, and reactive to light.  Neck:     Musculoskeletal: Normal range of motion and neck supple.  Cardiovascular:     Rate and Rhythm: Normal rate and regular rhythm.     Pulses: Normal pulses.     Heart sounds: No murmur.  Pulmonary:     Effort: Pulmonary effort is normal. No respiratory distress.     Breath sounds: Normal breath sounds. No wheezing or rales.  Abdominal:     General: There is no distension.     Palpations: Abdomen is soft.     Tenderness: There is no abdominal tenderness. There is no guarding or rebound.  Musculoskeletal:        General: Deformity present.     Right hip: He exhibits decreased range of motion, tenderness, bony tenderness and deformity.       Legs:  Skin:    General: Skin is warm and dry.     Findings: No erythema or rash.  Neurological:     Mental Status: He is alert and oriented to person, place, and time.  Psychiatric:        Behavior: Behavior normal.      ED Treatments / Results  Labs (all labs ordered are listed, but only abnormal results are displayed) Labs Reviewed - No data to display  EKG None  Radiology Dg Hip Port Unilat W Or Wo Pelvis 1 View Right  Result Date: 05/06/2018 CLINICAL DATA:  Post RIGHT hip reduction EXAM: DG HIP (WITH OR WITHOUT PELVIS) 1V PORT RIGHT COMPARISON:   Portable exam 1306 hours compared to 12/17 hours FINDINGS: Previously identified superior dislocation of the RIGHT hip prosthesis appears reduced on single AP view. No fractures identified. IMPRESSION: Reduction of previously identified RIGHT hip dislocation. Electronically Signed   By: Ulyses Southward M.D.   On: 05/06/2018 13:33   Dg Hip Port Unilat W Or Wo Pelvis 1 View Right  Result Date: 05/06/2018 CLINICAL DATA:  Dislocation, pain and deformity EXAM: DG HIP (WITH OR WITHOUT PELVIS) 1V PORT RIGHT COMPARISON:  12/10/2017 FINDINGS: Superior dislocation of the femoral component of the RIGHT hip prosthesis from the acetabular cup identified on single AP view. Bones appear mildly demineralized. No fractures identified. Visualized pelvis intact with preservation of LEFT hip joint space. IMPRESSION: Superior dislocation of the RIGHT hip prosthesis. Electronically Signed   By: Ulyses Southward M.D.   On: 05/06/2018 12:46    Procedures .Sedation Date/Time: 05/06/2018 2:11 PM Performed by: Gwyneth Sprout, MD Authorized by: Gwyneth Sprout, MD   Consent:    Consent obtained:  Verbal   Consent given by:  Patient   Risks discussed:  Allergic reaction, dysrhythmia, inadequate sedation, nausea, prolonged hypoxia resulting in organ damage, prolonged sedation necessitating reversal, respiratory compromise necessitating ventilatory assistance and intubation and vomiting   Alternatives discussed:  Analgesia without sedation, anxiolysis and regional anesthesia Universal protocol:    Procedure explained and questions answered to patient or proxy's satisfaction: yes     Relevant documents present and verified: yes     Test results available and properly labeled: yes     Imaging studies available: yes     Required blood products, implants, devices, and special equipment available: yes     Site/side marked: yes     Immediately prior to procedure a time out was called: yes     Patient identity confirmation method:  Verbally with patient and arm band Indications:    Procedure performed:  Dislocation reduction   Procedure necessitating sedation performed by:  Physician performing sedation Pre-sedation assessment:    Time since last food or drink:  2 hours   ASA classification: class 2 - patient with mild systemic disease     Neck mobility: normal     Mouth opening:  3 or more finger widths   Thyromental distance:  4 finger widths   Mallampati score:  I - soft palate, uvula, fauces, pillars visible   Pre-sedation assessments completed and reviewed: airway patency, cardiovascular function, hydration status, mental status, nausea/vomiting, pain level, respiratory function and temperature     Pre-sedation assessment completed:  05/06/2018 12:11 PM Immediate pre-procedure details:    Reassessment: Patient reassessed immediately prior to procedure     Reviewed: vital signs, relevant labs/tests and NPO status     Verified: bag valve mask available, emergency equipment available, intubation equipment available, IV patency confirmed, oxygen available and suction available   Procedure details (see MAR for exact dosages):    Preoxygenation:  Nasal cannula   Sedation:  Propofol   Analgesia:  Fentanyl   Intra-procedure monitoring:  Blood pressure monitoring, cardiac monitor, continuous pulse oximetry, frequent LOC assessments, frequent vital sign checks and continuous capnometry   Intra-procedure events: hypoxia     Intra-procedure management:  Airway repositioning and BVM ventilation   Total Provider sedation time (minutes):  10 Post-procedure details:    Post-sedation assessment completed:  05/06/2018 1:12 PM   Attendance: Constant attendance by certified staff until patient recovered     Recovery: Patient returned to pre-procedure baseline     Post-sedation assessments completed and reviewed: airway patency, cardiovascular function, hydration status, mental status, nausea/vomiting, pain level, respiratory function  and temperature     Patient is stable for discharge or admission: yes     Patient tolerance:  Tolerated well, no immediate complications Reduction of dislocation Date/Time: 05/06/2018 2:12 PM Performed by: Gwyneth Sprout, MD Authorized by: Gwyneth Sprout, MD  Consent: Verbal consent obtained. Written consent obtained. Risks and benefits: risks, benefits and alternatives were discussed Consent given by: patient Patient understanding: patient states understanding of the procedure being performed Patient consent: the patient's understanding of the procedure matches consent given Imaging studies: imaging studies available Patient identity confirmed: verbally with patient Time out: Immediately prior to procedure a "time out" was called to verify the correct patient, procedure, equipment, support staff and site/side marked as required. Local anesthesia used: no  Anesthesia: Local anesthesia used: no  Sedation: Patient sedated: yes Sedation type: moderate (conscious) sedation Sedatives: propofol Analgesia: fentanyl  Patient tolerance: Patient tolerated the procedure well with no immediate complications Comments: With flexion and rotation the hip reduction was achieved.  2+ of the foot.    (including critical care time)  Medications Ordered in ED Medications  propofol (DIPRIVAN) 10 mg/mL bolus/IV push 0.5 mg/kg (has no administration in time range)  fentaNYL (SUBLIMAZE) injection 50 mcg (has no administration in time range)  sodium chloride 0.9 % bolus 250 mL (has no administration in time range)  propofol (DIPRIVAN) 10 mg/mL bolus/IV push (has no administration in time range)  ondansetron (ZOFRAN) injection 4 mg (4 mg Intravenous Given 05/06/18 1231)     Initial Impression / Assessment and Plan / ED Course  I have reviewed the triage vital signs and the nursing notes.  Pertinent labs & imaging results that were available during my care of the patient were reviewed by me and  considered in my medical decision making (see chart for details).     Presenting with recurrent hip dislocation.  Today patient was just bending over putting his socks on.  He is otherwise well-appearing.  Neurovascular intact.  This is the third dislocation in 1 year.  X-ray pending.  Plan for propofol sedation.  2:13 PM Reduction films show appropriate placement.  Patient is able to ambulate around the department with a walker out difficulty.  Patient is in a knee immobilizer and will call [facility] for appointment later this week and further management.  Final Clinical Impressions(s) / ED Diagnoses   Final diagnoses:  Dislocation of right hip, initial encounter [facility])    ED Discharge Orders    None       Gwyneth Sprout, MD 05/06/18 1413

## 2018-05-22 DIAGNOSIS — Z96641 Presence of right artificial hip joint: Secondary | ICD-10-CM | POA: Diagnosis not present

## 2018-05-22 DIAGNOSIS — Z471 Aftercare following joint replacement surgery: Secondary | ICD-10-CM | POA: Diagnosis not present

## 2018-07-02 DIAGNOSIS — J069 Acute upper respiratory infection, unspecified: Secondary | ICD-10-CM | POA: Diagnosis not present

## 2018-07-02 DIAGNOSIS — Z6832 Body mass index (BMI) 32.0-32.9, adult: Secondary | ICD-10-CM | POA: Diagnosis not present

## 2018-07-07 IMAGING — DX DG SPINE 1V PORT
1 series · 1 of 1 positions shown · non-contrast
Comparison: Radiographs dated 01/29/2017

CLINICAL DATA: Lumbar disc disease.

EXAM:
PORTABLE SPINE - 1 VIEW

[l-spine lat]
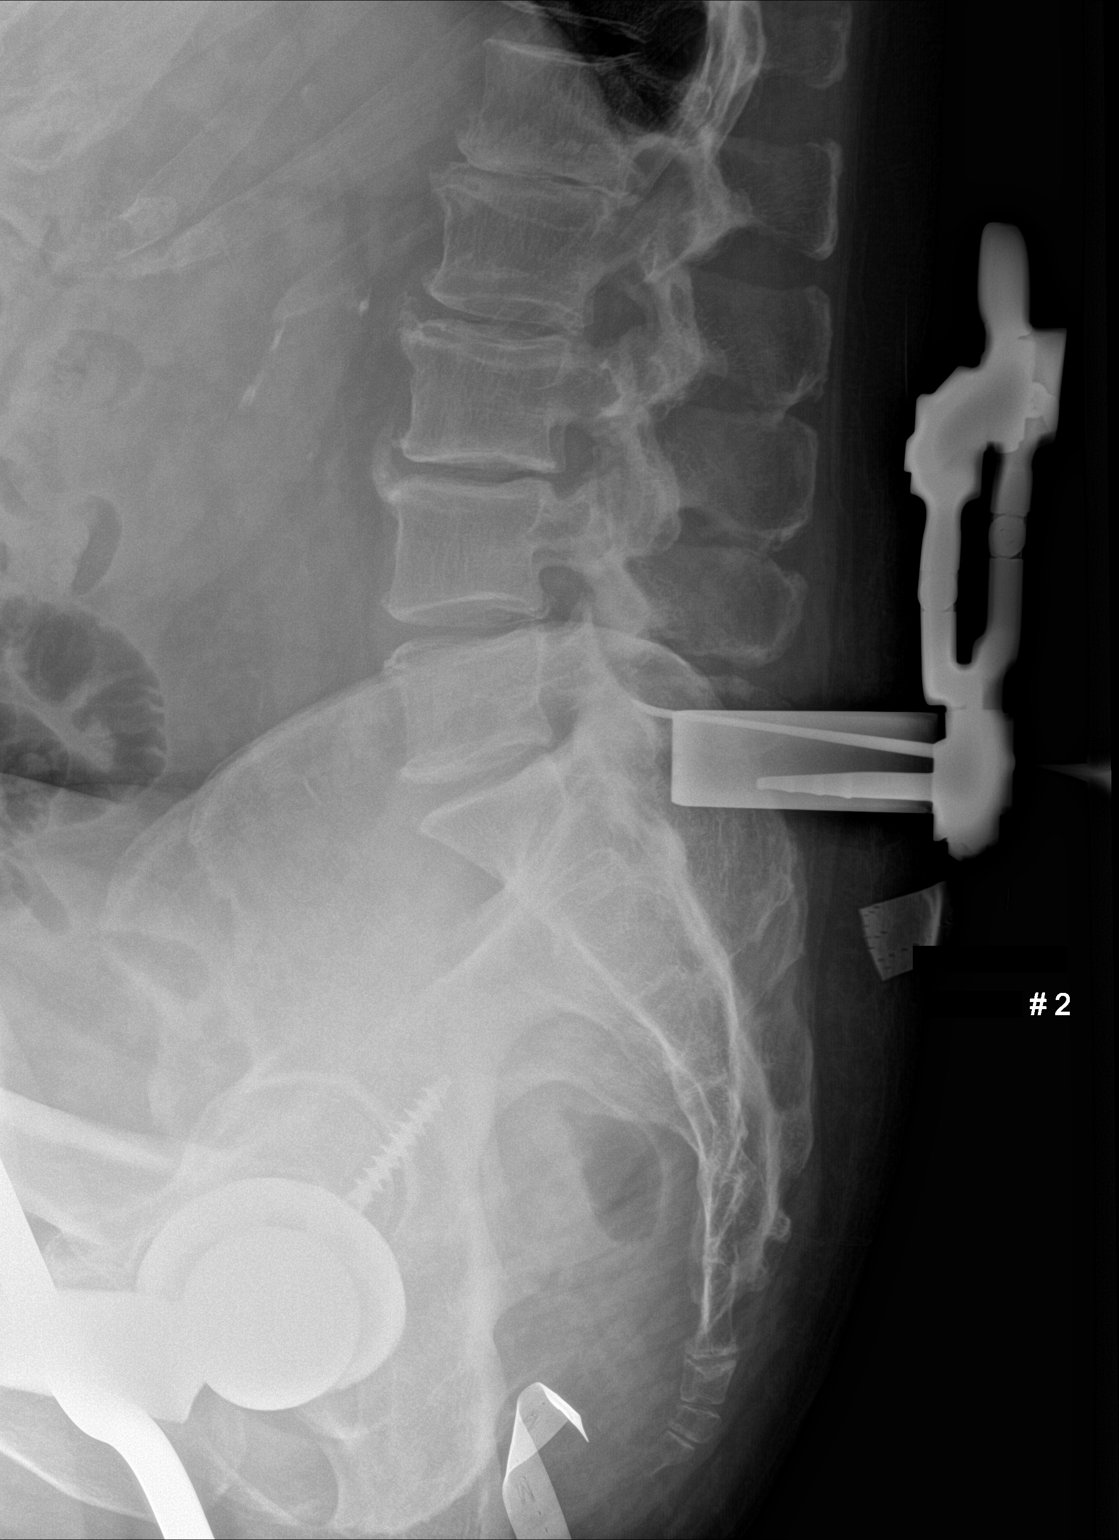

[1 of 1 positions shown; findings below may reference images not displayed]

FINDINGS: Image number 2 demonstrates instruments at the L5-S1 level.
IMPRESSION: Instruments at L5-S1.

## 2018-07-07 IMAGING — DX DG SPINE 1V PORT
1 series · 1 of 1 positions shown · non-contrast
Comparison: Plain films 01/29/2017. Portable radiograph earlier
today.

CLINICAL DATA: Back pain.  Surgical level L5-S1.

EXAM:
PORTABLE SPINE - 1 VIEW

[l-spine lat]
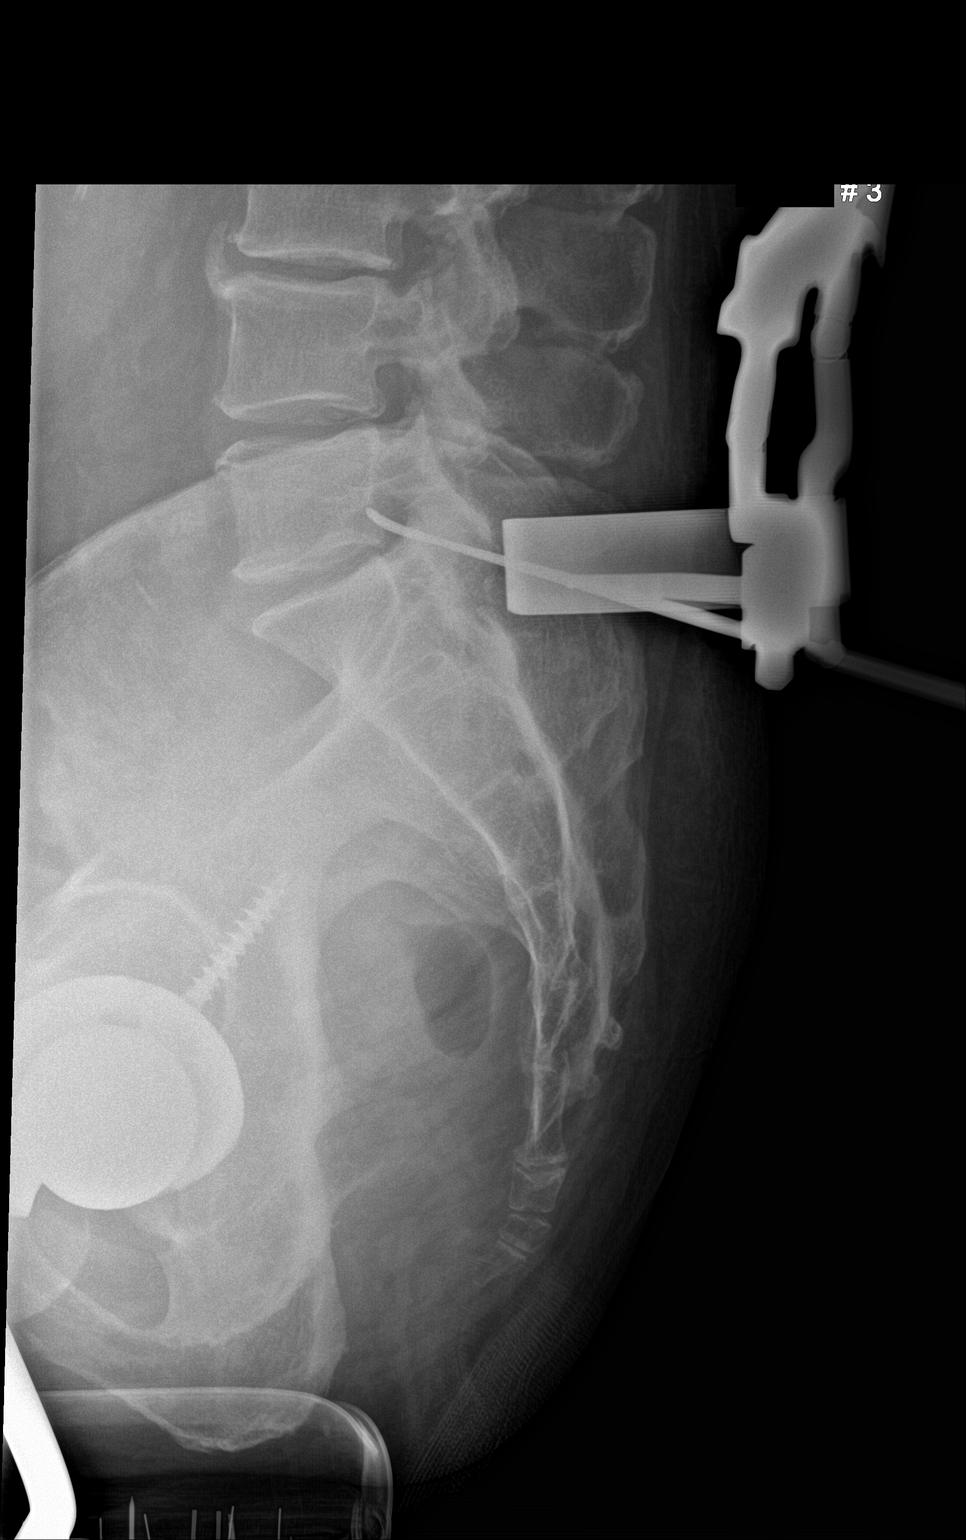

[1 of 1 positions shown; findings below may reference images not displayed]

FINDINGS: Intraoperative portable image 3 demonstrates a probe directed most
closely toward the inferior aspect of the L5 vertebrae, overlying
the L5-S1 foramen, just superior to the L5-S1 interspace.
IMPRESSION: Inferior aspect of L5 marked with a probe.

## 2018-07-07 IMAGING — DX DG SPINE 1V PORT
1 series · 1 of 1 positions shown · non-contrast
Comparison: Lumbar spine AP and lateral views January 29, 2017

CLINICAL DATA: Initial preoperative lateral portable radiograph
prior to L5-S1 procedure.

EXAM:
PORTABLE SPINE - 1 VIEW

[l-spine lat]
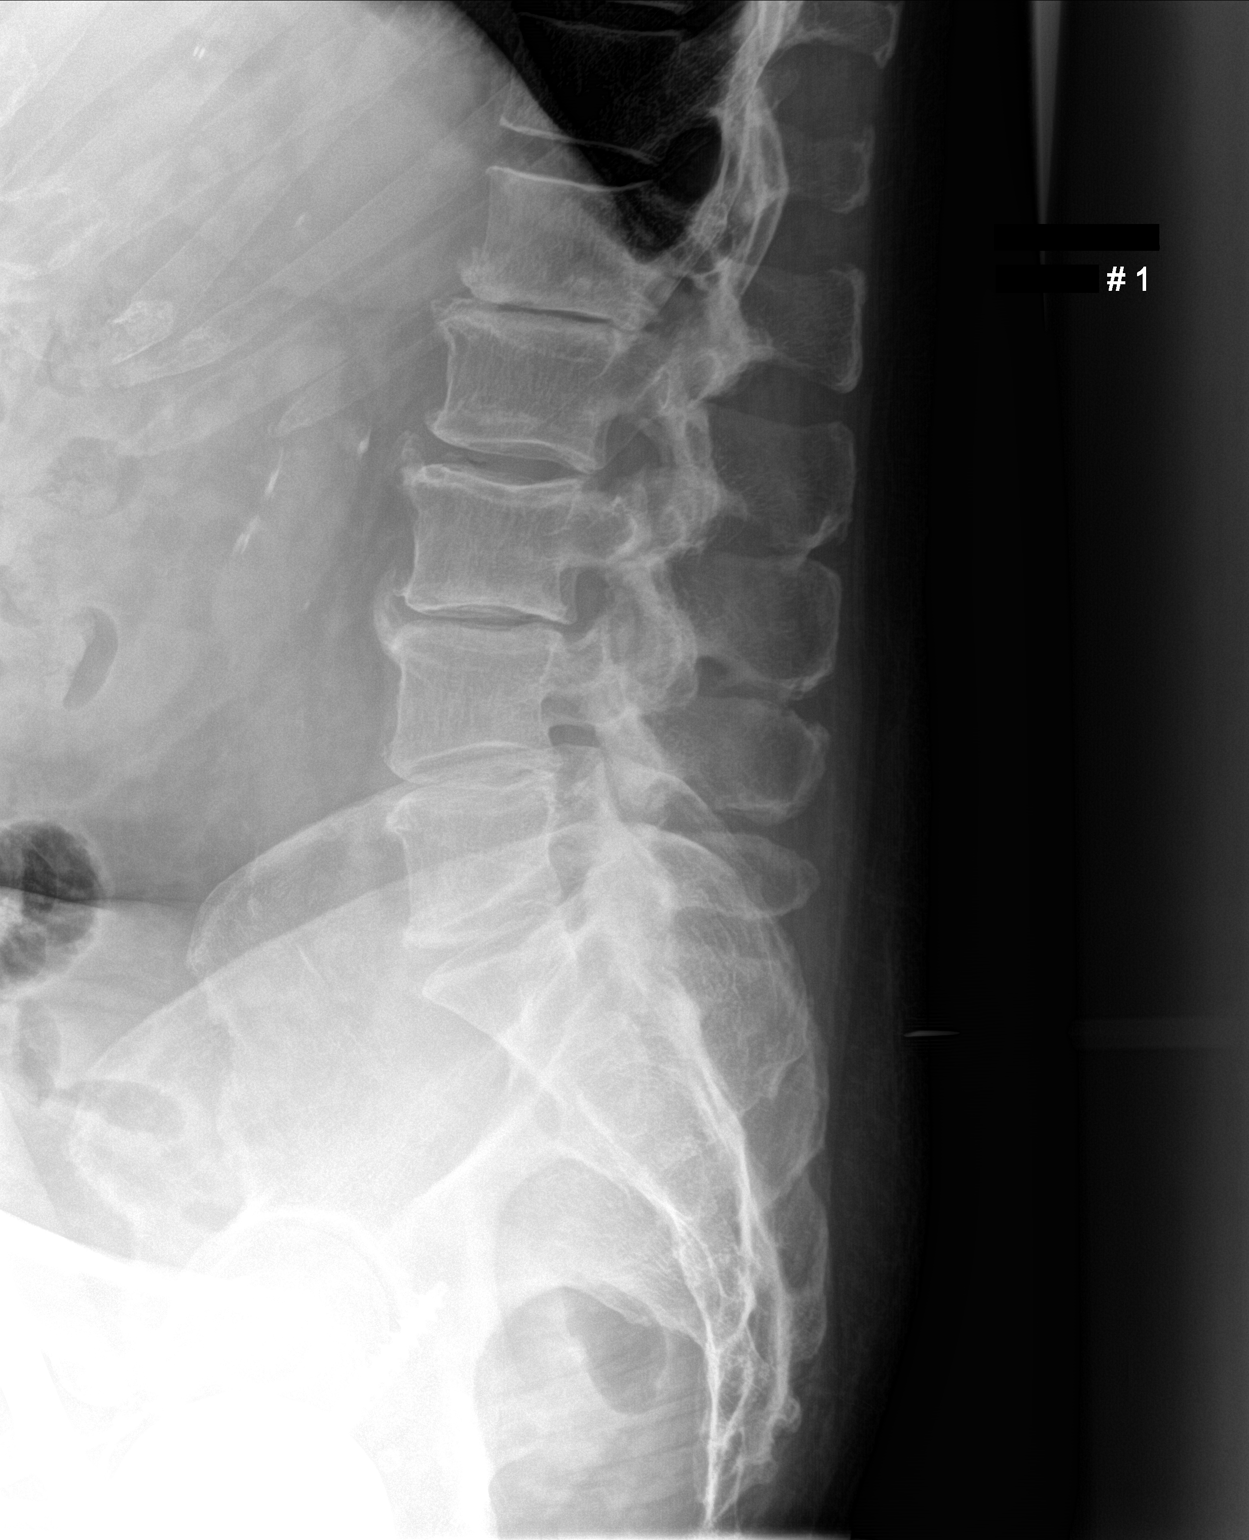

[1 of 1 positions shown; findings below may reference images not displayed]

FINDINGS: There are 5 lumbar type vertebral bodies present. There is moderate
disc space narrowing at L1-2 and L3-4 with milder narrowing at L2-3,
L4-5, and L5-S1. There is no spondylolisthesis. Anterior near
bridging osteophytes are present at L2-3 and L3-4.
IMPRESSION: Multilevel degenerative disc disease of the lumbar spine. The
spinous processes were numbered for purposes of surgical planning.

## 2018-07-09 NOTE — H&P (Signed)
TOTAL HIP REVISION ADMISSION H&P  Patient is admitted for right acetabular revision with possible constrained liner  Subjective:  Chief Complaint: right hip pain  HPI: Lance Hill is a 73 year old male who presents for pre-operative visit in preparation for their right acetabular revision with constrained liner, which is scheduled on 07/30/2018 with Dr. Lequita Halt at [facility]. The patient has had symptoms in the right hip including instability and recurrent dislocation which has impacted their quality of life and ability to do activities of daily living. The patient currently has a diagnosis of recurrent dislocation right total hip arthroplasty and has failed conservative treatments including activity modification, use of assistive devices and pain management. The patient has had previous THA revision with gluteal tendon repair on the right hip. Original surgery was done in 2016 in [place]. The patient denies an active infection.  Patient Active Problem List   Diagnosis Date Noted  . Failed total hip arthroplasty, subsequent encounter 06/26/2017  . Failed total hip arthroplasty, sequela 06/26/2017  . Spinal stenosis of lumbar region 02/06/2017  . Spinal stenosis, lumbar 02/06/2017   Past Medical History:  Diagnosis Date  . Arthritis   . Diabetes mellitus without complication (HCC)    type II   . GERD (gastroesophageal reflux disease)   . History of kidney stones    hx of   . Hypertension     Past Surgical History:  Procedure Laterality Date  . BACK SURGERY    . CHOLECYSTECTOMY    . EYE SURGERY     right eye surgery , bilateral cataract   . JOINT REPLACEMENT     hip and knee replacements   . LUMBAR LAMINECTOMY/DECOMPRESSION MICRODISCECTOMY Right 02/06/2017   Procedure: Microlumbar decompression L5-S1 right;  Surgeon: Jene Every, MD;  Location: [facility];  Service: Orthopedics;  Laterality: Right;  90 mins  . TONSILLECTOMY    . TOTAL HIP REVISION Right 06/26/2017   Procedure: Right hip femoral head revision and abductor tendon repair;  Surgeon: Ollen Gross, MD;  Location: [facility];  Service: Orthopedics;  Laterality: Right;    No current facility-administered medications for this encounter.    Current Outpatient Medications  Medication Sig Dispense Refill Last Dose  . acetaminophen (TYLENOL) 325 MG tablet Take 650 mg by mouth every 6 (six) hours as needed for moderate pain.   05/05/2018 at Unknown time  . amLODipine (NORVASC) 10 MG tablet Take 10 mg by mouth at bedtime.    05/05/2018 at Unknown time  . ARTIFICIAL TEAR OP Place 1 drop into both eyes daily as needed (for dry eyes).   Past Week at Unknown time  . aspirin EC 325 MG EC tablet Take 1 tablet (325 mg total) by mouth daily with breakfast. Take a full dose enteric coated 325 mg Aspirin daily for three weeks.   Once the patient has completed the three weeks of full dose Aspirin, then they may reduce back to the 81 mg Aspirin daily at home. (Patient not taking: Reported on 05/06/2018) 21 tablet 0 Not Taking at Unknown time  . aspirin EC 81 MG tablet Take 81 mg by mouth at bedtime.   05/05/2018 at Unknown time  . Brinzolamide-Brimonidine 1-0.2 % SUSP Place 1 drop into both eyes 3 (three) times daily.   05/06/2018 at Unknown time  . Cholecalciferol ([employer] VITAMIN D3) 1000 units capsule Take 2,000 Units by mouth daily.   05/06/2018 at Unknown time  . docusate sodium (COLACE) 100 MG capsule Take 1 capsule (100  mg total) by mouth 2 (two) times daily as needed for mild constipation. (Patient not taking: Reported on 05/06/2018) 30 capsule 1 Not Taking at Unknown time  . gabapentin (NEURONTIN) 300 MG capsule Take 300 mg by mouth 2 (two) times daily.   05/06/2018 at Unknown time  . glyBURIDE (DIABETA) 5 MG tablet Take 5 mg by mouth 2 (two) times daily with a meal.   05/06/2018 at Unknown time  . hydrochlorothiazide (HYDRODIURIL) 25 MG tablet Take 12.5 mg by mouth daily.   05/06/2018 at Unknown time  . HYDROcodone-acetaminophen  (NORCO/VICODIN) 5-325 MG tablet Take 1-2 tablets by mouth every 4 (four) hours as needed for moderate pain (pain score 4-6). (Patient not taking: Reported on 05/06/2018) 60 tablet 0 Not Taking at Unknown time  . loratadine (CLARITIN) 10 MG tablet Take 10 mg by mouth daily.   05/06/2018 at Unknown time  . losartan (COZAAR) 100 MG tablet Take 100 mg by mouth daily.   05/06/2018 at Unknown time  . magnesium oxide (MAG-OX) 400 MG tablet Take 400 mg by mouth daily.   05/06/2018 at Unknown time  . Menthol, Topical Analgesic, (BIOFREEZE) 4 % GEL Apply 1 application topically daily as needed (pain).   05/05/2018 at Unknown time  . metFORMIN (GLUCOPHAGE) 500 MG tablet Take 1,000 mg by mouth 2 (two) times daily with a meal.    05/06/2018 at Unknown time  . methocarbamol (ROBAXIN) 500 MG tablet Take 1 tablet (500 mg total) by mouth every 6 (six) hours as needed for muscle spasms. (Patient not taking: Reported on 05/06/2018) 60 tablet 0 Not Taking at Unknown time  . Multiple Vitamin (MULTIVITAMIN WITH MINERALS) TABS tablet Take 1 tablet by mouth daily.   05/06/2018 at Unknown time  . Omega-3 Fatty Acids (FISH OIL) 1200 MG CAPS Take 1,200 mg by mouth at bedtime.   05/06/2018 at Unknown time  . OVER THE COUNTER MEDICATION Place 1 Dose under the tongue daily. Hemp Flower Extract 500 mg drops. 1/2 dropperful daily   05/06/2018 at Unknown time  . pioglitazone (ACTOS) 45 MG tablet Take 45 mg by mouth daily.   05/06/2018 at Unknown time  . polyethylene glycol (MIRALAX / GLYCOLAX) packet Take 17 g by mouth daily. (Patient not taking: Reported on 05/06/2018) 14 each 0 Not Taking at Unknown time  . ranitidine (ZANTAC) 300 MG tablet Take 300 mg by mouth 2 (two) times daily.    05/06/2018 at Unknown time  . simvastatin (ZOCOR) 20 MG tablet Take 20 mg by mouth at bedtime.    05/05/2018 at Unknown time  . traMADol (ULTRAM) 50 MG tablet Take 1-2 tablets (50-100 mg total) by mouth every 6 (six) hours as needed (mild pain). 56 tablet 0 Past Week at Unknown  time  . trolamine salicylate (ASPERCREME) 10 % cream Apply 1 application topically as needed for muscle pain.   05/05/2018 at Unknown time   No Known Allergies  Social History   Tobacco Use  . Smoking status: Never Smoker  . Smokeless tobacco: Never Used  Substance Use Topics  . Alcohol use: Yes    Comment: glass wine once a month    No family history on file.   Review of Systems  Constitutional: Negative for chills and fever.  HENT: Negative for congestion, sore throat and tinnitus.   Eyes: Negative for double vision, photophobia and pain.  Respiratory: Negative for cough, shortness of breath and wheezing.   Cardiovascular: Negative for chest pain, palpitations and orthopnea.  Gastrointestinal: Negative for  heartburn, nausea and vomiting.  Genitourinary: Negative for dysuria, frequency and urgency.  Musculoskeletal: Positive for joint pain.  Neurological: Negative for dizziness, weakness and headaches.    Objective:  Physical Exam  Well nourished and well developed.  General: Alert and oriented x3, cooperative and pleasant, no acute distress.  Head: normocephalic, atraumatic, neck supple.  Eyes: EOMI.  Respiratory: breath sounds clear in all fields, no wheezing, rales, or rhonchi. Cardiovascular: Regular rate and rhythm, no murmurs, gallops or rubs.  Abdomen: non-tender to palpation and soft, normoactive bowel sounds. Musculoskeletal:  Right Hip Exam: Well healed arthroplasty scar.  No swelling.  ROM: Flexion to 110, Internal Rotation 20, External Rotation 30, and abduction 30 with moderate discomfort.  There is no tenderness over the greater trochanter bursa.   Calves soft and nontender. Motor function intact in LE. Strength 5/5 LE bilaterally. Neuro: Distal pulses 2+. Sensation to light touch intact in LE.  Vital signs in last 24 hours: Blood pressure: 130/78 mmHg Pulse: 72 bpm  Labs:   Estimated body mass index is 29.65 kg/m as calculated from the following:    Height as of 05/06/18: 5\' 8"  (1.727 m).   Weight as of 05/06/18: 88.5 kg.   Imaging Review Plain radiographs demonstrate no loosening of the prosthesis. The cup appears to be minimally retroverted.      Assessment/Plan:  Recurrent dislocation, right THA  The treatment options including medical management, injection therapy, arthroscopy and arthroplasty were discussed at length. The risks and benefits of total hip arthroplasty were presented and reviewed. The risks due to aseptic loosening, infection, stiffness, dislocation/subluxation,  thromboembolic complications and other imponderables were discussed.  The patient acknowledged the explanation, agreed to proceed with the plan and consent was signed. Patient is being admitted for inpatient treatment for surgery, pain control, PT, OT, prophylactic antibiotics, VTE prophylaxis, progressive ambulation and ADL's and discharge planning.The patient is planning to be discharged home.  Therapy Plans: HEP versus HHPT Disposition: Home with wife Planned DVT Prophylaxis: Aspirin 325 mg BID DME needed: None PCP: Andee Lineman, DO TXA: IV Allergies: NKDA Anesthesia Concerns: None BMI: 28.9 Last HgbA1c: 6.7%   - Patient was instructed on what medications to stop prior to surgery. - Follow-up visit in 2 weeks with Dr. Lequita Halt - Begin physical therapy following surgery - Pre-operative lab work as pre-surgical testing - Prescriptions will be provided in hospital at time of discharge  Arther Abbott, PA-C Orthopedic Surgery [facility] [place]

## 2018-07-30 ENCOUNTER — Inpatient Hospital Stay: Admit: 2018-07-30 | Payer: Medicare Other | Admitting: Orthopedic Surgery

## 2018-07-30 SURGERY — REVISION, TOTAL ARTHROPLASTY, HIP, ACETABULAR COMPONENT
Anesthesia: Choice | Laterality: Right

## 2018-08-14 ENCOUNTER — Emergency Department (HOSPITAL_COMMUNITY): Payer: Medicare Other

## 2018-08-14 ENCOUNTER — Emergency Department (HOSPITAL_COMMUNITY)
Admission: EM | Admit: 2018-08-14 | Discharge: 2018-08-15 | Disposition: A | Discharge status: Home / Self Care | Payer: Medicare Other | Attending: Emergency Medicine | Admitting: Emergency Medicine

## 2018-08-14 DIAGNOSIS — E119 Type 2 diabetes mellitus without complications: Secondary | ICD-10-CM | POA: Insufficient documentation

## 2018-08-14 DIAGNOSIS — Z7982 Long term (current) use of aspirin: Secondary | ICD-10-CM | POA: Insufficient documentation

## 2018-08-14 DIAGNOSIS — T84020D Dislocation of internal right hip prosthesis, subsequent encounter: Secondary | ICD-10-CM | POA: Diagnosis not present

## 2018-08-14 DIAGNOSIS — Y92009 Unspecified place in unspecified non-institutional (private) residence as the place of occurrence of the external cause: Secondary | ICD-10-CM | POA: Insufficient documentation

## 2018-08-14 DIAGNOSIS — Z96659 Presence of unspecified artificial knee joint: Secondary | ICD-10-CM | POA: Insufficient documentation

## 2018-08-14 DIAGNOSIS — R5381 Other malaise: Secondary | ICD-10-CM | POA: Diagnosis not present

## 2018-08-14 DIAGNOSIS — M25561 Pain in right knee: Secondary | ICD-10-CM | POA: Insufficient documentation

## 2018-08-14 DIAGNOSIS — S79911A Unspecified injury of right hip, initial encounter: Secondary | ICD-10-CM | POA: Diagnosis present

## 2018-08-14 DIAGNOSIS — T84020A Dislocation of internal right hip prosthesis, initial encounter: Secondary | ICD-10-CM | POA: Diagnosis not present

## 2018-08-14 DIAGNOSIS — I10 Essential (primary) hypertension: Secondary | ICD-10-CM | POA: Diagnosis not present

## 2018-08-14 DIAGNOSIS — X509XXA Other and unspecified overexertion or strenuous movements or postures, initial encounter: Secondary | ICD-10-CM | POA: Insufficient documentation

## 2018-08-14 DIAGNOSIS — Z96641 Presence of right artificial hip joint: Secondary | ICD-10-CM | POA: Diagnosis not present

## 2018-08-14 DIAGNOSIS — R52 Pain, unspecified: Secondary | ICD-10-CM | POA: Diagnosis not present

## 2018-08-14 DIAGNOSIS — Y9389 Activity, other specified: Secondary | ICD-10-CM | POA: Insufficient documentation

## 2018-08-14 DIAGNOSIS — Y999 Unspecified external cause status: Secondary | ICD-10-CM | POA: Insufficient documentation

## 2018-08-14 DIAGNOSIS — S73004A Unspecified dislocation of right hip, initial encounter: Secondary | ICD-10-CM

## 2018-08-14 DIAGNOSIS — Z79899 Other long term (current) drug therapy: Secondary | ICD-10-CM | POA: Insufficient documentation

## 2018-08-14 DIAGNOSIS — Z7984 Long term (current) use of oral hypoglycemic drugs: Secondary | ICD-10-CM | POA: Diagnosis not present

## 2018-08-14 MED ORDER — FENTANYL CITRATE (PF) 100 MCG/2ML IJ SOLN
75.0000 ug | Freq: Once | INTRAMUSCULAR | Status: AC
  Administered 2018-08-14: 75 ug via INTRAVENOUS
  Filled 2018-08-14: qty 2

## 2018-08-14 MED ORDER — PROPOFOL 10 MG/ML IV BOLUS
1.0000 mg/kg | Freq: Once | INTRAVENOUS | Status: AC
  Administered 2018-08-15: 130 mg via INTRAVENOUS
  Filled 2018-08-14: qty 20

## 2018-08-14 MED ORDER — ONDANSETRON HCL 4 MG/2ML IJ SOLN
4.0000 mg | Freq: Once | INTRAMUSCULAR | Status: AC
  Administered 2018-08-14: 4 mg via INTRAVENOUS
  Filled 2018-08-14: qty 2

## 2018-08-14 MED ORDER — SODIUM CHLORIDE 0.9 % IV BOLUS (SEPSIS)
1000.0000 mL | Freq: Once | INTRAVENOUS | Status: AC
  Administered 2018-08-14: 1000 mL via INTRAVENOUS

## 2018-08-14 MED ORDER — FENTANYL CITRATE (PF) 100 MCG/2ML IJ SOLN
50.0000 ug | Freq: Once | INTRAMUSCULAR | Status: AC
  Administered 2018-08-14: 50 ug via INTRAVENOUS
  Filled 2018-08-14: qty 2

## 2018-08-14 NOTE — ED Triage Notes (Signed)
Pt arriving via [facility] from home with right hip dislocation. Pt states he was bent over putting Biofreeze on his knee and felt his hip pop out.

## 2018-08-14 NOTE — ED Provider Notes (Signed)
Medical screening examination/treatment/procedure(s) were conducted as a shared visit with non-physician practitioner(s) and myself.  I personally evaluated the patient during the encounter.  None   Patient is a 73 year old male with history of hypertension, diabetes and recurrent right hip dislocations who presents today with a right hip dislocation.  Has previously had right total hip arthroplasty in 2016.  Followed by Dr. Despina Hick with plans for revision April 1 which was canceled secondary to COVID-19 pandemic.  States hip dislocated when bending over tonight.  Neurovascularly intact distally.  Hip reduced after patient being sedated with propofol and given fentanyl for analgesia.  Placed in knee immobilizer and instructed to follow-up with orthopedic as an outpatient.  Neurovascularly intact distally after reduction.   .Sedation Date/Time: 08/14/2018 11:44 PM Performed by: Ward, Layla Maw, DO Authorized by: Ward, Layla Maw, DO   Consent:    Consent obtained:  Written   Consent given by:  Patient   Risks discussed:  Allergic reaction, dysrhythmia, inadequate sedation, nausea, prolonged hypoxia resulting in organ damage, prolonged sedation necessitating reversal, respiratory compromise necessitating ventilatory assistance and intubation and vomiting   Alternatives discussed:  Analgesia without sedation, anxiolysis and regional anesthesia Universal protocol:    Procedure explained and questions answered to patient or proxy's satisfaction: yes     Relevant documents present and verified: yes     Test results available and properly labeled: yes     Imaging studies available: yes     Required blood products, implants, devices, and special equipment available: yes     Site/side marked: yes     Immediately prior to procedure a time out was called: yes     Patient identity confirmation method:  Verbally with patient Indications:    Procedure necessitating sedation performed by:  Physician  performing sedation Pre-sedation assessment:    Time since last food or drink:  8pm   ASA classification: class 2 - patient with mild systemic disease     Neck mobility: normal     Mouth opening:  3 or more finger widths   Thyromental distance:  3 finger widths   Mallampati score:  II - soft palate, uvula, fauces visible   Pre-sedation assessments completed and reviewed: airway patency, cardiovascular function, hydration status, mental status, nausea/vomiting, pain level, respiratory function and temperature     Pre-sedation assessment completed:  08/14/2018 11:45 PM Immediate pre-procedure details:    Reassessment: Patient reassessed immediately prior to procedure     Reviewed: vital signs, relevant labs/tests and NPO status     Verified: bag valve mask available, emergency equipment available, intubation equipment available, IV patency confirmed, oxygen available and suction available   Procedure details (see MAR for exact dosages):    Preoxygenation:  Nasal cannula   Sedation:  Propofol   Analgesia:  Fentanyl   Intra-procedure monitoring:  Blood pressure monitoring, cardiac monitor, continuous pulse oximetry, frequent LOC assessments, frequent vital sign checks and continuous capnometry   Intra-procedure events: none     Intra-procedure management:  Airway repositioning and supplemental oxygen   Total Provider sedation time (minutes):  15 Post-procedure details:    Post-sedation assessment completed:  08/15/2018 12:37 AM   Attendance: Constant attendance by certified staff until patient recovered     Recovery: Patient returned to pre-procedure baseline     Post-sedation assessments completed and reviewed: airway patency, cardiovascular function, hydration status, mental status, nausea/vomiting, pain level, respiratory function and temperature     Patient is stable for discharge or admission: yes  Patient tolerance:  Tolerated well, no immediate complications Comments:     Patient  required several doses of propofol for adequate sedation.  Did briefly become apneic and required increasing supplemental oxygen briefly. Reduction of dislocation Date/Time: 08/15/2018 12:38 AM Performed by: Ward, Layla MawKristen N, DO Authorized by: Ward, Layla MawKristen N, DO  Consent: Written consent obtained. Risks and benefits: risks, benefits and alternatives were discussed Consent given by: patient Patient understanding: patient states understanding of the procedure being performed Patient consent: the patient's understanding of the procedure matches consent given Procedure consent: procedure consent matches procedure scheduled Relevant documents: relevant documents present and verified Test results: test results available and properly labeled Site marked: the operative site was marked Imaging studies: imaging studies available Required items: required blood products, implants, devices, and special equipment available Patient identity confirmed: verbally with patient Time out: Immediately prior to procedure a "time out" was called to verify the correct patient, procedure, equipment, support staff and site/side marked as required. Local anesthesia used: no  Anesthesia: Local anesthesia used: no  Sedation: Patient sedated: yes Sedation type: moderate (conscious) sedation Sedatives: propofol Analgesia: fentanyl Sedation start date/time: 08/15/2018 12:05 PM Sedation end date/time: 08/15/2018 12:14 PM Vitals: Vital signs were monitored during sedation.  Patient tolerance: Patient tolerated the procedure well with no immediate complications Comments: Reduced with traction, hip and knee flexion and back and forth IR and ER.       Ward, Layla MawKristen N, DO 08/15/18 [phone]

## 2018-08-14 NOTE — ED Provider Notes (Signed)
[facility]-EMERGENCY DEPT Provider Note   CSN: [id number] Arrival date & time: 08/14/18  2236    History   Chief Complaint Chief Complaint  Patient presents with  . Hip Pain    HPI Lance Hill is a 73 y.o. 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  The patient reports that he was sitting in a chair applying Biofreeze to his right knee when he felt a pop in his right hip.  He reports sudden onset, severity of pain to the right hip.  He has a history of recurrent right hip dislocations and reports the current pain feels similar.  He denies numbness, weakness, fever, chills, or overlying redness, warmth, or swelling to the joint, or back pain.  He was scheduled for a right hip revision with Dr. Antony Odea on July 30, 2018, but the procedure was canceled due to the COVID-19 pandemic.  No treatment prior to arrival.     The history is provided by the patient. No language interpreter was used.    Past Medical History:  Diagnosis Date  . Arthritis   . Diabetes mellitus without complication (HCC)    type II   . GERD (gastroesophageal reflux disease)   . History of kidney stones    hx of   . Hypertension     Patient Active Problem List   Diagnosis Date Noted  . Failed total hip arthroplasty, subsequent encounter 06/26/2017  . Failed total hip arthroplasty, sequela 06/26/2017  . Spinal stenosis of lumbar region 02/06/2017  . Spinal stenosis, lumbar 02/06/2017    Past Surgical History:  Procedure Laterality Date  . BACK SURGERY    . CHOLECYSTECTOMY    . EYE SURGERY     right eye surgery , bilateral cataract   . JOINT REPLACEMENT     hip and knee replacements   . LUMBAR LAMINECTOMY/DECOMPRESSION MICRODISCECTOMY Right 02/06/2017   Procedure: Microlumbar decompression L5-S1 right;   Surgeon: Jene Every, MD;  Location: [facility];  Service: Orthopedics;  Laterality: Right;  90 mins  . TONSILLECTOMY    . TOTAL HIP REVISION Right 06/26/2017   Procedure: Right hip femoral head revision and abductor tendon repair;  Surgeon: Ollen Gross, MD;  Location: [facility];  Service: Orthopedics;  Laterality: Right;        Home Medications    Prior to Admission medications   Medication Sig Start Date End Date Taking? Authorizing Provider  acetaminophen (TYLENOL) 325 MG tablet Take 650 mg by mouth every 6 (six) hours as needed for moderate pain.    [provider]  amLODipine (NORVASC) 10 MG tablet Take 10 mg by mouth at bedtime.     [provider]  ARTIFICIAL TEAR OP Place 1 drop into both eyes daily as needed (for dry eyes).    [provider]  aspirin EC 325 MG EC tablet Take 1 tablet (325 mg total) by mouth daily with breakfast. Take a full dose enteric coated 325 mg Aspirin daily for three weeks.   Once the patient has completed the three weeks of full dose Aspirin, then they may reduce back to the 81 mg Aspirin daily at home. Patient not taking: Reported on 05/06/2018 06/28/17   Julien Girt, Alexzandrew L, PA-C  aspirin EC 81 MG tablet Take 81 mg by mouth at bedtime.    [provider]  Brinzolamide-Brimonidine 1-0.2 % SUSP Place 1 drop into both eyes 3 (three) times daily.    [provider]  Cholecalciferol ([employer] VITAMIN D3) 1000 units capsule Take 2,000 Units by mouth daily.    [provider]  docusate sodium (COLACE) 100 MG capsule Take 1 capsule (100 mg total) by mouth 2 (two) times daily as needed for mild constipation. Patient not taking: Reported on 05/06/2018 02/06/17   Jene Every, MD  gabapentin (NEURONTIN) 300 MG capsule Take 300 mg by mouth 2 (two) times daily.    [provider]  glyBURIDE (DIABETA) 5 MG tablet Take 5 mg by mouth 2 (two) times daily with a meal.    [provider]  hydrochlorothiazide  (HYDRODIURIL) 25 MG tablet Take 12.5 mg by mouth daily.    [provider]  HYDROcodone-acetaminophen (NORCO/VICODIN) 5-325 MG tablet Take 1-2 tablets by mouth every 4 (four) hours as needed for moderate pain (pain score 4-6). Patient not taking: Reported on 05/06/2018 06/27/17   Julien Girt, Alexzandrew L, PA-C  loratadine (CLARITIN) 10 MG tablet Take 10 mg by mouth daily.    [provider]  losartan (COZAAR) 100 MG tablet Take 100 mg by mouth daily.    [provider]  magnesium oxide (MAG-OX) 400 MG tablet Take 400 mg by mouth daily.    [provider]  Menthol, Topical Analgesic, (BIOFREEZE) 4 % GEL Apply 1 application topically daily as needed (pain).    [provider]  metFORMIN (GLUCOPHAGE) 500 MG tablet Take 1,000 mg by mouth 2 (two) times daily with a meal.     [provider]  methocarbamol (ROBAXIN) 500 MG tablet Take 1 tablet (500 mg total) by mouth every 6 (six) hours as needed for muscle spasms. Patient not taking: Reported on 05/06/2018 06/27/17   Julien Girt, Alexzandrew L, PA-C  Multiple Vitamin (MULTIVITAMIN WITH MINERALS) TABS tablet Take 1 tablet by mouth daily.    [provider]  Omega-3 Fatty Acids (FISH OIL) 1200 MG CAPS Take 1,200 mg by mouth at bedtime.    [provider]  OVER THE COUNTER MEDICATION Place 1 Dose under the tongue daily. Hemp Flower Extract 500 mg drops. 1/2 dropperful daily    [provider]  pioglitazone (ACTOS) 45 MG tablet Take 45 mg by mouth daily.    [provider]  polyethylene glycol (MIRALAX / GLYCOLAX) packet Take 17 g by mouth daily. Patient not taking: Reported on 05/06/2018 02/06/17   Jene Every, MD  ranitidine (ZANTAC) 300 MG tablet Take 300 mg by mouth 2 (two) times daily.     [provider]  simvastatin (ZOCOR) 20 MG tablet Take 20 mg by mouth at bedtime.     [provider]  traMADol (ULTRAM) 50 MG tablet Take 1-2 tablets (50-100 mg total)  by mouth every 6 (six) hours as needed (mild pain). 06/27/17   Perkins, Alexzandrew L, PA-C  trolamine salicylate (ASPERCREME) 10 % cream Apply 1 application topically as needed for muscle pain.    [provider]    Family History No family history on file.  Social History Social History   Tobacco Use  . Smoking status: Never Smoker  . Smokeless tobacco: Never Used  Substance Use Topics  . Alcohol use: Yes    Comment: glass wine once a month  . Drug use: No     Allergies   Patient has no known allergies.   Review of Systems Review of Systems  Constitutional: Negative for appetite change,  chills and fever.  Respiratory: Negative for shortness of breath.   Cardiovascular: Negative for chest pain.  Gastrointestinal: Negative for abdominal pain.  Genitourinary: Negative for dysuria.  Musculoskeletal: Positive for arthralgias, gait problem, joint swelling and myalgias. Negative for back pain, neck pain and neck stiffness.  Skin: Negative for color change, rash and wound.  Allergic/Immunologic: Negative for immunocompromised state.  Neurological: Negative for weakness, numbness and headaches.  Psychiatric/Behavioral: Negative for confusion.     Physical Exam Updated Vital Signs BP 118/65   Pulse 91   Temp 98.4 F (36.9 C) (Oral)   Resp 11   Ht 5\' 8"  (1.727 m)   Wt 88.9 kg   SpO2 97%   BMI 29.80 kg/m   Physical Exam Vitals signs and nursing note reviewed.  Constitutional:      General: He is not in acute distress.    Appearance: He is well-developed. He is not ill-appearing, toxic-appearing or diaphoretic.  HENT:     Head: Normocephalic.  Eyes:     Conjunctiva/sclera: Conjunctivae normal.  Neck:     Musculoskeletal: Neck supple.  Cardiovascular:     Rate and Rhythm: Normal rate and regular rhythm.     Heart sounds: No murmur.  Pulmonary:     Effort: Pulmonary effort is normal. No respiratory distress.     Breath sounds: No stridor. No wheezing,  rhonchi or rales.  Chest:     Chest wall: No tenderness.  Abdominal:     General: There is no distension.     Palpations: Abdomen is soft.  Musculoskeletal:        General: Tenderness and deformity present. No swelling or signs of injury.     Right lower leg: No edema.     Left lower leg: No edema.     Comments: Obvious deformity. TTP to the right hip. Right knee is non-tender. Exam of the right knee is deferred secondary to pain. Shortening of the right leg. DP and PT pulses are 2+ and symmetric. Sensation to the bilateral lower extremities is intact and equal. Good strength against resistant with dorsiflexion and plantarflexion.   Skin:    General: Skin is warm and dry.  Neurological:     Mental Status: He is alert.  Psychiatric:        Behavior: Behavior normal.      ED Treatments / Results  Labs (all labs ordered are listed, but only abnormal results are displayed) Labs Reviewed - No data to display  EKG None  Radiology Dg Hip Port Unilat W Or Wo Pelvis 1 View Right  Result Date: 08/15/2018 CLINICAL DATA:  73 y/o  M; reduction of hip dislocation. EXAM: DG HIP (WITH OR WITHOUT PELVIS) 1V PORT RIGHT COMPARISON:  08/14/2018 pelvis and right hip radiographs FINDINGS: Interval reduction of right hip prosthesis dislocation in good alignment on this single frontal view. No acute fracture identified. IMPRESSION: Interval reduction of right hip prosthesis in good alignment. Electronically Signed   By: Mitzi HansenLance  Furusawa-Stratton M.D.   On: 08/15/2018 00:51   Dg Hip Unilat W Or Wo Pelvis 2-3 Views Right  Result Date: 08/15/2018 CLINICAL DATA:  73 y/o M; right hip pain and right leg shortening. History of recurrent dislocations. EXAM: DG HIP (WITH OR WITHOUT PELVIS) 2-3V RIGHT COMPARISON:  05/06/2018 right lower extremity radiographs FINDINGS: Superior dislocation the right hip prosthesis. No acute fracture identified. Left hip joint is maintained. No pelvic fracture or diastasis is  evident. IMPRESSION: Superior dislocation of right hip prosthesis. No  acute fracture identified. Electronically Signed   By: Mitzi Hansen M.D.   On: 08/15/2018 00:12    Procedures Procedures (including critical care time)  Medications Ordered in ED Medications  fentaNYL (SUBLIMAZE) injection 75 mcg (75 mcg Intravenous Given 08/14/18 2310)  propofol (DIPRIVAN) 10 mg/mL bolus/IV push 88.9 mg (130 mg Intravenous Given 08/15/18 0009)  sodium chloride 0.9 % bolus 1,000 mL (0 mLs Intravenous Stopped 08/15/18 0020)  ondansetron (ZOFRAN) injection 4 mg (4 mg Intravenous Given 08/14/18 2346)  fentaNYL (SUBLIMAZE) injection 50 mcg (50 mcg Intravenous Given 08/14/18 2354)  propofol (DIPRIVAN) 10 mg/mL bolus/IV push (40 mg Intravenous Given 08/15/18 0009)     Initial Impression / Assessment and Plan / ED Course  I have reviewed the triage vital signs and the nursing notes.  Pertinent labs & imaging results that were available during my care of the patient were reviewed by me and considered in my medical decision making (see chart for details).        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  On exam, there is shortening of the right leg and obvious deformity of the right hip.  He is neurovascularly intact.  Fentanyl given for pain will check an x-ray of the right hip.  X-ray of the right hip with superior dislocation of right hip prosthesis without acute fracture identified.  Conscious sedation and reduction of right hip dislocation performed by Dr. Elesa Massed with my assistance.  Please see her procedure note.  Patient briefly became apneic and required supplemental oxygen. Postreduction films with reduction of right hip prosthesis in good alignment.  The patient was placed in an immobilizer and has been instructed to follow-up with Dr. Despina Hick with orthopedics.   He has tramadol at home for pain control.  Prior to discharge, the patient was awake, talking, voiding, and tolerating fluids without difficulty.  He is hemodynamically stable and in no acute distress.  Safe for discharge home with outpatient follow-up at this time.  Final Clinical Impressions(s) / ED Diagnoses   Final diagnoses:  Dislocation of right hip, initial encounter [facility])    ED Discharge Orders    None       See Beharry A, PA-C 08/15/18 0201    Ward, Layla Maw, DO 08/15/18 0981

## 2018-08-15 ENCOUNTER — Emergency Department (HOSPITAL_COMMUNITY): Payer: Medicare Other

## 2018-08-15 ENCOUNTER — Other Ambulatory Visit: Payer: Self-pay

## 2018-08-15 DIAGNOSIS — S73004A Unspecified dislocation of right hip, initial encounter: Secondary | ICD-10-CM | POA: Diagnosis not present

## 2018-08-15 DIAGNOSIS — T84020D Dislocation of internal right hip prosthesis, subsequent encounter: Secondary | ICD-10-CM | POA: Diagnosis not present

## 2018-08-15 MED ORDER — PROPOFOL 10 MG/ML IV BOLUS
INTRAVENOUS | Status: AC | PRN
  Administered 2018-08-15: 50 mg via INTRAVENOUS
  Administered 2018-08-15 (×2): 40 mg via INTRAVENOUS

## 2018-08-15 NOTE — Discharge Instructions (Signed)
Thank you for allowing me to care for you today in the Emergency Department.   Take your home tramadol as prescribed for pain.  Wear the knee immobilizer until you are seen by Dr. Antony Odea.  Please call his office tomorrow morning to schedule a follow-up appointment.  Return to the emergency department if you develop severe, uncontrollable pain in the leg, if you have another dislocation, develop new numbness or weakness, have any fall or injury, or other new, concerning symptoms.

## 2018-08-15 NOTE — Progress Notes (Signed)
Called to patient room for procedural sedation with hip reduction. Required some airway manipulation secondary to obstructive hypopnea. Supplemental oxygen titrated according to SpO2. Once awake, rapidly weaned to 2 liters. VS remained stable throughout the procedure.

## 2018-08-19 DIAGNOSIS — T84090D Other mechanical complication of internal right hip prosthesis, subsequent encounter: Secondary | ICD-10-CM | POA: Diagnosis not present

## 2018-08-19 DIAGNOSIS — Z96641 Presence of right artificial hip joint: Secondary | ICD-10-CM | POA: Diagnosis not present

## 2018-08-19 NOTE — Progress Notes (Signed)
Please place orders in Epic as patient is being scheduled for a pre-op appointment! Thank you! 

## 2018-08-19 NOTE — Progress Notes (Signed)
07-14-18 Clearance on chart from Dr. Gerlene Fee

## 2018-08-19 NOTE — Patient Instructions (Addendum)
Lance Hill  08/19/2018   Your procedure is scheduled on: 08-25-18    Report to [facility] Main  Entrance    Report to Admitting at 7:00 AM    Call this number if you have problems the morning of surgery [phone]     Remember: Do not eat food or drink liquids :After Midnight. NO SOLID FOOD AFTER MIDNIGHT THE NIGHT PRIOR TO SURGERY. NOTHING BY MOUTH EXCEPT CLEAR LIQUIDS UNTIL 3 HOURS PRIOR TO SCHEULED SURGERY. PLEASE FINISH ENSURE DRINK PER SURGEON ORDER  WHICH NEEDS TO BE COMPLETED AT 4:30 AM.   CLEAR LIQUID DIET   Foods Allowed                                                                     Foods Excluded  Coffee and tea, regular and decaf                             liquids that you cannot  Plain Jell-O in any flavor                                             see through such as: Fruit ices (not with fruit pulp)                                     milk, soups, orange juice  Iced Popsicles                                    All solid food Carbonated beverages, regular and diet                                    Cranberry, grape and apple juices Sports drinks like Gatorade Lightly seasoned clear broth or consume(fat free) Sugar, honey syrup  Sample Menu Breakfast                                Lunch                                     Supper Cranberry juice                    Beef broth                            Chicken broth Jell-O                                     Grape juice  Apple juice Coffee or tea                        Jell-O                                      Popsicle                                                Coffee or tea                        Coffee or tea  _____________________________________________________________________     BRUSH YOUR TEETH MORNING OF SURGERY AND RINSE YOUR MOUTH OUT, NO CHEWING GUM CANDY OR MINTS.     Take these medicines the morning of surgery with A SIP OF WATER:  Gabapentin (Neurontin), Loratadine (Claritin), and Ranitidine (Zantac)   DO NOT TAKE ANY DIABETIC MEDICATIONS DAY OF YOUR SURGERY                               You may not have any metal on your body including hair pins and              piercings  Do not wear jewelry, cologne, lotions, powders or deodorant           .              Men may shave face and neck.   Do not bring valuables to the hospital. [facility] IS NOT             RESPONSIBLE   FOR VALUABLES.  Contacts, dentures or bridgework may not be worn into surgery.  Leave suitcase in the car. After surgery it may be brought to your room.    Special Instructions: N/A              Please read over the following fact sheets you were given: _____________________________________________________________________  How to Manage Your Diabetes Before and After Surgery  Why is it important to control my blood sugar before and after surgery? . Improving blood sugar levels before and after surgery helps healing and can limit problems. . A way of improving blood sugar control is eating a healthy diet by: o  Eating less sugar and carbohydrates o  Increasing activity/exercise o  Talking with your doctor about reaching your blood sugar goals . High blood sugars (greater than 180 mg/dL) can raise your risk of infections and slow your recovery, so you will need to focus on controlling your diabetes during the weeks before surgery. . Make sure that the doctor who takes care of your diabetes knows about your planned surgery including the date and location.  How do I manage my blood sugar before surgery? . Check your blood sugar at least 4 times a day, starting 2 days before surgery, to make sure that the level is not too high or low. o Check your blood sugar the morning of your surgery when you wake up and every 2 hours until you get to the Short Stay unit. . If your blood sugar is less than 70 mg/dL, you will need to treat for low blood  sugar:  o Do not take insulin. o Treat a low blood sugar (less than 70 mg/dL) with  cup of clear juice (cranberry or apple), 4 glucose tablets, OR glucose gel. o Recheck blood sugar in 15 minutes after treatment (to make sure it is greater than 70 mg/dL). If your blood sugar is not greater than 70 mg/dL on recheck, call [phone] for further instructions. . Report your blood sugar to the short stay nurse when you get to Short Stay.  . If you are admitted to the hospital after surgery: o Your blood sugar will be checked by the staff and you will probably be given insulin after surgery (instead of oral diabetes medicines) to make sure you have good blood sugar levels. o The goal for blood sugar control after surgery is 80-180 mg/dL.   WHAT DO I DO ABOUT MY DIABETES MEDICATION?  Marland Kitchen. Do not take oral diabetes medicines (pills) the morning of surgery.  . THE DAY BEFORE SURGERY, take only your morning/lunch dose of Glyburide, and your usual dose of Metformin and Actos        Reviewed and Endorsed by [facility] Patient Education Committee, August 2015    [facility] - Preparing for Surgery Before surgery, you can play an important role.  Because skin is not sterile, your skin needs to be as free of germs as possible.  You can reduce the number of germs on your skin by washing with CHG (chlorahexidine gluconate) soap before surgery.  CHG is an antiseptic cleaner which kills germs and bonds with the skin to continue killing germs even after washing. Please DO NOT use if you have an allergy to CHG or antibacterial soaps.  If your skin becomes reddened/irritated stop using the CHG and inform your nurse when you arrive at Short Stay. Do not shave (including legs and underarms) for at least 48 hours prior to the first CHG shower.  You may shave your face/neck. Please follow these instructions carefully:  1.  Shower with CHG Soap the night before surgery and the  morning of Surgery.  2.  If you  choose to wash your hair, wash your hair first as usual with your  normal  shampoo.  3.  After you shampoo, rinse your hair and body thoroughly to remove the  shampoo.                           4.  Use CHG as you would any other liquid soap.  You can apply chg directly  to the skin and wash                       Gently with a scrungie or clean washcloth.  5.  Apply the CHG Soap to your body ONLY FROM THE NECK DOWN.   Do not use on face/ open                           Wound or open sores. Avoid contact with eyes, ears mouth and genitals (private parts).                       Wash face,  Genitals (private parts) with your normal soap.             6.  Wash thoroughly, paying special attention to the area where your surgery  will be performed.  7.  Thoroughly  rinse your body with warm water from the neck down.  8.  DO NOT shower/wash with your normal soap after using and rinsing off  the CHG Soap.                9.  Pat yourself dry with a clean towel.            10.  Wear clean pajamas.            11.  Place clean sheets on your bed the night of your first shower and do not  sleep with pets. Day of Surgery : Do not apply any lotions/deodorants the morning of surgery.  Please wear clean clothes to the hospital/surgery center.  FAILURE TO FOLLOW THESE INSTRUCTIONS MAY RESULT IN THE CANCELLATION OF YOUR SURGERY PATIENT SIGNATURE_________________________________  NURSE SIGNATURE__________________________________  ________________________________________________________________________   Rogelia Mire  An incentive spirometer is a tool that can help keep your lungs clear and active. This tool measures how well you are filling your lungs with each breath. Taking long deep breaths may help reverse or decrease the chance of developing breathing (pulmonary) problems (especially infection) following:  A long period of time when you are unable to move or be active. BEFORE THE PROCEDURE   If  the spirometer includes an indicator to show your best effort, your nurse or respiratory therapist will set it to a desired goal.  If possible, sit up straight or lean slightly forward. Try not to slouch.  Hold the incentive spirometer in an upright position. INSTRUCTIONS FOR USE  1. Sit on the edge of your bed if possible, or sit up as far as you can in bed or on a chair. 2. Hold the incentive spirometer in an upright position. 3. Breathe out normally. 4. Place the mouthpiece in your mouth and seal your lips tightly around it. 5. Breathe in slowly and as deeply as possible, raising the piston or the ball toward the top of the column. 6. Hold your breath for 3-5 seconds or for as long as possible. Allow the piston or ball to fall to the bottom of the column. 7. Remove the mouthpiece from your mouth and breathe out normally. 8. Rest for a few seconds and repeat Steps 1 through 7 at least 10 times every 1-2 hours when you are awake. Take your time and take a few normal breaths between deep breaths. 9. The spirometer may include an indicator to show your best effort. Use the indicator as a goal to work toward during each repetition. 10. After each set of 10 deep breaths, practice coughing to be sure your lungs are clear. If you have an incision (the cut made at the time of surgery), support your incision when coughing by placing a pillow or rolled up towels firmly against it. Once you are able to get out of bed, walk around indoors and cough well. You may stop using the incentive spirometer when instructed by your caregiver.  RISKS AND COMPLICATIONS  Take your time so you do not get dizzy or light-headed.  If you are in pain, you may need to take or ask for pain medication before doing incentive spirometry. It is harder to take a deep breath if you are having pain. AFTER USE  Rest and breathe slowly and easily.  It can be helpful to keep track of a log of your progress. Your caregiver can  provide you with a simple table to help with this. If you are using the spirometer  at home, follow these instructions: SEEK MEDICAL CARE IF:   You are having difficultly using the spirometer.  You have trouble using the spirometer as often as instructed.  Your pain medication is not giving enough relief while using the spirometer.  You develop fever of 100.5 F (38.1 C) or higher. SEEK IMMEDIATE MEDICAL CARE IF:   You cough up bloody sputum that had not been present before.  You develop fever of 102 F (38.9 C) or greater.  You develop worsening pain at or near the incision site. MAKE SURE YOU:   Understand these instructions.  Will watch your condition.  Will get help right away if you are not doing well or get worse. Document Released: 08/27/2006 Document Revised: 07/09/2011 Document Reviewed: 10/28/2006 ExitCare Patient Information 2014 ExitCare, [place].   ________________________________________________________________________  WHAT IS A BLOOD TRANSFUSION? Blood Transfusion Information  A transfusion is the replacement of blood or some of its parts. Blood is made up of multiple cells which provide different functions.  Red blood cells carry oxygen and are used for blood loss replacement.  White blood cells fight against infection.  Platelets control bleeding.  Plasma helps clot blood.  Other blood products are available for specialized needs, such as hemophilia or other clotting disorders. BEFORE THE TRANSFUSION  Who gives blood for transfusions?   Healthy volunteers who are fully evaluated to make sure their blood is safe. This is blood bank blood. Transfusion therapy is the safest it has ever been in the practice of medicine. Before blood is taken from a donor, a complete history is taken to make sure that person has no history of diseases nor engages in risky social behavior (examples are intravenous drug use or sexual activity with multiple partners). The  donor's travel history is screened to minimize risk of transmitting infections, such as malaria. The donated blood is tested for signs of infectious diseases, such as HIV and hepatitis. The blood is then tested to be sure it is compatible with you in order to minimize the chance of a transfusion reaction. If you or a relative donates blood, this is often done in anticipation of surgery and is not appropriate for emergency situations. It takes many days to process the donated blood. RISKS AND COMPLICATIONS Although transfusion therapy is very safe and saves many lives, the main dangers of transfusion include:   Getting an infectious disease.  Developing a transfusion reaction. This is an allergic reaction to something in the blood you were given. Every precaution is taken to prevent this. The decision to have a blood transfusion has been considered carefully by your caregiver before blood is given. Blood is not given unless the benefits outweigh the risks. AFTER THE TRANSFUSION  Right after receiving a blood transfusion, you will usually feel much better and more energetic. This is especially true if your red blood cells have gotten low (anemic). The transfusion raises the level of the red blood cells which carry oxygen, and this usually causes an energy increase.  The nurse administering the transfusion will monitor you carefully for complications. HOME CARE INSTRUCTIONS  No special instructions are needed after a transfusion. You may find your energy is better. Speak with your caregiver about any limitations on activity for underlying diseases you may have. SEEK MEDICAL CARE IF:   Your condition is not improving after your transfusion.  You develop redness or irritation at the intravenous (IV) site. SEEK IMMEDIATE MEDICAL CARE IF:  Any of the following symptoms occur over the next  12 hours:  Shaking chills.  You have a temperature by mouth above 102 F (38.9 C), not controlled by  medicine.  Chest, back, or muscle pain.  People around you feel you are not acting correctly or are confused.  Shortness of breath or difficulty breathing.  Dizziness and fainting.  You get a rash or develop hives.  You have a decrease in urine output.  Your urine turns a dark color or changes to pink, red, or brown. Any of the following symptoms occur over the next 10 days:  You have a temperature by mouth above 102 F (38.9 C), not controlled by medicine.  Shortness of breath.  Weakness after normal activity.  The white part of the eye turns yellow (jaundice).  You have a decrease in the amount of urine or are urinating less often.  Your urine turns a dark color or changes to pink, red, or brown. Document Released: 04/13/2000 Document Revised: 07/09/2011 Document Reviewed: 12/01/2007 [facility] Patient Information 2014 [place], [place].  _______________________________________________________________________

## 2018-08-20 NOTE — Progress Notes (Signed)
SPOKE W/  Rama     SCREENING SYMPTOMS OF COVID 19:   COUGH--YES (SEASONAL ALLERGIES)  RUNNY NOSE--- YES  (SEASONAL ALLERGIES)  SORE THROAT---YES  (SEASONAL ALLERGIES)  NASAL CONGESTION----NO  SNEEZING----NO  SHORTNESS OF BREATH---NO  DIFFICULTY BREATHING---NO  TEMP >100.4-----NO  UNEXPLAINED BODY ACHES------NO   HAVE YOU OR ANY FAMILY MEMBER TRAVELLED PAST 14 DAYS OUT OF THE   COUNTY---[place] TO [place] FOR DRS. APPT. STATE----NO COUNTRY----NO  HAVE YOU OR ANY FAMILY MEMBER BEEN EXPOSED TO ANYONE WITH COVID 19? NO

## 2018-08-21 ENCOUNTER — Encounter (HOSPITAL_COMMUNITY)
Admission: RE | Admit: 2018-08-21 | Discharge: 2018-08-21 | Disposition: A | Discharge status: Home / Self Care | Payer: Medicare Other | Source: Ambulatory Visit | Attending: Orthopedic Surgery | Admitting: Orthopedic Surgery

## 2018-08-21 ENCOUNTER — Encounter (HOSPITAL_COMMUNITY): Payer: Self-pay

## 2018-08-21 ENCOUNTER — Other Ambulatory Visit: Payer: Self-pay

## 2018-08-21 DIAGNOSIS — I10 Essential (primary) hypertension: Secondary | ICD-10-CM | POA: Diagnosis not present

## 2018-08-21 DIAGNOSIS — Z01818 Encounter for other preprocedural examination: Secondary | ICD-10-CM | POA: Diagnosis not present

## 2018-08-21 LAB — COMPREHENSIVE METABOLIC PANEL
ALT: 24 U/L (ref 0–44)
AST: 22 U/L (ref 15–41)
Albumin: 4.3 g/dL (ref 3.5–5.0)
Alkaline Phosphatase: 55 U/L (ref 38–126)
Anion gap: 7 (ref 5–15)
BUN: 40 mg/dL — ABNORMAL HIGH (ref 8–23)
CO2: 26 mmol/L (ref 22–32)
Calcium: 9.4 mg/dL (ref 8.9–10.3)
Chloride: 108 mmol/L (ref 98–111)
Creatinine, Ser: 1.42 mg/dL — ABNORMAL HIGH (ref 0.61–1.24)
GFR calc Af Amer: 56 mL/min — ABNORMAL LOW (ref >60)
GFR calc non Af Amer: 48 mL/min — ABNORMAL LOW (ref >60)
Glucose, Bld: 124 mg/dL — ABNORMAL HIGH (ref 70–99)
Potassium: 5.1 mmol/L (ref 3.5–5.1)
Sodium: 141 mmol/L (ref 135–145)
Total Bilirubin: 0.9 mg/dL (ref 0.3–1.2)
Total Protein: 7.3 g/dL (ref 6.5–8.1)

## 2018-08-21 LAB — CBC
HCT: 37.5 % — ABNORMAL LOW (ref 39.0–52.0)
Hemoglobin: 11.8 g/dL — ABNORMAL LOW (ref 13.0–17.0)
MCH: 30.3 pg (ref 26.0–34.0)
MCHC: 31.5 g/dL (ref 30.0–36.0)
MCV: 96.4 fL (ref 80.0–100.0)
Platelets: 167 10*3/uL (ref 150–400)
RBC: 3.89 MIL/uL — ABNORMAL LOW (ref 4.22–5.81)
RDW: 13.4 % (ref 11.5–15.5)
WBC: 6.6 10*3/uL (ref 4.0–10.5)
nRBC: 0 % (ref 0.0–0.2)

## 2018-08-21 LAB — PROTIME-INR
INR: 1 (ref 0.8–1.2)
Prothrombin Time: 13.5 seconds (ref 11.4–15.2)

## 2018-08-21 LAB — HEMOGLOBIN A1C
Hgb A1c MFr Bld: 6.6 % — ABNORMAL HIGH (ref 4.8–5.6)
Mean Plasma Glucose: 142.72 mg/dL

## 2018-08-21 LAB — APTT: aPTT: 32 seconds (ref 24–36)

## 2018-08-21 LAB — GLUCOSE, CAPILLARY: Glucose-Capillary: 116 mg/dL — ABNORMAL HIGH (ref 70–99)

## 2018-08-21 LAB — SURGICAL PCR SCREEN
MRSA, PCR: NEGATIVE
Staphylococcus aureus: NEGATIVE

## 2018-08-21 NOTE — H&P (Signed)
TOTAL HIP REVISION ADMISSION H&P  Patient is admitted for left acetabular revision with constrained liner, right hip.  Subjective:  Chief Complaint: Recurrent dislocation, right hip  HPI: Lance Hill is a 73 year old male who presents for pre-operative visit in preparation for their right total hip revison, which is scheduled on 08-25-2018 with Dr. Lequita Halt at [facility]. The patient has had symptoms in the right hip including instability which has impacted their quality of life and ability to do activities of daily living. The patient currently has a diagnosis of right hip recurrent dislocation and has failed conservative treatments including activity modification.  Patient Active Problem List   Diagnosis Date Noted  . Failed total hip arthroplasty, subsequent encounter 06/26/2017  . Failed total hip arthroplasty, sequela 06/26/2017  . Spinal stenosis of lumbar region 02/06/2017  . Spinal stenosis, lumbar 02/06/2017   Past Medical History:  Diagnosis Date  . Arthritis   . Diabetes mellitus without complication (HCC)    type II   . GERD (gastroesophageal reflux disease)   . History of kidney stones    hx of   . Hypertension     Past Surgical History:  Procedure Laterality Date  . BACK SURGERY    . CHOLECYSTECTOMY    . EYE SURGERY     right eye surgery , bilateral cataract   . JOINT REPLACEMENT     hip and knee replacements   . LUMBAR LAMINECTOMY/DECOMPRESSION MICRODISCECTOMY Right 02/06/2017   Procedure: Microlumbar decompression L5-S1 right;  Surgeon: Jene Every, MD;  Location: [facility] ORS;  Service: Orthopedics;  Laterality: Right;  90 mins  . TONSILLECTOMY    . TOTAL HIP REVISION Right 06/26/2017   Procedure: Right hip femoral head revision and abductor tendon repair;  Surgeon: Ollen Gross, MD;  Location: [facility] ORS;  Service: Orthopedics;  Laterality: Right;    No current facility-administered medications for this encounter.    Current Outpatient Medications   Medication Sig Dispense Refill Last Dose  . acetaminophen (TYLENOL) 325 MG tablet Take 650 mg by mouth every 6 (six) hours as needed for moderate pain.   05/05/2018 at Unknown time  . amLODipine (NORVASC) 10 MG tablet Take 10 mg by mouth at bedtime.    05/05/2018 at Unknown time  . ARTIFICIAL TEAR OP Place 1 drop into both eyes daily as needed (for dry eyes).   Past Week at Unknown time  . aspirin EC 81 MG tablet Take 81 mg by mouth at bedtime.   05/05/2018 at Unknown time  . Brinzolamide-Brimonidine 1-0.2 % SUSP Place 1 drop into both eyes 3 (three) times daily. [facility]   05/06/2018 at Unknown time  . Cholecalciferol ([employer] VITAMIN D3) 1000 units capsule Take 2,000 Units by mouth daily.   05/06/2018 at Unknown time  . gabapentin (NEURONTIN) 300 MG capsule Take 600 mg by mouth 2 (two) times daily.    05/06/2018 at Unknown time  . glyBURIDE (DIABETA) 5 MG tablet Take 5 mg by mouth 2 (two) times daily with a meal.   05/06/2018 at Unknown time  . hydrochlorothiazide (HYDRODIURIL) 25 MG tablet Take 12.5 mg by mouth daily.   05/06/2018 at Unknown time  . loratadine (CLARITIN) 10 MG tablet Take 10 mg by mouth daily.   05/06/2018 at Unknown time  . losartan (COZAAR) 100 MG tablet Take 100 mg by mouth daily.   05/06/2018 at Unknown time  . magnesium oxide (MAG-OX) 400 MG tablet Take 400 mg by mouth daily.   05/06/2018 at Unknown  time  . Menthol, Topical Analgesic, (BIOFREEZE) 4 % GEL Apply 1 application topically daily as needed (pain).   05/05/2018 at Unknown time  . metFORMIN (GLUCOPHAGE) 500 MG tablet Take 500 mg by mouth 2 (two) times daily with a meal.    05/06/2018 at Unknown time  . Multiple Vitamin (MULTIVITAMIN WITH MINERALS) TABS tablet Take 1 tablet by mouth daily.   05/06/2018 at Unknown time  . Omega-3 Fatty Acids (FISH OIL) 1200 MG CAPS Take 1,200 mg by mouth at bedtime.   05/06/2018 at Unknown time  . OVER THE COUNTER MEDICATION Place 1 Dose under the tongue daily. Hemp Flower Extract 500 mg drops. 1/2 dropperful daily    05/06/2018 at Unknown time  . pioglitazone (ACTOS) 45 MG tablet Take 45 mg by mouth daily.   05/06/2018 at Unknown time  . ranitidine (ZANTAC) 300 MG tablet Take 300 mg by mouth 2 (two) times daily.    05/06/2018 at Unknown time  . simvastatin (ZOCOR) 20 MG tablet Take 20 mg by mouth at bedtime.    05/05/2018 at Unknown time  . traMADol (ULTRAM) 50 MG tablet Take 1-2 tablets (50-100 mg total) by mouth every 6 (six) hours as needed (mild pain). 56 tablet 0 Past Week at Unknown time  . trolamine salicylate (ASPERCREME) 10 % cream Apply 1 application topically as needed for muscle pain.   05/05/2018 at Unknown time   No Known Allergies  Social History   Tobacco Use  . Smoking status: Never Smoker  . Smokeless tobacco: Never Used  Substance Use Topics  . Alcohol use: Yes    Comment: glass wine once a month    No family history on file.    Review of Systems  Constitutional: Negative for chills and fever.  HENT: Negative for congestion, sore throat and tinnitus.   Eyes: Negative for double vision, photophobia and pain.  Respiratory: Negative for cough, shortness of breath and wheezing.   Cardiovascular: Negative for chest pain, palpitations and orthopnea.  Gastrointestinal: Negative for heartburn, nausea and vomiting.  Genitourinary: Negative for dysuria, frequency and urgency.  Musculoskeletal: Positive for joint pain.  Neurological: Negative for dizziness, weakness and headaches.    Objective:  Physical Exam  Well nourished and well developed.  General: Alert and oriented x3, cooperative and pleasant, no acute distress.  Head: normocephalic, atraumatic, neck supple.  Eyes: EOMI.  Respiratory: breath sounds clear in all fields, no wheezing, rales, or rhonchi. Cardiovascular: Regular rate and rhythm, no murmurs, gallops or rubs.  Abdomen: non-tender to palpation and soft, normoactive bowel sounds. Musculoskeletal:  Right Hip Exam: Well healed incision from previous THA.  ROM: Flexion to  100 with the knee immobilizer on. We did not perform any hip rotation today.   Calves soft and nontender. Motor function intact in LE. Strength 5/5 LE bilaterally. Neuro: Distal pulses 2+. Sensation to light touch intact in LE.   Vital signs in last 24 hours:  Blood pressure: 134/70 mmHg   Labs:   Estimated body mass index is 29.8 kg/m as calculated from the following:   Height as of 08/14/18: 5\' 8"  (1.727 m).   Weight as of 08/14/18: 88.9 kg.  Imaging Review:  Radiographs of the patient's right hip, obtained at the hospital on 05/06/2018, were reviewed independently, his cup appears to be minimally retroverted. He does not have any loosening of his components.     Assessment/Plan:  Recurrent dislocation, right hip  The patient history, physical examination, clinical judgement of the provider and  imaging studies are consistent with failed right total hip arthroplasty. Revision of the acetabular component with constrained liner placement is deemed medically necessary. The treatment options including medical management, injection therapy, arthroscopy and arthroplasty were discussed at length. The risks and benefits of total hip arthroplasty were presented and reviewed. The risks due to aseptic loosening, infection, stiffness, dislocation/subluxation,  thromboembolic complications and other imponderables were discussed.  The patient acknowledged the explanation, agreed to proceed with the plan and consent was signed. Patient is being admitted for inpatient treatment for surgery, pain control, PT, OT, prophylactic antibiotics, VTE prophylaxis, progressive ambulation and ADL's and discharge planning. The patient is planning to be discharged home.   Therapy Plans: HEP versus HHPT Disposition: Home with wife Planned DVT Prophylaxis: Aspirin 325 mg BID DME needed: None PCP: Dr. Gerlene FeePiva TXA: IV Allergies: NKDA Anesthesia Concerns: None BMI: 28.9 Last HgbA1c: Unsure. Will add to pre-op labs.   - Patient was instructed on what medications to stop prior to surgery. - Follow-up visit in 2 weeks with Dr. Lequita HaltAluisio - Begin physical therapy following surgery - Pre-operative lab work as pre-surgical testing - Prescriptions will be provided in hospital at time of discharge  Arther AbbottKristie Tresten Pantoja, PA-C Orthopedic Surgery [facility] [place]

## 2018-08-22 NOTE — Progress Notes (Signed)
Anesthesia Chart Review   Case:  [id number] Date/Time:  08/25/18 0915   Procedure:  Right acetabular revision; constrained liner (Right ) - to follow   Anesthesia type:  Choice   Pre-op diagnosis:  recurrent dislocation right total hip arthroplasty   Location:  [facility] / [facility]   Surgeon:  Ollen Gross, MD      DISCUSSION: 73 yo never smoker with h/o DM II, GERD, HTN, recurrent dislocation right total hip arthroplasty scheduled for above procedure 08/25/18 with Dr. Ollen Gross.    Clearance received from Dr. Andee Lineman 07/14/18, on chart.    Pt can proceed with planned procedure barring acute status change.  VS: BP 132/69 (BP Location: Left Arm)   Temp 36.7 C (Oral)   Resp 18   Ht 5\' 8"  (1.727 m)   Wt 89.5 kg   SpO2 99%   BMI 29.99 kg/m   PROVIDERS: Raye Sorrow, DO is PCP    LABS: Labs reviewed: Acceptable for surgery. (all labs ordered are listed, but only abnormal results are displayed)  Labs Reviewed  CBC - Abnormal; Notable for the following components:      Result Value   RBC 3.89 (*)    Hemoglobin 11.8 (*)    HCT 37.5 (*)    All other components within normal limits  COMPREHENSIVE METABOLIC PANEL - Abnormal; Notable for the following components:   Glucose, Bld 124 (*)    BUN 40 (*)    Creatinine, Ser 1.42 (*)    GFR calc non Af Amer 48 (*)    GFR calc Af Amer 56 (*)    All other components within normal limits  HEMOGLOBIN A1C - Abnormal; Notable for the following components:   Hgb A1c MFr Bld 6.6 (*)    All other components within normal limits  GLUCOSE, CAPILLARY - Abnormal; Notable for the following components:   Glucose-Capillary 116 (*)    All other components within normal limits  SURGICAL PCR SCREEN  APTT  PROTIME-INR  TYPE AND SCREEN     IMAGES:   EKG: 08/21/2018 Rate 65 bpm Normal sinus rhythm  Normal ECG No old tracing to compare   CV:  Past Medical History:  Diagnosis Date  . Arthritis   . Diabetes mellitus  without complication (HCC)    type II   . GERD (gastroesophageal reflux disease)   . History of kidney stones    hx of   . Hypertension     Past Surgical History:  Procedure Laterality Date  . BACK SURGERY    . CHOLECYSTECTOMY    . EYE SURGERY     right eye surgery , bilateral cataract   . JOINT REPLACEMENT     hip and knee replacements   . LUMBAR LAMINECTOMY/DECOMPRESSION MICRODISCECTOMY Right 02/06/2017   Procedure: Microlumbar decompression L5-S1 right;  Surgeon: Jene Every, MD;  Location: [facility];  Service: Orthopedics;  Laterality: Right;  90 mins  . TONSILLECTOMY    . TOTAL HIP REVISION Right 06/26/2017   Procedure: Right hip femoral head revision and abductor tendon repair;  Surgeon: Ollen Gross, MD;  Location: [facility];  Service: Orthopedics;  Laterality: Right;    MEDICATIONS: . acetaminophen (TYLENOL) 325 MG tablet  . amLODipine (NORVASC) 10 MG tablet  . ARTIFICIAL TEAR OP  . aspirin EC 81 MG tablet  . Brinzolamide-Brimonidine 1-0.2 % SUSP  . Cholecalciferol ([employer] VITAMIN D3) 1000 units capsule  . gabapentin (NEURONTIN) 300 MG capsule  . glyBURIDE (DIABETA)  5 MG tablet  . hydrochlorothiazide (HYDRODIURIL) 25 MG tablet  . loratadine (CLARITIN) 10 MG tablet  . losartan (COZAAR) 100 MG tablet  . magnesium oxide (MAG-OX) 400 MG tablet  . Menthol, Topical Analgesic, (BIOFREEZE) 4 % GEL  . metFORMIN (GLUCOPHAGE) 500 MG tablet  . Multiple Vitamin (MULTIVITAMIN WITH MINERALS) TABS tablet  . Omega-3 Fatty Acids (FISH OIL) 1200 MG CAPS  . OVER THE COUNTER MEDICATION  . pioglitazone (ACTOS) 45 MG tablet  . ranitidine (ZANTAC) 300 MG tablet  . simvastatin (ZOCOR) 20 MG tablet  . traMADol (ULTRAM) 50 MG tablet  . trolamine salicylate (ASPERCREME) 10 % cream   No current facility-administered medications for this encounter.    Lance GentaJessica Corbin Falck, PA-C [facility] [phone] 08/22/18 10:18 AM

## 2018-08-22 NOTE — Anesthesia Preprocedure Evaluation (Addendum)
Anesthesia Evaluation  Patient identified by MRN, date of birth, ID band Patient awake    Reviewed: Allergy & Precautions, NPO status , Patient's Chart, lab work & pertinent test results  History of Anesthesia Complications Negative for: history of anesthetic complications  Airway Mallampati: II  TM Distance: >3 FB Neck ROM: Full    Dental  (+) Teeth Intact   Pulmonary neg shortness of breath, neg sleep apnea, neg COPD, neg recent URI,    breath sounds clear to auscultation       Cardiovascular hypertension, Pt. on medications  Rhythm:Regular     Neuro/Psych Low BACK SURGERY no current symptoms negative psych ROS   GI/Hepatic Neg liver ROS, GERD  Medicated and Controlled,  Endo/Other  diabetes, Type 2, Oral Hypoglycemic Agents  Renal/GU negative Renal ROS     Musculoskeletal  (+) Arthritis , recurrent dislocation right total hip arthroplasty   Abdominal   Peds  Hematology Nl coags   Anesthesia Other Findings   Reproductive/Obstetrics                            Anesthesia Physical Anesthesia Plan  ASA: II  Anesthesia Plan: Spinal and MAC   Post-op Pain Management:    Induction:   PONV Risk Score and Plan: 1 and Treatment may vary due to age or medical condition and Propofol infusion  Airway Management Planned: Nasal Cannula  Additional Equipment: None  Intra-op Plan:   Post-operative Plan:   Informed Consent: I have reviewed the patients History and Physical, chart, labs and discussed the procedure including the risks, benefits and alternatives for the proposed anesthesia with the patient or authorized representative who has indicated his/her understanding and acceptance.     Dental advisory given  Plan Discussed with: CRNA and Surgeon  Anesthesia Plan Comments: (See PAT note 08/21/18, Konrad Felix, PA-C)       Anesthesia Quick Evaluation

## 2018-08-25 ENCOUNTER — Inpatient Hospital Stay (HOSPITAL_COMMUNITY): Payer: Medicare Other

## 2018-08-25 ENCOUNTER — Inpatient Hospital Stay (HOSPITAL_COMMUNITY): Payer: Medicare Other | Admitting: Physician Assistant

## 2018-08-25 ENCOUNTER — Other Ambulatory Visit: Payer: Self-pay

## 2018-08-25 ENCOUNTER — Inpatient Hospital Stay (HOSPITAL_COMMUNITY): Payer: Medicare Other | Admitting: Anesthesiology

## 2018-08-25 ENCOUNTER — Inpatient Hospital Stay (HOSPITAL_COMMUNITY)
Admission: RE | Admit: 2018-08-25 | Discharge: 2018-08-26 | DRG: 468 | Disposition: A | Discharge status: Home / Self Care | Payer: Medicare Other | Attending: Orthopedic Surgery | Admitting: Orthopedic Surgery

## 2018-08-25 ENCOUNTER — Encounter (HOSPITAL_COMMUNITY)
Admission: RE | Disposition: A | Discharge status: Home / Self Care | Payer: Self-pay | Source: Home / Self Care | Attending: Orthopedic Surgery

## 2018-08-25 ENCOUNTER — Encounter (HOSPITAL_COMMUNITY): Payer: Self-pay | Admitting: *Deleted

## 2018-08-25 DIAGNOSIS — T84090A Other mechanical complication of internal right hip prosthesis, initial encounter: Secondary | ICD-10-CM | POA: Diagnosis present

## 2018-08-25 DIAGNOSIS — T84020D Dislocation of internal right hip prosthesis, subsequent encounter: Secondary | ICD-10-CM | POA: Diagnosis not present

## 2018-08-25 DIAGNOSIS — Z96659 Presence of unspecified artificial knee joint: Secondary | ICD-10-CM | POA: Diagnosis present

## 2018-08-25 DIAGNOSIS — Y792 Prosthetic and other implants, materials and accessory orthopedic devices associated with adverse incidents: Secondary | ICD-10-CM | POA: Diagnosis present

## 2018-08-25 DIAGNOSIS — Z79899 Other long term (current) drug therapy: Secondary | ICD-10-CM

## 2018-08-25 DIAGNOSIS — Z96649 Presence of unspecified artificial hip joint: Secondary | ICD-10-CM

## 2018-08-25 DIAGNOSIS — T84018D Broken internal joint prosthesis, other site, subsequent encounter: Secondary | ICD-10-CM

## 2018-08-25 DIAGNOSIS — T84010A Broken internal right hip prosthesis, initial encounter: Secondary | ICD-10-CM | POA: Diagnosis not present

## 2018-08-25 DIAGNOSIS — M1990 Unspecified osteoarthritis, unspecified site: Secondary | ICD-10-CM | POA: Diagnosis present

## 2018-08-25 DIAGNOSIS — Z7982 Long term (current) use of aspirin: Secondary | ICD-10-CM | POA: Diagnosis not present

## 2018-08-25 DIAGNOSIS — T84028A Dislocation of other internal joint prosthesis, initial encounter: Secondary | ICD-10-CM

## 2018-08-25 DIAGNOSIS — Z7984 Long term (current) use of oral hypoglycemic drugs: Secondary | ICD-10-CM | POA: Diagnosis not present

## 2018-08-25 DIAGNOSIS — Z87442 Personal history of urinary calculi: Secondary | ICD-10-CM | POA: Diagnosis not present

## 2018-08-25 DIAGNOSIS — T84020A Dislocation of internal right hip prosthesis, initial encounter: Secondary | ICD-10-CM | POA: Diagnosis present

## 2018-08-25 DIAGNOSIS — M48061 Spinal stenosis, lumbar region without neurogenic claudication: Secondary | ICD-10-CM | POA: Diagnosis present

## 2018-08-25 DIAGNOSIS — Z96641 Presence of right artificial hip joint: Secondary | ICD-10-CM | POA: Diagnosis present

## 2018-08-25 DIAGNOSIS — I10 Essential (primary) hypertension: Secondary | ICD-10-CM | POA: Diagnosis present

## 2018-08-25 DIAGNOSIS — E119 Type 2 diabetes mellitus without complications: Secondary | ICD-10-CM | POA: Diagnosis present

## 2018-08-25 DIAGNOSIS — K219 Gastro-esophageal reflux disease without esophagitis: Secondary | ICD-10-CM | POA: Diagnosis present

## 2018-08-25 DIAGNOSIS — M24451 Recurrent dislocation, right hip: Secondary | ICD-10-CM | POA: Diagnosis not present

## 2018-08-25 HISTORY — DX: Presence of unspecified artificial hip joint: Z96.649

## 2018-08-25 HISTORY — PX: TOTAL HIP REVISION: SHX763

## 2018-08-25 LAB — GLUCOSE, CAPILLARY
Glucose-Capillary: 147 mg/dL — ABNORMAL HIGH (ref 70–99)
Glucose-Capillary: 157 mg/dL — ABNORMAL HIGH (ref 70–99)
Glucose-Capillary: 227 mg/dL — ABNORMAL HIGH (ref 70–99)
Glucose-Capillary: 171 mg/dL — ABNORMAL HIGH (ref 70–99)

## 2018-08-25 LAB — TYPE AND SCREEN
ABO/RH(D): O POS
Antibody Screen: NEGATIVE

## 2018-08-25 LAB — HEMOGLOBIN A1C
Hgb A1c MFr Bld: 6.5 % — ABNORMAL HIGH (ref 4.8–5.6)
Mean Plasma Glucose: 139.85 mg/dL

## 2018-08-25 SURGERY — TOTAL HIP REVISION
Anesthesia: Monitor Anesthesia Care | Site: Hip | Laterality: Right

## 2018-08-25 MED ORDER — PHENYLEPHRINE HCL (PRESSORS) 10 MG/ML IV SOLN
INTRAVENOUS | Status: AC
  Filled 2018-08-25: qty 1

## 2018-08-25 MED ORDER — CEFAZOLIN SODIUM-DEXTROSE 2-4 GM/100ML-% IV SOLN
2.0000 g | Freq: Four times a day (QID) | INTRAVENOUS | Status: AC
  Administered 2018-08-25 (×2): 2 g via INTRAVENOUS
  Filled 2018-08-25 (×2): qty 100

## 2018-08-25 MED ORDER — ONDANSETRON HCL 4 MG/2ML IJ SOLN: 4.0000 mg | Freq: Four times a day (QID) | INTRAMUSCULAR | Status: DC | PRN

## 2018-08-25 MED ORDER — EPHEDRINE SULFATE-NACL 50-0.9 MG/10ML-% IV SOSY
PREFILLED_SYRINGE | INTRAVENOUS | Status: DC | PRN
  Administered 2018-08-25: 10 mg via INTRAVENOUS

## 2018-08-25 MED ORDER — BUPIVACAINE IN DEXTROSE 0.75-8.25 % IT SOLN
INTRATHECAL | Status: DC | PRN
  Administered 2018-08-25: 1.8 mL via INTRATHECAL

## 2018-08-25 MED ORDER — GABAPENTIN 300 MG PO CAPS
600.0000 mg | ORAL_CAPSULE | Freq: Two times a day (BID) | ORAL | Status: DC
  Administered 2018-08-25 – 2018-08-26 (×2): 600 mg via ORAL
  Filled 2018-08-25 (×3): qty 2

## 2018-08-25 MED ORDER — ACETAMINOPHEN 500 MG PO TABS
500.0000 mg | ORAL_TABLET | Freq: Four times a day (QID) | ORAL | Status: AC
  Administered 2018-08-25 – 2018-08-26 (×4): 500 mg via ORAL
  Filled 2018-08-25 (×4): qty 1

## 2018-08-25 MED ORDER — FENTANYL CITRATE (PF) 100 MCG/2ML IJ SOLN
INTRAMUSCULAR | Status: AC
  Administered 2018-08-25: 12:00:00 50 ug via INTRAVENOUS
  Filled 2018-08-25: qty 2

## 2018-08-25 MED ORDER — ACETAMINOPHEN 10 MG/ML IV SOLN: 1000.0000 mg | Freq: Once | INTRAVENOUS | Status: DC | PRN

## 2018-08-25 MED ORDER — PROPOFOL 10 MG/ML IV BOLUS
INTRAVENOUS | Status: AC
  Filled 2018-08-25: qty 20

## 2018-08-25 MED ORDER — HYDROCODONE-ACETAMINOPHEN 5-325 MG PO TABS: 1.0000 | ORAL_TABLET | ORAL | Status: DC | PRN

## 2018-08-25 MED ORDER — LORATADINE 10 MG PO TABS
10.0000 mg | ORAL_TABLET | Freq: Every day | ORAL | Status: DC
  Administered 2018-08-26: 09:00:00 10 mg via ORAL
  Filled 2018-08-25: qty 1

## 2018-08-25 MED ORDER — POVIDONE-IODINE 10 % EX SWAB: 2.0000 "application " | Freq: Once | CUTANEOUS | Status: DC

## 2018-08-25 MED ORDER — SIMVASTATIN 20 MG PO TABS
20.0000 mg | ORAL_TABLET | Freq: Every day | ORAL | Status: DC
  Administered 2018-08-25: 22:00:00 20 mg via ORAL
  Filled 2018-08-25: qty 1

## 2018-08-25 MED ORDER — LOSARTAN POTASSIUM 50 MG PO TABS
100.0000 mg | ORAL_TABLET | Freq: Every day | ORAL | Status: DC
  Administered 2018-08-26: 100 mg via ORAL
  Filled 2018-08-25: qty 2

## 2018-08-25 MED ORDER — METHOCARBAMOL 1000 MG/10ML IJ SOLN
500.0000 mg | Freq: Four times a day (QID) | INTRAVENOUS | Status: DC | PRN
  Filled 2018-08-25: qty 5

## 2018-08-25 MED ORDER — DEXAMETHASONE SODIUM PHOSPHATE 10 MG/ML IJ SOLN
10.0000 mg | Freq: Once | INTRAMUSCULAR | Status: AC
  Administered 2018-08-26: 09:00:00 10 mg via INTRAVENOUS
  Filled 2018-08-25: qty 1

## 2018-08-25 MED ORDER — MORPHINE SULFATE (PF) 4 MG/ML IV SOLN: 0.5000 mg | INTRAVENOUS | Status: DC | PRN

## 2018-08-25 MED ORDER — OXYCODONE HCL 5 MG/5ML PO SOLN: 5.0000 mg | Freq: Once | ORAL | Status: DC | PRN

## 2018-08-25 MED ORDER — AMLODIPINE BESYLATE 10 MG PO TABS
10.0000 mg | ORAL_TABLET | Freq: Every day | ORAL | Status: DC
  Administered 2018-08-25: 10 mg via ORAL
  Filled 2018-08-25: qty 1

## 2018-08-25 MED ORDER — PROPOFOL 10 MG/ML IV BOLUS
INTRAVENOUS | Status: AC
  Filled 2018-08-25: qty 40

## 2018-08-25 MED ORDER — ACETAMINOPHEN 10 MG/ML IV SOLN
1000.0000 mg | Freq: Four times a day (QID) | INTRAVENOUS | Status: DC
  Administered 2018-08-25: 1000 mg via INTRAVENOUS
  Filled 2018-08-25: qty 100

## 2018-08-25 MED ORDER — FLEET ENEMA 7-19 GM/118ML RE ENEM: 1.0000 | ENEMA | Freq: Once | RECTAL | Status: DC | PRN

## 2018-08-25 MED ORDER — DEXAMETHASONE SODIUM PHOSPHATE 10 MG/ML IJ SOLN: 8.0000 mg | Freq: Once | INTRAMUSCULAR | Status: DC

## 2018-08-25 MED ORDER — PHENOL 1.4 % MT LIQD: 1.0000 | OROMUCOSAL | Status: DC | PRN

## 2018-08-25 MED ORDER — SODIUM CHLORIDE 0.9 % IR SOLN
Status: DC | PRN
  Administered 2018-08-25: 1000 mL

## 2018-08-25 MED ORDER — MORPHINE SULFATE (PF) 2 MG/ML IV SOLN: 0.5000 mg | INTRAVENOUS | Status: DC | PRN

## 2018-08-25 MED ORDER — BUPIVACAINE-EPINEPHRINE (PF) 0.25% -1:200000 IJ SOLN
INTRAMUSCULAR | Status: AC
  Filled 2018-08-25: qty 30

## 2018-08-25 MED ORDER — POLYETHYLENE GLYCOL 3350 17 G PO PACK: 17.0000 g | PACK | Freq: Every day | ORAL | Status: DC | PRN

## 2018-08-25 MED ORDER — METOCLOPRAMIDE HCL 5 MG/ML IJ SOLN: 5.0000 mg | Freq: Three times a day (TID) | INTRAMUSCULAR | Status: DC | PRN

## 2018-08-25 MED ORDER — CHLORHEXIDINE GLUCONATE 4 % EX LIQD: 60.0000 mL | Freq: Once | CUTANEOUS | Status: DC

## 2018-08-25 MED ORDER — ONDANSETRON HCL 4 MG/2ML IJ SOLN
INTRAMUSCULAR | Status: AC
  Filled 2018-08-25: qty 2

## 2018-08-25 MED ORDER — CEFAZOLIN SODIUM-DEXTROSE 2-4 GM/100ML-% IV SOLN
2.0000 g | INTRAVENOUS | Status: AC
  Administered 2018-08-25: 2 g via INTRAVENOUS
  Filled 2018-08-25: qty 100

## 2018-08-25 MED ORDER — MENTHOL 3 MG MT LOZG: 1.0000 | LOZENGE | OROMUCOSAL | Status: DC | PRN

## 2018-08-25 MED ORDER — EPHEDRINE 5 MG/ML INJ
INTRAVENOUS | Status: AC
  Filled 2018-08-25: qty 10

## 2018-08-25 MED ORDER — SODIUM CHLORIDE 0.9 % IV SOLN
INTRAVENOUS | Status: DC
  Administered 2018-08-25: 14:00:00 via INTRAVENOUS

## 2018-08-25 MED ORDER — BUPIVACAINE HCL 0.25 % IJ SOLN
INTRAMUSCULAR | Status: DC | PRN
  Administered 2018-08-25: 30 mL

## 2018-08-25 MED ORDER — FAMOTIDINE 20 MG PO TABS
20.0000 mg | ORAL_TABLET | Freq: Two times a day (BID) | ORAL | Status: DC
  Administered 2018-08-25 – 2018-08-26 (×2): 20 mg via ORAL
  Filled 2018-08-25 (×2): qty 1

## 2018-08-25 MED ORDER — METOCLOPRAMIDE HCL 5 MG PO TABS: 5.0000 mg | ORAL_TABLET | Freq: Three times a day (TID) | ORAL | Status: DC | PRN

## 2018-08-25 MED ORDER — FENTANYL CITRATE (PF) 100 MCG/2ML IJ SOLN
25.0000 ug | INTRAMUSCULAR | Status: DC | PRN
  Administered 2018-08-25 (×2): 50 ug via INTRAVENOUS

## 2018-08-25 MED ORDER — GLYBURIDE 5 MG PO TABS
5.0000 mg | ORAL_TABLET | Freq: Two times a day (BID) | ORAL | Status: DC
  Administered 2018-08-26: 09:00:00 5 mg via ORAL
  Filled 2018-08-25 (×2): qty 1

## 2018-08-25 MED ORDER — SODIUM CHLORIDE 0.9 % IV SOLN
INTRAVENOUS | Status: DC | PRN
  Administered 2018-08-25: 10:00:00 50 ug/min via INTRAVENOUS

## 2018-08-25 MED ORDER — BISACODYL 10 MG RE SUPP: 10.0000 mg | Freq: Every day | RECTAL | Status: DC | PRN

## 2018-08-25 MED ORDER — DIPHENHYDRAMINE HCL 12.5 MG/5ML PO ELIX: 12.5000 mg | ORAL_SOLUTION | ORAL | Status: DC | PRN

## 2018-08-25 MED ORDER — TRAMADOL HCL 50 MG PO TABS: 50.0000 mg | ORAL_TABLET | Freq: Four times a day (QID) | ORAL | Status: DC | PRN

## 2018-08-25 MED ORDER — ACETAMINOPHEN 160 MG/5ML PO SOLN: 1000.0000 mg | Freq: Once | ORAL | Status: DC | PRN

## 2018-08-25 MED ORDER — METHOCARBAMOL 500 MG PO TABS: 500.0000 mg | ORAL_TABLET | Freq: Four times a day (QID) | ORAL | Status: DC | PRN

## 2018-08-25 MED ORDER — PIOGLITAZONE HCL 45 MG PO TABS
45.0000 mg | ORAL_TABLET | Freq: Every day | ORAL | Status: DC
  Administered 2018-08-26: 45 mg via ORAL
  Filled 2018-08-25: qty 1

## 2018-08-25 MED ORDER — ONDANSETRON HCL 4 MG PO TABS: 4.0000 mg | ORAL_TABLET | Freq: Four times a day (QID) | ORAL | Status: DC | PRN

## 2018-08-25 MED ORDER — ACETAMINOPHEN 500 MG PO TABS: 1000.0000 mg | ORAL_TABLET | Freq: Once | ORAL | Status: DC | PRN

## 2018-08-25 MED ORDER — LACTATED RINGERS IV SOLN: INTRAVENOUS | Status: DC

## 2018-08-25 MED ORDER — LACTATED RINGERS IV SOLN
INTRAVENOUS | Status: DC
  Administered 2018-08-25 (×2): via INTRAVENOUS

## 2018-08-25 MED ORDER — PROPOFOL 500 MG/50ML IV EMUL
INTRAVENOUS | Status: DC | PRN
  Administered 2018-08-25: 50 ug/kg/min via INTRAVENOUS

## 2018-08-25 MED ORDER — OXYCODONE HCL 5 MG PO TABS: 5.0000 mg | ORAL_TABLET | Freq: Once | ORAL | Status: DC | PRN

## 2018-08-25 MED ORDER — ASPIRIN EC 325 MG PO TBEC
325.0000 mg | DELAYED_RELEASE_TABLET | Freq: Two times a day (BID) | ORAL | Status: DC
  Administered 2018-08-26: 09:00:00 325 mg via ORAL
  Filled 2018-08-25: qty 1

## 2018-08-25 MED ORDER — INSULIN ASPART 100 UNIT/ML ~~LOC~~ SOLN
0.0000 [IU] | Freq: Three times a day (TID) | SUBCUTANEOUS | Status: DC
  Administered 2018-08-25: 17:00:00 5 [IU] via SUBCUTANEOUS
  Administered 2018-08-26: 09:00:00 2 [IU] via SUBCUTANEOUS
  Administered 2018-08-26: 12:00:00 5 [IU] via SUBCUTANEOUS

## 2018-08-25 MED ORDER — HYDROCHLOROTHIAZIDE 25 MG PO TABS
12.5000 mg | ORAL_TABLET | Freq: Every day | ORAL | Status: DC
  Administered 2018-08-26: 09:00:00 12.5 mg via ORAL
  Filled 2018-08-25: qty 1

## 2018-08-25 MED ORDER — STERILE WATER FOR IRRIGATION IR SOLN
Status: DC | PRN
  Administered 2018-08-25: 2000 mL

## 2018-08-25 MED ORDER — TRANEXAMIC ACID-NACL 1000-0.7 MG/100ML-% IV SOLN
1000.0000 mg | INTRAVENOUS | Status: AC
  Administered 2018-08-25: 10:00:00 1000 mg via INTRAVENOUS
  Filled 2018-08-25: qty 100

## 2018-08-25 MED ORDER — DOCUSATE SODIUM 100 MG PO CAPS
100.0000 mg | ORAL_CAPSULE | Freq: Two times a day (BID) | ORAL | Status: DC
  Administered 2018-08-25: 100 mg via ORAL
  Filled 2018-08-25 (×2): qty 1

## 2018-08-25 MED ORDER — PROPOFOL 10 MG/ML IV BOLUS
INTRAVENOUS | Status: DC | PRN
  Administered 2018-08-25: 20 mg via INTRAVENOUS

## 2018-08-25 MED ORDER — ONDANSETRON HCL 4 MG/2ML IJ SOLN
INTRAMUSCULAR | Status: DC | PRN
  Administered 2018-08-25: 4 mg via INTRAVENOUS

## 2018-08-25 MED ORDER — TRANEXAMIC ACID-NACL 1000-0.7 MG/100ML-% IV SOLN
1000.0000 mg | Freq: Once | INTRAVENOUS | Status: AC
  Administered 2018-08-25: 17:00:00 1000 mg via INTRAVENOUS
  Filled 2018-08-25: qty 100

## 2018-08-25 SURGICAL SUPPLY — 73 items
BAG DECANTER FOR FLEXI CONT (MISCELLANEOUS) ×3 IMPLANT
BAG ZIPLOCK 12X15 (MISCELLANEOUS) ×6 IMPLANT
BIT DRILL 2.8X128 (BIT) ×2 IMPLANT
BIT DRILL 2.8X128MM (BIT) ×1
BLADE EXTENDED COATED 6.5IN (ELECTRODE) ×3 IMPLANT
BLADE SAW SAG 73X25 THK (BLADE)
BLADE SAW SGTL 73X25 THK (BLADE) IMPLANT
BRUSH FEMORAL CANAL (MISCELLANEOUS) IMPLANT
CHLORAPREP W/TINT 26 (MISCELLANEOUS) ×3 IMPLANT
CLOSURE STERI-STRIP 1/2X4 (GAUZE/BANDAGES/DRESSINGS) ×2
CLSR STERI-STRIP ANTIMIC 1/2X4 (GAUZE/BANDAGES/DRESSINGS) ×4 IMPLANT
COVER SURGICAL LIGHT HANDLE (MISCELLANEOUS) ×3 IMPLANT
COVER WAND RF STERILE (DRAPES) IMPLANT
DRAPE INCISE IOBAN 66X45 STRL (DRAPES) ×3 IMPLANT
DRAPE ORTHO SPLIT 77X108 STRL (DRAPES) ×4
DRAPE POUCH INSTRU U-SHP 10X18 (DRAPES) ×3 IMPLANT
DRAPE SURG ORHT 6 SPLT 77X108 (DRAPES) ×2 IMPLANT
DRAPE U-SHAPE 47X51 STRL (DRAPES) ×3 IMPLANT
DRSG ADAPTIC 3X8 NADH LF (GAUZE/BANDAGES/DRESSINGS) ×3 IMPLANT
DRSG EMULSION OIL 3X16 NADH (GAUZE/BANDAGES/DRESSINGS) ×3 IMPLANT
DRSG MEPILEX BORDER 4X12 (GAUZE/BANDAGES/DRESSINGS) ×3 IMPLANT
DRSG MEPILEX BORDER 4X4 (GAUZE/BANDAGES/DRESSINGS) ×6 IMPLANT
DRSG MEPILEX BORDER 4X8 (GAUZE/BANDAGES/DRESSINGS) ×3 IMPLANT
ELECT REM PT RETURN 15FT ADLT (MISCELLANEOUS) ×3 IMPLANT
EVACUATOR 1/8 PVC DRAIN (DRAIN) ×3 IMPLANT
GAUZE SPONGE 4X4 12PLY STRL (GAUZE/BANDAGES/DRESSINGS) ×3 IMPLANT
GLOVE BIO SURGEON STRL SZ7 (GLOVE) ×3 IMPLANT
GLOVE BIO SURGEON STRL SZ8 (GLOVE) ×3 IMPLANT
GLOVE BIOGEL PI IND STRL 7.0 (GLOVE) ×1 IMPLANT
GLOVE BIOGEL PI IND STRL 8 (GLOVE) ×1 IMPLANT
GLOVE BIOGEL PI INDICATOR 7.0 (GLOVE) ×2
GLOVE BIOGEL PI INDICATOR 8 (GLOVE) ×2
GOWN STRL REUS W/TWL LRG LVL3 (GOWN DISPOSABLE) ×6 IMPLANT
HANDPIECE INTERPULSE COAX TIP (DISPOSABLE)
HEAD FEMORA LFIT V40 22.2 +3 (Miscellaneous) ×3 IMPLANT
IMMOBILIZER KNEE 20 (SOFTGOODS)
IMMOBILIZER KNEE 20 THIGH 36 (SOFTGOODS) IMPLANT
INSERT TRIDENT CONSTR ACE SZE (Insert) ×3 IMPLANT
JET LAVAGE IRRISEPT WOUND (IRRIGATION / IRRIGATOR) ×3
KIT TURNOVER KIT A (KITS) IMPLANT
LAVAGE JET IRRISEPT WOUND (IRRIGATION / IRRIGATOR) ×1 IMPLANT
MANIFOLD NEPTUNE II (INSTRUMENTS) ×3 IMPLANT
MARKER SKIN DUAL TIP RULER LAB (MISCELLANEOUS) IMPLANT
NDL SAFETY ECLIPSE 18X1.5 (NEEDLE) ×1 IMPLANT
NEEDLE HYPO 18GX1.5 SHARP (NEEDLE) ×2
NS IRRIG 1000ML POUR BTL (IV SOLUTION) ×3 IMPLANT
PACK TOTAL JOINT (CUSTOM PROCEDURE TRAY) ×3 IMPLANT
PASSER SUT SWANSON 36MM LOOP (INSTRUMENTS) ×3 IMPLANT
PRESSURIZER FEMORAL UNIV (MISCELLANEOUS) IMPLANT
PROTECTOR NERVE ULNAR (MISCELLANEOUS) ×3 IMPLANT
SCREW GAP PLATE REST 6.5X20MM (Screw) ×3 IMPLANT
SCREW GAP PLATE REST 6.5X30MM (Screw) ×3 IMPLANT
SET HNDPC FAN SPRY TIP SCT (DISPOSABLE) IMPLANT
SHELL TRIDENT TRITAN HEMIS SZE (Orthopedic Implant) ×3 IMPLANT
SPONGE LAP 18X18 RF (DISPOSABLE) ×3 IMPLANT
STAPLER VISISTAT 35W (STAPLE) IMPLANT
SUCTION FRAZIER HANDLE 12FR (TUBING) ×2
SUCTION TUBE FRAZIER 12FR DISP (TUBING) ×1 IMPLANT
SUT ETHIBOND NAB CT1 #1 30IN (SUTURE) ×6 IMPLANT
SUT STRATAFIX 0 PDS 27 VIOLET (SUTURE) ×3
SUT VIC AB 1 CT1 27 (SUTURE) ×6
SUT VIC AB 1 CT1 27XBRD ANTBC (SUTURE) ×3 IMPLANT
SUT VIC AB 2-0 CT1 27 (SUTURE) ×6
SUT VIC AB 2-0 CT1 TAPERPNT 27 (SUTURE) ×3 IMPLANT
SUTURE STRATFX 0 PDS 27 VIOLET (SUTURE) ×1 IMPLANT
SWAB COLLECTION DEVICE MRSA (MISCELLANEOUS) IMPLANT
SWAB CULTURE ESWAB REG 1ML (MISCELLANEOUS) IMPLANT
SYR 50ML LL SCALE MARK (SYRINGE) ×3 IMPLANT
TOWEL OR 17X26 10 PK STRL BLUE (TOWEL DISPOSABLE) ×6 IMPLANT
TOWER CARTRIDGE SMART MIX (DISPOSABLE) IMPLANT
TRAY FOLEY MTR SLVR 16FR STAT (SET/KITS/TRAYS/PACK) ×3 IMPLANT
WATER STERILE IRR 1000ML POUR (IV SOLUTION) ×6 IMPLANT
YANKAUER SUCT BULB TIP 10FT TU (MISCELLANEOUS) ×3 IMPLANT

## 2018-08-25 NOTE — Brief Op Note (Signed)
08/25/2018  11:22 AM  PATIENT:  Hilbert Odor  73 y.o. male  PRE-OPERATIVE DIAGNOSIS:  recurrent dislocation right total hip arthroplasty  POST-OPERATIVE DIAGNOSIS:  recurrent dislocation right total hip arthroplasty  PROCEDURE:  Procedure(s) with comments: Right acetabular revision (Right) - to follow  SURGEON:  Surgeon(s) and Role:    Ollen Gross, MD - Primary  PHYSICIAN ASSISTANT:   ASSISTANTS: Arther Abbott, PA-C   ANESTHESIA:   spinal  EBL:  300 mL   BLOOD ADMINISTERED:none  DRAINS: (Medium) Hemovact drain(s) in the right hip with  Suction Open   LOCAL MEDICATIONS USED:  MARCAINE     COUNTS:  YES  TOURNIQUET:  * No tourniquets in log *  DICTATION: .Other Dictation: Dictation Number [id number]  PLAN OF CARE: Admit to inpatient   PATIENT DISPOSITION:  PACU - hemodynamically stable.

## 2018-08-25 NOTE — Interval H&P Note (Signed)
History and Physical Interval Note:  08/25/2018 9:01 AM  Lance Hill  has presented today for surgery, with the diagnosis of recurrent dislocation right total hip arthroplasty.  The various methods of treatment have been discussed with the patient and family. After consideration of risks, benefits and other options for treatment, the patient has consented to  Procedure(s) with comments: Right acetabular revision; constrained liner (Right) - to follow as a surgical intervention.  The patient's history has been reviewed, patient examined, no change in status, stable for surgery.  I have reviewed the patient's chart and labs.  Questions were answered to the patient's satisfaction.     Homero Fellers Nijae Doyel

## 2018-08-25 NOTE — Discharge Instructions (Signed)
° °Dr. Frank Aluisio °Total Joint Specialist °[facility] °[address] °[place], [place] [address] °[phone] ° °POSTERIOR TOTAL HIP REPLACEMENT POSTOPERATIVE DIRECTIONS ° °Hip Rehabilitation, Guidelines Following Surgery  °The results of a hip operation are greatly improved after range of motion and muscle strengthening exercises. Follow all safety measures which are given to protect your hip. If any of these exercises cause increased pain or swelling in your joint, decrease the amount until you are comfortable again. Then slowly increase the exercises. Call your caregiver if you have problems or questions.  ° °HOME CARE INSTRUCTIONS  °• Remove items at home which could result in a fall. This includes throw rugs or furniture in walking pathways.  °· ICE to the affected hip every three hours for 30 minutes at a time and then as needed for pain and swelling.  Continue to use ice on the hip for pain and swelling from surgery. You may notice swelling that will progress down to the foot and ankle.  This is normal after surgery.  Elevate the leg when you are not up walking on it.   °· Continue to use the breathing machine which will help keep your temperature down.  It is common for your temperature to cycle up and down following surgery, especially at night when you are not up moving around and exerting yourself.  The breathing machine keeps your lungs expanded and your temperature down. ° °DIET °You may resume your previous home diet once your are discharged from the hospital. ° °DRESSING / WOUND CARE / SHOWERING °You may change your dressing 3-5 days after surgery.  Then change the dressing every day with sterile gauze.  Please use good hand washing techniques before changing the dressing.  Do not use any lotions or creams on the incision until instructed by your surgeon. °You may start showering once you are discharged home but do not submerge the incision under water. Just pat the incision dry  and apply a dry gauze dressing on daily. °Change the surgical dressing daily and reapply a dry dressing each time. ° °ACTIVITY °Walk with your walker as instructed. °Use walker as long as suggested by your caregivers. °Avoid periods of inactivity such as sitting longer than an hour when not asleep. This helps prevent blood clots.  °You may resume a sexual relationship in one month or when given the OK by your doctor.  °You may return to work once you are cleared by your doctor.  °Do not drive a car for 6 weeks or until released by you surgeon.  °Do not drive while taking narcotics. ° °WEIGHT BEARING °Weight bearing as tolerated with assist device (walker, cane, etc) as directed, use it as long as suggested by your surgeon or therapist, typically at least 4-6 weeks. ° °POSTOPERATIVE CONSTIPATION PROTOCOL °Constipation - defined medically as fewer than three stools per week and severe constipation as less than one stool per week. ° °One of the most common issues patients have following surgery is constipation.  Even if you have a regular bowel pattern at home, your normal regimen is likely to be disrupted due to multiple reasons following surgery.  Combination of anesthesia, postoperative narcotics, change in appetite and fluid intake all can affect your bowels.  In order to avoid complications following surgery, here are some recommendations in order to help you during your recovery period. ° °Colace (docusate) - Pick up an over-the-counter form of Colace or another stool softener and take twice a day as   long as you are requiring postoperative pain medications.  Take with a full glass of water daily.  If you experience loose stools or diarrhea, hold the colace until you stool forms back up.  If your symptoms do not get better within 1 week or if they get worse, check with your doctor. ° °Dulcolax (bisacodyl) - Pick up over-the-counter and take as directed by the product packaging as needed to assist with the movement  of your bowels.  Take with a full glass of water.  Use this product as needed if not relieved by Colace only.  ° °MiraLax (polyethylene glycol) - Pick up over-the-counter to have on hand.  MiraLax is a solution that will increase the amount of water in your bowels to assist with bowel movements.  Take as directed and can mix with a glass of water, juice, soda, coffee, or tea.  Take if you go more than two days without a movement. °Do not use MiraLax more than once per day. Call your doctor if you are still constipated or irregular after using this medication for 7 days in a row. ° °If you continue to have problems with postoperative constipation, please contact the office for further assistance and recommendations.  If you experience "the worst abdominal pain ever" or develop nausea or vomiting, please contact the office immediatly for further recommendations for treatment. ° °ITCHING ° If you experience itching with your medications, try taking only a single pain pill, or even half a pain pill at a time.  You can also use Benadryl over the counter for itching or also to help with sleep.  ° °TED HOSE STOCKINGS °Wear the elastic stockings on both legs for three weeks following surgery during the day but you may remove then at night for sleeping. ° °MEDICATIONS °See your medication summary on the “After Visit Summary” that the nursing staff will review with you prior to discharge.  You may have some home medications which will be placed on hold until you complete the course of blood thinner medication.  It is important for you to complete the blood thinner medication as prescribed by your surgeon.  Continue your approved medications as instructed at time of discharge. ° °PRECAUTIONS °If you experience chest pain or shortness of breath - call 911 immediately for transfer to the hospital emergency department.  °If you develop a fever greater that 101 F, purulent drainage from wound, increased redness or drainage from  wound, foul odor from the wound/dressing, or calf pain - CONTACT YOUR SURGEON.   °                                                °FOLLOW-UP APPOINTMENTS °Make sure you keep all of your appointments after your operation with your surgeon and caregivers. You should call the office at the above phone number and make an appointment for approximately two weeks after the date of your surgery or on the date instructed by your surgeon outlined in the "After Visit Summary". ° °RANGE OF MOTION AND STRENGTHENING EXERCISES  °These exercises are designed to help you keep full movement of your hip joint. Follow your caregiver's or physical therapist's instructions. Perform all exercises about fifteen times, three times per day or as directed. Exercise both hips, even if you have had only one joint replacement. These exercises can be done on a   training (exercise) mat, on the floor, on a table or on a bed. Use whatever works the best and is most comfortable for you. Use music or television while you are exercising so that the exercises are a pleasant break in your day. This will make your life better with the exercises acting as a break in routine you can look forward to.  °• Lying on your back, slowly slide your foot toward your buttocks, raising your knee up off the floor. Then slowly slide your foot back down until your leg is straight again.  °• Lying on your back spread your legs as far apart as you can without causing discomfort.  °• Lying on your side, raise your upper leg and foot straight up from the floor as far as is comfortable. Slowly lower the leg and repeat.  °• Lying on your back, tighten up the muscle in the front of your thigh (quadriceps muscles). You can do this by keeping your leg straight and trying to raise your heel off the floor. This helps strengthen the largest muscle supporting your knee.  °• Lying on your back, tighten up the muscles of your buttocks both with the legs straight and with the knee bent  at a comfortable angle while keeping your heel on the floor.  ° °IF YOU ARE TRANSFERRED TO A SKILLED REHAB FACILITY °If the patient is transferred to a skilled rehab facility following release from the hospital, a list of the current medications will be sent to the facility for the patient to continue.  When discharged from the skilled rehab facility, please have the facility set up the patient's Home Health Physical Therapy prior to being released. Also, the skilled facility will be responsible for providing the patient with their medications at time of release from the facility to include their pain medication, the muscle relaxants, and their blood thinner medication. If the patient is still at the rehab facility at time of the two week follow up appointment, the skilled rehab facility will also need to assist the patient in arranging follow up appointment in our office and any transportation needs. ° °MAKE SURE YOU:  °• Understand these instructions.  °• Get help right away if you are not doing well or get worse.  ° ° °Pick up stool softner and laxative for home use following surgery while on pain medications. °Do not submerge incision under water. °Please use good hand washing techniques while changing dressing each day. °May shower starting three days after surgery. °Please use a clean towel to pat the incision dry following showers. °Continue to use ice for pain and swelling after surgery. °Do not use any lotions or creams on the incision until instructed by your surgeon. ° ° °

## 2018-08-25 NOTE — Anesthesia Procedure Notes (Signed)
Spinal  Patient location during procedure: OR Staffing Resident/CRNA: [place] Tyreik Delahoussaye C, CRNA Performed: resident/CRNA  Preanesthetic Checklist Completed: patient identified, site marked, surgical consent, pre-op evaluation, timeout performed, IV checked, risks and benefits discussed and monitors and equipment checked Spinal Block Patient position: sitting Prep: DuraPrep Patient monitoring: heart rate, cardiac monitor, continuous pulse ox and blood pressure Approach: left paramedian Location: L3-4 Injection technique: single-shot Needle Needle type: Pencan  Needle gauge: 24 G Needle length: 9 cm Assessment Sensory level: T4 Additional Notes IV functioning, monitors applied to pt. Expiration date of kit checked and confirmed to be in date. Sterile prep and drape, hand hygiene and sterile gloved used. Pt was positioned and spine was prepped in sterile fashion. Skin was anesthetized with lidocaine. Free flow of clear CSF obtained prior to injecting local anesthetic into CSF x 1 attempt. Spinal needle aspirated freely following injection. Needle was carefully withdrawn, and pt tolerated procedure well. Loss of motor and sensory on exam post injection.

## 2018-08-25 NOTE — Transfer of Care (Signed)
Immediate Anesthesia Transfer of Care Note  Patient: Lance Hill  Procedure(s) Performed: Right acetabular revision (Right Hip)  Patient Location: PACU  Anesthesia Type:Spinal  Level of Consciousness: awake, alert  and oriented  Airway & Oxygen Therapy: Patient Spontanous Breathing and Patient connected to face mask oxygen  Post-op Assessment: Report given to RN and Post -op Vital signs reviewed and stable  Post vital signs: Reviewed and stable  Last Vitals:  Vitals Value Taken Time  BP 102/62 08/25/2018 11:50 AM  Temp    Pulse 75 08/25/2018 11:53 AM  Resp 12 08/25/2018 11:53 AM  SpO2 100 % 08/25/2018 11:53 AM  Vitals shown include unvalidated device data.  Last Pain:  Vitals:   08/25/18 0756  TempSrc: Oral         Complications: No apparent anesthesia complications

## 2018-08-25 NOTE — Op Note (Signed)
NAMHilbert Odor: Dillin, [place]arron MEDICAL RECORD [id number] ACCOUNT [id number] DATE OF BIRTH:[birth year] FACILITY: [facility] LOCATION: [facility] PHYSICIAN:Elina Streng Dulcy FannyV. Aalayah Riles, MD  OPERATIVE REPORT  DATE OF PROCEDURE:  08/25/2018  PREOPERATIVE DIAGNOSIS:  Failed right total hip arthroplasty secondary to recurrent dislocations.  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  PROCEDURE:  Right acetabular revision.  SURGEON:  Ollen GrossFrank Tzion Wedel, M.D.  ASSISTANT:  Arther AbbottKristie Edmisten, PA-C  ANESTHESIA:  Spinal.  ESTIMATED BLOOD LOSS:  300 mL.  DRAINS:  Hemovac x1.  COMPLICATIONS:  None.  CONDITION:  Stable to recovery.  BRIEF CLINICAL NOTE:  The patient is a 73 year old male, had a right total hip arthroplasty performed at an outside institution several years ago.  He has had multiple dislocations recently.  He presents now for a right constrained liner versus  acetabular versus total hip arthroplasty revision.  PROCEDURE IN DETAIL:  After successful administration of spinal anesthetic, the patient was placed in the left lateral decubitus position with the right side up and held with a hip positioner.  Right lower extremity was isolated from his perineum with  plastic drapes and prepped and draped in the usual sterile fashion.  A posterolateral incision was made with a 10 blade through the subcutaneous tissue to the level of the fascia lata, which was incised in line with the hip incision.  He has a small  hematoma present from his recent dislocation.  The soft tissue on the posterior aspect of the proximal femur was essentially torn off from the previous dislocation.  I dislocated his hip and removed his femoral head, which was a 36+5.  The femoral stem  was in good position and is well fixed.  We then retracted the femur anteriorly to gain acetabular exposure.  The soft tissue around the rim of the acetabulum was removed to gain access to the cup.  The cup  was placed in excellent abduction and was  neutral to 5 degrees of anteversion.  I removed the acetabular liner and a Synthes shell was well fixed and in good position.  I decided was going to place a constrained implant.  I attempted to place a constrained liner, but it would not locked in.   Upon closer inspection of the cup, one of the locking tabs was broken from either one of his dislocations or the previous surgery.  Because of this, the constrained liner would not lock in place.  Thus, I needed to remove the cup in order to get a cup  that would lock the liner into position.  We then removed the screw from the acetabular shell and then using osteotomes and the [facility] revision osteotomes, I was able to remove the acetabular shell with essentially no bone loss.  It was a 52 shell that  was removed.  I started reaming 51 up to 53 mm.  A 54 mm Trident-Stryker cup was then placed in approximately 40 degrees of abduction and 15 degrees of anteversion to match native anatomy.  It had fantastic purchase, but I then transfixed with 2  additional dome screws, both with excellent purchase since I was in a constrained system and I wanted to make sure we had excellent stability of the cup.  The constrained liner, which is a size, was then impacted into the 54 mm acetabular component and  was found to be locked into position circumferentially.  A 28+3 head was then placed on the femoral neck and it was then reduced into the liner.  There was  range of motion with flexion to 120 then at 70 degrees flexion, we had 40 degrees adduction and 90  degrees internal rotation with excellent stability and no impingement and 90 degrees of flexion.  I was able to rotate in 70 degrees, again with no impingement or any laxity.  The right knee is placed on top of the left and the leg lengths were found to  be equal.  The wound was then copiously irrigated with saline solution and posterior tissues were reattached to the femur.   Irrisept I was then irrigated into the wound and left in for 2 minutes and I suctioned out any remainder of the irrigant.  The  fascia lata was then closed over a Hemovac drain with a running 0 Stratafix suture.  Subcutaneous was closed with interrupted 2-0 Vicryl. subcuticular running 4-0 Monocryl.  Incision is cleaned and dried and Steri-Strips and a bulky sterile dressing are  applied.  Prior to closure of the subcu 30 mL of 0.25% Marcaine were injected in the subcutaneous tissues.  Once everything was closed, Steri-Strips and a bulky sterile dressing are applied.  He was awakened and transported to recovery in stable condition.  Note that a surgical assistant was a medical necessity for this procedure to do it in a safe and expeditious manner.  Surgical assistance was necessary for retraction of vital neurovascular structures and also for proper positioning of the limb for safe  removal of the old implant and for safe and accurate placement of the new implant.  AN/NUANCE  D:08/25/2018 T:08/25/2018 JOB:[id number]

## 2018-08-25 NOTE — Progress Notes (Signed)
Pt transported to room aith 2 bags and hearing sids in ears.

## 2018-08-26 ENCOUNTER — Encounter (HOSPITAL_COMMUNITY): Payer: Self-pay | Admitting: Orthopedic Surgery

## 2018-08-26 LAB — BASIC METABOLIC PANEL
Anion gap: 6 (ref 5–15)
BUN: 18 mg/dL (ref 8–23)
CO2: 24 mmol/L (ref 22–32)
Calcium: 8.4 mg/dL — ABNORMAL LOW (ref 8.9–10.3)
Chloride: 109 mmol/L (ref 98–111)
Creatinine, Ser: 1.13 mg/dL (ref 0.61–1.24)
GFR calc Af Amer: >60 mL/min (ref >60)
GFR calc non Af Amer: >60 mL/min (ref >60)
Glucose, Bld: 154 mg/dL — ABNORMAL HIGH (ref 70–99)
Potassium: 3.8 mmol/L (ref 3.5–5.1)
Sodium: 139 mmol/L (ref 135–145)

## 2018-08-26 LAB — CBC
HCT: 30.3 % — ABNORMAL LOW (ref 39.0–52.0)
Hemoglobin: 9.5 g/dL — ABNORMAL LOW (ref 13.0–17.0)
MCH: 30.4 pg (ref 26.0–34.0)
MCHC: 31.4 g/dL (ref 30.0–36.0)
MCV: 96.8 fL (ref 80.0–100.0)
Platelets: 125 10*3/uL — ABNORMAL LOW (ref 150–400)
RBC: 3.13 MIL/uL — ABNORMAL LOW (ref 4.22–5.81)
RDW: 13.4 % (ref 11.5–15.5)
WBC: 6.5 10*3/uL (ref 4.0–10.5)
nRBC: 0 % (ref 0.0–0.2)

## 2018-08-26 LAB — GLUCOSE, CAPILLARY
Glucose-Capillary: 146 mg/dL — ABNORMAL HIGH (ref 70–99)
Glucose-Capillary: 232 mg/dL — ABNORMAL HIGH (ref 70–99)

## 2018-08-26 MED ORDER — ASPIRIN 325 MG PO TBEC: 325.0000 mg | DELAYED_RELEASE_TABLET | Freq: Two times a day (BID) | ORAL | 0 refills | Status: AC

## 2018-08-26 MED ORDER — HYDROCODONE-ACETAMINOPHEN 5-325 MG PO TABS: 1.0000 | ORAL_TABLET | Freq: Four times a day (QID) | ORAL | 0 refills | Status: DC | PRN

## 2018-08-26 MED ORDER — METHOCARBAMOL 500 MG PO TABS: 500.0000 mg | ORAL_TABLET | Freq: Four times a day (QID) | ORAL | 0 refills | Status: DC | PRN

## 2018-08-26 MED ORDER — TRAMADOL HCL 50 MG PO TABS: 50.0000 mg | ORAL_TABLET | Freq: Four times a day (QID) | ORAL | 0 refills | Status: AC | PRN

## 2018-08-26 MED ORDER — PROPOFOL 10 MG/ML IV BOLUS
INTRAVENOUS | Status: AC
  Filled 2018-08-26: qty 40

## 2018-08-26 MED ORDER — SUCCINYLCHOLINE CHLORIDE 200 MG/10ML IV SOSY
PREFILLED_SYRINGE | INTRAVENOUS | Status: AC
  Filled 2018-08-26: qty 10

## 2018-08-26 MED ORDER — LIDOCAINE 2% (20 MG/ML) 5 ML SYRINGE
INTRAMUSCULAR | Status: AC
  Filled 2018-08-26: qty 5

## 2018-08-26 NOTE — Evaluation (Signed)
Occupational Therapy Evaluation Patient Details Name: Lance OdorGerald Hickok MRN: [id number] DOB: 12/08/1945 Today's Date: 08/26/2018    History of Present Illness Pt is a 73 year old male s/p Right acetabular hip revision due to failed total hip arthroplasty with dislocation.  PMhx of R THR femoral head and abductor tendon repair (Feb 2019)   Clinical Impression   Pt seen for OT evaluation.  Reviewed THPS and ADLs.  Pt did not recall them from earlier session with PT.  Handout on tray. He has had them in the past, but states he did not use his AE for adls. Encouraged him to have wife assist, as he did not want to review use of this for safe completion of ADLs.  He has a BSC at home and walk in shower. Pt also declined practicing shower. Handout was given. Pt plans home today    Follow Up Recommendations  Supervision/Assistance - 24 hour    Equipment Recommendations  None recommended by OT    Recommendations for Other Services       Precautions / Restrictions Precautions Precautions: Fall;Posterior Hip Precaution Comments: reviewed posterior THPS; pt initially recalled 0/3.   Restrictions Other Position/Activity Restrictions: WBAT      Mobility Bed Mobility by PT Overal bed mobility: Needs Assistance Bed Mobility: Supine to Sit     Supine to sit: Supervision     General bed mobility comments: oob  Transfers Overall transfer level: Needs assistance Equipment used: Rolling walker (2 wheeled) Transfers: Sit to/from Stand Sit to Stand: Min guard         General transfer comment: for safety. Pt correctly placed UEs/LEs without cues    Balance                                           ADL either performed or assessed with clinical judgement   ADL Overall ADL's : Needs assistance/impaired Eating/Feeding: Independent   Grooming: Min guard;Standing   Upper Body Bathing: Supervision/ safety;Sitting   Lower Body Bathing: Moderate assistance;Sit to/from  stand   Upper Body Dressing : Supervision/safety;Sitting   Lower Body Dressing: Maximal assistance;Sit to/from stand   Toilet Transfer: Minimal assistance;Ambulation;BSC;RW   Toileting- ArchitectClothing Manipulation and Hygiene: Min guard;Sit to/from stand         General ADL Comments: adls based on clinical judgment.  Pt did not want to get dressed yet. He is planning d/c home today. Reviewed precautions. Pt has AE but states he has never used it for adls. He will plan to have his wife assist him..  Pt states he usually gets into shower, going forward and holding to grab bar. He did not want to practice this prior to home.  Also educated on using RW for support and backing in.  Handout given     Vision         Perception     Praxis      Pertinent Vitals/Pain Pain Assessment: 0-10 Pain Score: 2  Pain Location: incision site Pain Descriptors / Indicators: Sore Pain Intervention(s): Limited activity within patient's tolerance;Monitored during session;Repositioned;Ice applied     Hand Dominance     Extremity/Trunk Assessment Upper Extremity Assessment Upper Extremity Assessment: Overall WFL for tasks assessed   Lower Extremity Assessment Lower Extremity Assessment: RLE deficits/detail RLE Deficits / Details: anticipated post op hip weakness, able to perform ankle pumps, good quad set  Communication Communication Communication: No difficulties   Cognition Arousal/Alertness: Awake/alert Behavior During Therapy: WFL for tasks assessed/performed Overall Cognitive Status: Within Functional Limits for tasks assessed                                 General Comments: no recall of THPS reviewed by PT earlier today.  He has had them in the past   General Comments       Exercises    Shoulder Instructions      Home Living Family/patient expects to be discharged to:: Private residence Living Arrangements: Spouse/significant other   Type of Home: House Home  Access: Stairs to enter [employer] of Steps: 2   Home Layout: One level;Laundry or work area in basement     [employer]: Producer, television/film/video: Handicapped height     Home Equipment: Bedside commode;Shower seat - built in          Prior Functioning/Environment Level of Independence: Independent        Comments: Pt has AE but states he never used it for adls        OT Problem List:        OT Treatment/Interventions:      OT Goals(Current goals can be found in the care plan section) Acute Rehab OT Goals Patient Stated Goal: home this pm OT Goal Formulation: All assessment and education complete, DC therapy  OT Frequency:     Barriers to D/C:            Co-evaluation              AM-PAC OT "6 Clicks" Daily Activity     Outcome Measure Help from another person eating meals?: None Help from another person taking care of personal grooming?: A Little Help from another person toileting, which includes using toliet, bedpan, or urinal?: A Little Help from another person bathing (including washing, rinsing, drying)?: A Lot Help from another person to put on and taking off regular upper body clothing?: A Little Help from another person to put on and taking off regular lower body clothing?: A Lot 6 Click Score: 17   End of Session    Activity Tolerance: Patient tolerated treatment well Patient left: in chair;with call bell/phone within reach  OT Visit Diagnosis: Pain Pain - Right/Left: Right Pain - part of body: Hip                Time: 7622-6333 OT Time Calculation (min): 14 min Charges:  OT General Charges $OT Visit: 1 Visit OT Evaluation $OT Eval Low Complexity: 1 Low  Marica Otter, OTR/L Acute Rehabilitation Services [phone] [facility] pager [phone] office 08/26/2018  Caillou Minus 08/26/2018, 1:55 PM

## 2018-08-26 NOTE — Progress Notes (Signed)
Discharge instructions discussed with patient, including medication changes, hip precautions/exercise, verbalized agreement and understanding

## 2018-08-26 NOTE — Progress Notes (Signed)
Discharge Plan: Follow surgeon's recommendation for DC plan and follow-up therapies(no f/u PT, HEP planned) No SW/CM needs identified.   Vivi Barrack, [place], MSW Clinical Social Worker  [phone] 08/26/2018  1:19 PM

## 2018-08-26 NOTE — Progress Notes (Signed)
Physical Therapy Treatment Patient Details Name: Lance OdorGerald Hill MRN: [id number] DOB: 12/12/1945 Today's Date: 08/26/2018    History of Present Illness Pt is a 73 year old male s/p Right acetabular hip revision due to failed total hip arthroplasty with dislocation.  PMhx of R THR femoral head and abductor tendon repair (Feb 2019)    PT Comments    Pt ambulated in hallway, practiced stairs and performed standing exercises.  Reviewed posterior hip precautions in detail again with pt. Pt provided with HEP handout.  Pt had no further questions and feels ready for d/c home today.   Follow Up Recommendations  Follow surgeon's recommendation for DC plan and follow-up therapies(no f/u PT, HEP planned)     Equipment Recommendations  None recommended by PT    Recommendations for Other Services       Precautions / Restrictions Precautions Precautions: Fall;Posterior Hip Precaution Comments: reviewed posterior hip precautions and again demonstrated Restrictions Other Position/Activity Restrictions: WBAT    Mobility  Bed Mobility Overal bed mobility: Needs Assistance Bed Mobility: Supine to Sit     Supine to sit: Supervision     General bed mobility comments: pt up in recliner  Transfers Overall transfer level: Needs assistance Equipment used: Rolling walker (2 wheeled) Transfers: Sit to/from Stand Sit to Stand: Supervision         General transfer comment: recalled correct UE and LE positioning  Ambulation/Gait Ambulation/Gait assistance: Supervision Gait Distance (Feet): 400 Feet Assistive device: Rolling walker (2 wheeled) Gait Pattern/deviations: Step-to pattern;Decreased stance time - right;Antalgic     General Gait Details: verbal cues for RW positioning, maintaining precautions   Stairs Stairs: Yes Stairs assistance: Min guard Stair Management: Step to pattern;Forwards;Two rails Number of Stairs: 2 General stair comments: able to recall correct sequence,  min/guard for safety   Wheelchair Mobility    Modified Rankin (Stroke Patients Only)       Balance                                            Cognition Arousal/Alertness: Awake/alert Behavior During Therapy: WFL for tasks assessed/performed Overall Cognitive Status: Within Functional Limits for tasks assessed                                 General Comments: no recall of THPS reviewed by PT earlier today.  He has had them in the past      Exercises Total Joint Exercises Ankle Circles/Pumps: AROM;10 reps;Both Quad Sets: AROM;10 reps;Right Heel Slides: AAROM;10 reps;Right Hip ABduction/ADduction: AROM;10 reps;Standing;Right Long Arc Quad: AROM;Right;10 reps;Seated Knee Flexion: AROM;Standing;10 reps;Right Marching in Standing: AROM;10 reps;Right;Standing Standing Hip Extension: AROM;10 reps;Right;Standing    General Comments        Pertinent Vitals/Pain Pain Assessment: 0-10 Pain Score: 2  Pain Location: incision site Pain Descriptors / Indicators: Sore Pain Intervention(s): Monitored during session;Repositioned    Home Living Family/patient expects to be discharged to:: Private residence Living Arrangements: Spouse/significant other           Home Equipment: Bedside commode;Shower seat - built in      Prior Function Level of Independence: Independent      Comments: Pt has AE but states he never used it for adls   PT Goals (current goals can now be found in the care plan section) Acute Rehab  PT Goals Patient Stated Goal: home this pm PT Goal Formulation: With patient Time For Goal Achievement: 08/30/18 Potential to Achieve Goals: Good Progress towards PT goals: Progressing toward goals    Frequency    7X/week      PT Plan Current plan remains appropriate    Co-evaluation              AM-PAC PT "6 Clicks" Mobility   Outcome Measure  Help needed turning from your back to your side while in a flat bed  without using bedrails?: A Little Help needed moving from lying on your back to sitting on the side of a flat bed without using bedrails?: A Little Help needed moving to and from a bed to a chair (including a wheelchair)?: A Little Help needed standing up from a chair using your arms (e.g., wheelchair or bedside chair)?: A Little Help needed to walk in hospital room?: A Little Help needed climbing 3-5 steps with a railing? : A Little 6 Click Score: 18    End of Session Equipment Utilized During Treatment: Gait belt Activity Tolerance: Patient tolerated treatment well Patient left: in chair;with call bell/phone within reach Nurse Communication: Mobility status PT Visit Diagnosis: Other abnormalities of gait and mobility (R26.89)     Time: 1337-1400 PT Time Calculation (min) (ACUTE ONLY): 23 min  Charges:  $Gait Training: 8-22 mins $Therapeutic Exercise: 8-22 mins                     Zenovia Jarred, PT, DPT Acute Rehabilitation Services Office: [phone] Pager: [phone]  Sarajane Jews 08/26/2018, 3:14 PM

## 2018-08-26 NOTE — Progress Notes (Signed)
   Subjective: 1 Day Post-Op Procedure(s) (LRB): Right acetabular revision (Right) Patient reports pain as mild.   We will start therapy today.  Plan is to go Home after hospital stay.  Objective: Vital signs in last 24 hours: Temp:  [97.5 F (36.4 C)-98.8 F (37.1 C)] 98.4 F (36.9 C) (04/28 0534) Pulse Rate:  [67-83] 83 (04/28 0534) Resp:  [11-18] 14 (04/28 0534) BP: (77-153)/(60-79) 132/70 (04/28 0534) SpO2:  [95 %-100 %] 100 % (04/28 0534) Weight:  [89.5 kg] 89.5 kg (04/27 1330)  Intake/Output from previous day:  Intake/Output Summary (Last 24 hours) at 08/26/2018 0743 Last data filed at 08/26/2018 0741 Gross per 24 hour  Intake 3631.16 ml  Output 3157 ml  Net 474.16 ml    Intake/Output this shift: Total I/O In: -  Out: 200 [Urine:200]  Labs: Recent Labs    08/26/18 0431  HGB 9.5*   Recent Labs    08/26/18 0431  WBC 6.5  RBC 3.13*  HCT 30.3*  PLT 125*   Recent Labs    08/26/18 0431  NA 139  K 3.8  CL 109  CO2 24  BUN 18  CREATININE 1.13  GLUCOSE 154*  CALCIUM 8.4*   No results for input(s): LABPT, INR in the last 72 hours.  EXAM General - Patient is Alert, Appropriate and Oriented Extremity - Neurologically intact Neurovascular intact No cellulitis present Compartment soft Dressing - dressing C/D/I Motor Function - intact, moving foot and toes well on exam.  Hemovac pulled without difficulty.  Past Medical History:  Diagnosis Date  . Arthritis   . Diabetes mellitus without complication (HCC)    type II   . GERD (gastroesophageal reflux disease)   . History of kidney stones    hx of   . Hypertension     Assessment/Plan: 1 Day Post-Op Procedure(s) (LRB): Right acetabular revision (Right) Principal Problem:   Failed total hip arthroplasty, subsequent encounter Active Problems:   Failed total hip arthroplasty with dislocation (HCC)   Advance diet Up with therapy D/C IV fluids  Discharge home later today as long as he does  well with PT. Was able to ambulate to bathroom last night with nursing staff  DVT Prophylaxis - Aspirin Weight Bearing As Tolerated rght Leg Hemovac Pulled Begin Therapy  Ollen Gross

## 2018-08-26 NOTE — Evaluation (Signed)
Physical Therapy Evaluation Patient Details Name: Lance Hill MRN: [id number] DOB: 01/11/1946 Today's Date: 08/26/2018   History of Present Illness  Pt is a 73 year old male s/p Right acetabular hip revision due to failed total hip arthroplasty with dislocation.  PMhx of R THR femoral head and abductor tendon repair (Feb 2019)  Clinical Impression  Patient is s/p above surgery resulting in functional limitations due to the deficits listed below (see PT Problem List).  Patient will benefit from skilled PT to increase their independence and safety with mobility to allow discharge to the venue listed below.  Pt assisted with ambulating with RW.  Increased time spent educating pt on posterior hip precautions during mobility and exercises.  Pt anticipates d/c home with HEP f/u later this afternoon.       Follow Up Recommendations Follow surgeon's recommendation for DC plan and follow-up therapies(no f/u PT, HEP planned)    Equipment Recommendations  None recommended by PT    Recommendations for Other Services       Precautions / Restrictions Precautions Precautions: Fall;Posterior Hip Precaution Comments: provided posterior hip handout, demonstrated and reviewed hip precautions esp during mobility tasks Restrictions Other Position/Activity Restrictions: WBAT      Mobility  Bed Mobility Overal bed mobility: Needs Assistance Bed Mobility: Supine to Sit     Supine to sit: Supervision     General bed mobility comments: cues for precautions  Transfers Overall transfer level: Needs assistance Equipment used: Rolling walker (2 wheeled) Transfers: Sit to/from Stand Sit to Stand: Min guard         General transfer comment: verbal and visual cues for maintaining hip precautions  Ambulation/Gait Ambulation/Gait assistance: Min guard Gait Distance (Feet): 160 Feet Assistive device: Rolling walker (2 wheeled) Gait Pattern/deviations: Step-to pattern;Decreased stance time -  right;Antalgic     General Gait Details: verbal cues for sequence, RW positioning, posture, maintaining precautions  Stairs            Wheelchair Mobility    Modified Rankin (Stroke Patients Only)       Balance                                             Pertinent Vitals/Pain Pain Assessment: 0-10 Pain Score: 4  Pain Location: incision site Pain Descriptors / Indicators: Nagging;Discomfort Pain Intervention(s): Monitored during session;Repositioned;Limited activity within patient's tolerance    Home Living Family/patient expects to be discharged to:: Private residence Living Arrangements: Spouse/significant other   Type of Home: House Home Access: Stairs to enter   [employer] CorporationEntrance Stairs-Number of Steps: 2 Home Layout: One level;Laundry or work area in Pitney Bowesbasement Home Equipment: Environmental consultantWalker - 2 wheels;Cane - single point      Prior Function Level of Independence: Independent               Hand Dominance        Extremity/Trunk Assessment        Lower Extremity Assessment Lower Extremity Assessment: RLE deficits/detail RLE Deficits / Details: anticipated post op hip weakness, able to perform ankle pumps, good quad set       Communication   Communication: No difficulties  Cognition Arousal/Alertness: Awake/alert Behavior During Therapy: WFL for tasks assessed/performed Overall Cognitive Status: Within Functional Limits for tasks assessed  General Comments      Exercises Total Joint Exercises Ankle Circles/Pumps: AROM;10 reps;Both Quad Sets: AROM;10 reps;Right Heel Slides: AAROM;10 reps;Right Hip ABduction/ADduction: AROM;10 reps;Supine Long Arc Quad: AROM;Right;10 reps;Seated   Assessment/Plan    PT Assessment Patient needs continued PT services  PT Problem List Decreased strength;Decreased mobility;Decreased knowledge of precautions;Decreased knowledge of use of  DME;Pain       PT Treatment Interventions Stair training;Gait training;Therapeutic activities;Neuromuscular re-education;Therapeutic exercise;Functional mobility training;DME instruction;Balance training;Patient/family education    PT Goals (Current goals can be found in the Care Plan section)  Acute Rehab PT Goals PT Goal Formulation: With patient Time For Goal Achievement: 08/30/18 Potential to Achieve Goals: Good    Frequency 7X/week   Barriers to discharge        Co-evaluation               AM-PAC PT "6 Clicks" Mobility  Outcome Measure Help needed turning from your back to your side while in a flat bed without using bedrails?: A Little Help needed moving from lying on your back to sitting on the side of a flat bed without using bedrails?: A Little Help needed moving to and from a bed to a chair (including a wheelchair)?: A Little Help needed standing up from a chair using your arms (e.g., wheelchair or bedside chair)?: A Little Help needed to walk in hospital room?: A Little Help needed climbing 3-5 steps with a railing? : A Little 6 Click Score: 18    End of Session Equipment Utilized During Treatment: Gait belt Activity Tolerance: Patient tolerated treatment well Patient left: in chair;with call bell/phone within reach Nurse Communication: Mobility status PT Visit Diagnosis: Other abnormalities of gait and mobility (R26.89)    Time: 0932-3557 PT Time Calculation (min) (ACUTE ONLY): 32 min   Charges:   PT Evaluation $PT Eval Low Complexity: 1 Low PT Treatments $Therapeutic Exercise: 8-22 mins      Zenovia Jarred, PT, DPT Acute Rehabilitation Services Office: [phone] Pager: [phone]  Sarajane Jews 08/26/2018, 12:19 PM

## 2018-08-28 NOTE — Discharge Summary (Signed)
Physician Discharge Summary   Patient ID: Lance Hill MRN: [id number] DOB/AGE: Jun 13, 1945 73 y.o.  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 Arthritis   . Diabetes mellitus without complication (HCC)    type II   . GERD (gastroesophageal reflux disease)   . History of kidney stones    hx of   . Hypertension    Discharge Diagnoses:   Principal Problem:   Failed total hip arthroplasty, subsequent encounter Active Problems:   Failed total hip arthroplasty with dislocation (HCC)  Estimated body mass index is 29.99 kg/m as calculated from the following:   Height as of this encounter:  (1.727 m).   Weight as of this encounter: 89.5 kg.  Procedure:  Procedure(s) (LRB): Right acetabular revision (Right)   Consults: None  HPI: The patient is a 73 year old male, had a right total hip arthroplasty performed at an outside institution several years ago.  He has had multiple dislocations recently.  He presents now for a right constrained liner versus acetabular versus total hip arthroplasty revision.  Laboratory Data: Admission on 08/25/2018, Discharged on 08/26/2018  Component Date Value Ref Range Status  . Glucose-Capillary 08/25/2018 147* 70 - 99 mg/dL Final  . Comment 1 78/46/9629 Notify RN   Final  . Comment 2 08/25/2018 Document in Chart   Final  . Glucose-Capillary 08/25/2018 157* 70 - 99 mg/dL Final  . Hgb B2W MFr Bld 08/25/2018 6.5* 4.8 - 5.6 % Final   Comment: (NOTE) Pre diabetes:          5.7%-6.4% Diabetes:              >6.4% Glycemic control for   <7.0% adults with diabetes   . Mean Plasma Glucose 08/25/2018 139.85  mg/dL Final   Performed at [facility], [address], [place], [place] [address]  . WBC 08/26/2018 6.5  4.0 - 10.5 K/uL Final  . RBC 08/26/2018 3.13* 4.22 - 5.81 MIL/uL Final   . Hemoglobin 08/26/2018 9.5* 13.0 - 17.0 g/dL Final  . HCT 40/01/2724 30.3* 39.0 - 52.0 % Final  . MCV 08/26/2018 96.8  80.0 - 100.0 fL Final  . MCH 08/26/2018 30.4  26.0 - 34.0 pg Final  . MCHC 08/26/2018 31.4  30.0 - 36.0 g/dL Final  . RDW 36/64/4034 13.4  11.5 - 15.5 % Final  . Platelets 08/26/2018 125* 150 - 400 K/uL Final  . nRBC 08/26/2018 0.0  0.0 - 0.2 % Final   Performed at [facility], [address] [place], [place] [address]  . Sodium 08/26/2018 139  135 - 145 mmol/L Final  . Potassium 08/26/2018 3.8  3.5 - 5.1 mmol/L Final  . Chloride 08/26/2018 109  98 - 111 mmol/L Final  . CO2 08/26/2018 24  22 - 32 mmol/L Final  . Glucose, Bld 08/26/2018 154* 70 - 99 mg/dL Final  . BUN 56/38/7564 18  8 - 23 mg/dL Final  . Creatinine, Ser 08/26/2018 1.13  0.61 - 1.24 mg/dL Final  . Calcium 33/29/5188 8.4* 8.9 - 10.3 mg/dL Final  . GFR calc non Af Amer 08/26/2018 >60  >60 mL/min Final  . GFR calc Af Amer 08/26/2018 >60  >60 mL/min Final  . Anion gap 08/26/2018 6  5 - 15 Final   Performed at [facility], [address], [place], [place] [address]  .  Glucose-Capillary 08/25/2018 227* 70 - 99 mg/dL Final  . Glucose-Capillary 08/25/2018 171* 70 - 99 mg/dL Final  . Glucose-Capillary 08/26/2018 146* 70 - 99 mg/dL Final  . Glucose-Capillary 08/26/2018 232* 70 - 99 mg/dL Final  Hospital Outpatient Visit on 08/21/2018  Component Date Value Ref Range Status  . aPTT 08/21/2018 32  24 - 36 seconds Final   Performed at [facility], [address], [place], [place] [address]  . WBC 08/21/2018 6.6  4.0 - 10.5 K/uL Final  . RBC 08/21/2018 3.89* 4.22 - 5.81 MIL/uL Final  . Hemoglobin 08/21/2018 11.8* 13.0 - 17.0 g/dL Final  . HCT 78/29/5621 37.5* 39.0 - 52.0 % Final  . MCV 08/21/2018 96.4  80.0 - 100.0 fL Final  . MCH 08/21/2018 30.3  26.0 - 34.0 pg Final  . MCHC 08/21/2018 31.5  30.0 - 36.0 g/dL Final  . RDW 30/86/5784 13.4  11.5 - 15.5  % Final  . Platelets 08/21/2018 167  150 - 400 K/uL Final  . nRBC 08/21/2018 0.0  0.0 - 0.2 % Final   Performed at [facility], [address] [place], [place] [address]  . Sodium 08/21/2018 141  135 - 145 mmol/L Final  . Potassium 08/21/2018 5.1  3.5 - 5.1 mmol/L Final  . Chloride 08/21/2018 108  98 - 111 mmol/L Final  . CO2 08/21/2018 26  22 - 32 mmol/L Final  . Glucose, Bld 08/21/2018 124* 70 - 99 mg/dL Final  . BUN 52/84/1324 40* 8 - 23 mg/dL Final  . Creatinine, Ser 08/21/2018 1.42* 0.61 - 1.24 mg/dL Final  . Calcium 40/01/2724 9.4  8.9 - 10.3 mg/dL Final  . Total Protein 08/21/2018 7.3  6.5 - 8.1 g/dL Final  . Albumin 36/64/4034 4.3  3.5 - 5.0 g/dL Final  . AST 74/25/9563 22  15 - 41 U/L Final  . ALT 08/21/2018 24  0 - 44 U/L Final  . Alkaline Phosphatase 08/21/2018 55  38 - 126 U/L Final  . Total Bilirubin 08/21/2018 0.9  0.3 - 1.2 mg/dL Final  . GFR calc non Af Amer 08/21/2018 48* >60 mL/min Final  . GFR calc Af Amer 08/21/2018 56* >60 mL/min Final  . Anion gap 08/21/2018 7  5 - 15 Final   Performed at [facility], [address], [place], [place] [address]  . Prothrombin Time 08/21/2018 13.5  11.4 - 15.2 seconds Final  . INR 08/21/2018 1.0  0.8 - 1.2 Final   Comment: (NOTE) INR goal varies based on device and disease states. Performed at [facility], [address], [place], [place] [address]   . ABO/RH(D) 08/21/2018 O POS   Final  . Antibody Screen 08/21/2018 NEG   Final  . Sample Expiration 08/21/2018 08/28/2018   Final  . Extend sample reason 08/21/2018    Final                   Value:NO TRANSFUSIONS OR PREGNANCY IN THE PAST 3 MONTHS Performed at [facility], [address], [place], [place] [address]   . Hgb A1c MFr Bld 08/21/2018 6.6* 4.8 - 5.6 % Final   Comment: (NOTE) Pre diabetes:          5.7%-6.4% Diabetes:              >6.4% Glycemic control for   <7.0% adults with diabetes    . Mean Plasma Glucose 08/21/2018 142.72  mg/dL Final   Performed  at [facility], [address] [place], [place] [address]  . MRSA, PCR 08/21/2018 NEGATIVE  NEGATIVE Final  . Staphylococcus aureus 08/21/2018 NEGATIVE  NEGATIVE Final   Comment: (NOTE) The Xpert SA Assay (FDA approved for NASAL specimens in patients 75 years of age and older), is one component of a comprehensive surveillance program. It is not intended to diagnose infection nor to guide or monitor treatment. Performed at [facility], [address] [place], [place] [address]   . Glucose-Capillary 08/21/2018 116* 70 - 99 mg/dL Final     X-Rays:Dg Pelvis Portable  Result Date: 08/25/2018 CLINICAL DATA:  Right acetabular revision. EXAM: PORTABLE PELVIS 1-2 VIEWS COMPARISON:  08/14/2018 FINDINGS: Revision of the right total hip arthroplasty. The right hip appears located on this single view. Again noted are anchor screws in the right trochanteric region. There is a surgical drain present. The entire right femoral prosthesis is evaluated. No evidence for a periprosthetic fracture. Pelvic bony ring is intact. IMPRESSION: Right hip arthroplasty revision.  Expected postsurgical changes. Electronically Signed   By: Richarda OverlieAdam  Henn M.D.   On: 08/25/2018 14:01   Dg Hip Port Unilat W Or Wo Pelvis 1 View Right  Result Date: 08/15/2018 CLINICAL DATA:  73 y/o  M; reduction of hip dislocation. EXAM: DG HIP (WITH OR WITHOUT PELVIS) 1V PORT RIGHT COMPARISON:  08/14/2018 pelvis and right hip radiographs FINDINGS: Interval reduction of right hip prosthesis dislocation in good alignment on this single frontal view. No acute fracture identified. IMPRESSION: Interval reduction of right hip prosthesis in good alignment. Electronically Signed   By: Mitzi HansenLance  Furusawa-Stratton M.D.   On: 08/15/2018 00:51   Dg Hip Unilat W Or Wo Pelvis 2-3 Views Right  Result Date: 08/15/2018 CLINICAL DATA:  73 y/o M; right hip pain and right leg  shortening. History of recurrent dislocations. EXAM: DG HIP (WITH OR WITHOUT PELVIS) 2-3V RIGHT COMPARISON:  05/06/2018 right lower extremity radiographs FINDINGS: Superior dislocation the right hip prosthesis. No acute fracture identified. Left hip joint is maintained. No pelvic fracture or diastasis is evident. IMPRESSION: Superior dislocation of right hip prosthesis. No acute fracture identified. Electronically Signed   By: Mitzi HansenLance  Furusawa-Stratton M.D.   On: 08/15/2018 00:12    EKG: Orders placed or performed during the hospital encounter of 08/21/18  . EKG 12-Lead  . EKG 12-Lead  . EKG 12-Lead  . EKG 12-Lead     Hospital Course: Lance OdorGerald Bogdanski is a 73 y.o. who was [facility] to [facility]. They were brought to the operating room on 08/25/2018 and underwent Procedure(s): Right acetabular revision.  Patient tolerated the procedure well and was later transferred to the recovery room and then to the orthopaedic floor for postoperative care. They were given PO and IV analgesics for pain control following their surgery. They were given 24 hours of postoperative antibiotics of  Anti-infectives (From admission, onward)   Start     Dose/Rate Route Frequency Ordered Stop   08/25/18 1600  ceFAZolin (ANCEF) IVPB 2g/100 mL premix     2 g 200 mL/hr over 30 Minutes Intravenous Every 6 hours 08/25/18 1328 08/25/18 2215   08/25/18 0745  ceFAZolin (ANCEF) IVPB 2g/100 mL premix     2 g 200 mL/hr over 30 Minutes Intravenous On call to O.R. 08/25/18 [id number] 08/25/18 0948     and started on DVT prophylaxis in the form of Aspirin.   PT and OT were ordered for total joint protocol. Discharge planning consulted to help  with postop disposition and equipment needs.  Patient had a good night on the evening of surgery. They started to get up OOB with therapy on POD #1. Pt was seen during rounds and was ready to go home pending progress with therapy. Hemovac drain was pulled without difficulty. He worked with therapy  on POD #1 and was meeting his goals. Pt was discharged to home later that day in stable condition.  Diet: Diabetic diet Activity: WBAT Follow-up: in 2 weeks Disposition: Home Discharged Condition: stable   Discharge Instructions    Call MD / Call 911   Complete by:  As directed    If you experience chest pain or shortness of breath, CALL 911 and be transported to the hospital emergency room.  If you develope a fever above 101 F, pus (white drainage) or increased drainage or redness at the wound, or calf pain, call your surgeon's office.   Change dressing   Complete by:  As directed    You may change your dressing on Wednesday, then change the dressing daily with sterile 4 x 4 inch gauze dressing and paper tape.   Constipation Prevention   Complete by:  As directed    Drink plenty of fluids.  Prune juice may be helpful.  You may use a stool softener, such as Colace (over the counter) 100 mg twice a day.  Use MiraLax (over the counter) for constipation as needed.   Diet - low sodium heart healthy   Complete by:  As directed    Follow the hip precautions as taught in Physical Therapy   Complete by:  As directed    Weight bearing as tolerated   Complete by:  As directed      Allergies as of 08/26/2018   No Known Allergies     Medication List    TAKE these medications   acetaminophen 325 MG tablet Commonly known as:  TYLENOL Take 650 mg by mouth every 6 (six) hours as needed for moderate pain.   amLODipine 10 MG tablet Commonly known as:  NORVASC Take 10 mg by mouth at bedtime.   ARTIFICIAL TEAR OP Place 1 drop into both eyes daily as needed (for dry eyes).   aspirin 325 MG EC tablet Take 1 tablet (325 mg total) by mouth 2 (two) times daily for 21 days. Then resume one 81 mg aspirin once a day. What changed:    medication strength  how much to take  when to take this  additional instructions   Biofreeze 4 % Gel Generic drug:  Menthol (Topical Analgesic) Apply 1  application topically daily as needed (pain).   Brinzolamide-Brimonidine 1-0.2 % Susp Place 1 drop into both eyes 3 (three) times daily. SIMBRINZA   [employer] Vitamin D3 25 MCG (1000 UT) capsule Generic drug:  Cholecalciferol Take 2,000 Units by mouth daily.   Fish Oil 1200 MG Caps Take 1,200 mg by mouth at bedtime.   gabapentin 300 MG capsule Commonly known as:  NEURONTIN Take 600 mg by mouth 2 (two) times daily.   glyBURIDE 5 MG tablet Commonly known as:  DIABETA Take 5 mg by mouth 2 (two) times daily with a meal.   hydrochlorothiazide 25 MG tablet Commonly known as:  HYDRODIURIL Take 12.5 mg by mouth daily.   HYDROcodone-acetaminophen 5-325 MG tablet Commonly known as:  NORCO/VICODIN Take 1-2 tablets by mouth every 6 (six) hours as needed for severe pain.   loratadine 10 MG tablet Commonly known as:  CLARITIN Take 10  mg by mouth daily.   losartan 100 MG tablet Commonly known as:  COZAAR Take 100 mg by mouth daily.   magnesium oxide 400 MG tablet Commonly known as:  MAG-OX Take 400 mg by mouth daily.   metFORMIN 500 MG tablet Commonly known as:  GLUCOPHAGE Take 500 mg by mouth 2 (two) times daily with a meal.   methocarbamol 500 MG tablet Commonly known as:  ROBAXIN Take 1 tablet (500 mg total) by mouth every 6 (six) hours as needed for muscle spasms.   multivitamin with minerals Tabs tablet Take 1 tablet by mouth daily.   OVER THE COUNTER MEDICATION Place 1 Dose under the tongue daily. Hemp Flower Extract 500 mg drops. 1/2 dropperful daily   pioglitazone 45 MG tablet Commonly known as:  ACTOS Take 45 mg by mouth daily.   ranitidine 300 MG tablet Commonly known as:  ZANTAC Take 300 mg by mouth 2 (two) times daily.   simvastatin 20 MG tablet Commonly known as:  ZOCOR Take 20 mg by mouth at bedtime.   traMADol 50 MG tablet Commonly known as:  ULTRAM Take 1-2 tablets (50-100 mg total) by mouth every 6 (six) hours as needed for moderate pain (If Vicodin  alone ineffective). What changed:  reasons to take this   trolamine salicylate 10 % cream Commonly known as:  ASPERCREME Apply 1 application topically as needed for muscle pain.            Discharge Care Instructions  (From admission, onward)         Start     Ordered   08/26/18 0000  Weight bearing as tolerated     08/26/18 0801   08/26/18 0000  Change dressing    Comments:  You may change your dressing on Wednesday, then change the dressing daily with sterile 4 x 4 inch gauze dressing and paper tape.   08/26/18 0801         Follow-up Information    Ollen Gross, MD. Schedule an appointment as soon as possible for a visit on 09/09/2018.   Specialty:  Orthopedic Surgery Contact information: [address] [place] [address] [place] [address] [phone]           Signed: Arther Abbott, PA-C Orthopedic Surgery 08/28/2018, 8:15 AM

## 2018-09-02 ENCOUNTER — Encounter (HOSPITAL_COMMUNITY): Payer: Self-pay | Admitting: Orthopedic Surgery

## 2018-09-02 NOTE — Anesthesia Postprocedure Evaluation (Signed)
Anesthesia Post Note  Patient: Lance Hill  Procedure(s) Performed: Right acetabular revision (Right Hip)     Patient location during evaluation: PACU Anesthesia Type: MAC and Spinal Level of consciousness: awake and alert Pain management: pain level controlled Vital Signs Assessment: post-procedure vital signs reviewed and stable Respiratory status: spontaneous breathing, nonlabored ventilation, respiratory function stable and patient connected to nasal cannula oxygen Cardiovascular status: stable and blood pressure returned to baseline Postop Assessment: no apparent nausea or vomiting and patient able to bend at knees Anesthetic complications: no    Last Vitals:  Vitals:   08/26/18 0916 08/26/18 1323  BP: 118/72 124/78  Pulse: 80 80  Resp: 16 16  Temp: 36.7 C 36.8 C  SpO2: 100% 99%    Last Pain:  Vitals:   08/26/18 1323  TempSrc: Oral  PainSc:                  Adjoa Althouse

## 2018-09-29 DIAGNOSIS — E1169 Type 2 diabetes mellitus with other specified complication: Secondary | ICD-10-CM | POA: Diagnosis not present

## 2018-09-29 DIAGNOSIS — Z125 Encounter for screening for malignant neoplasm of prostate: Secondary | ICD-10-CM | POA: Diagnosis not present

## 2018-09-29 DIAGNOSIS — Z6830 Body mass index (BMI) 30.0-30.9, adult: Secondary | ICD-10-CM | POA: Diagnosis not present

## 2018-09-29 DIAGNOSIS — Z79899 Other long term (current) drug therapy: Secondary | ICD-10-CM | POA: Diagnosis not present

## 2018-09-29 DIAGNOSIS — E785 Hyperlipidemia, unspecified: Secondary | ICD-10-CM | POA: Diagnosis not present

## 2018-09-29 DIAGNOSIS — I10 Essential (primary) hypertension: Secondary | ICD-10-CM | POA: Diagnosis not present

## 2018-09-29 DIAGNOSIS — Z0000 Encounter for general adult medical examination without abnormal findings: Secondary | ICD-10-CM | POA: Diagnosis not present

## 2018-09-30 DIAGNOSIS — Z471 Aftercare following joint replacement surgery: Secondary | ICD-10-CM | POA: Diagnosis not present

## 2018-09-30 DIAGNOSIS — T84090D Other mechanical complication of internal right hip prosthesis, subsequent encounter: Secondary | ICD-10-CM | POA: Diagnosis not present

## 2018-09-30 DIAGNOSIS — M25561 Pain in right knee: Secondary | ICD-10-CM | POA: Diagnosis not present

## 2018-09-30 DIAGNOSIS — Z96641 Presence of right artificial hip joint: Secondary | ICD-10-CM | POA: Diagnosis not present

## 2018-11-06 DIAGNOSIS — Z96641 Presence of right artificial hip joint: Secondary | ICD-10-CM | POA: Diagnosis not present

## 2018-11-06 DIAGNOSIS — M7061 Trochanteric bursitis, right hip: Secondary | ICD-10-CM | POA: Diagnosis not present

## 2018-11-28 ENCOUNTER — Other Ambulatory Visit: Payer: Self-pay

## 2018-12-29 DIAGNOSIS — I10 Essential (primary) hypertension: Secondary | ICD-10-CM | POA: Diagnosis not present

## 2018-12-29 DIAGNOSIS — E785 Hyperlipidemia, unspecified: Secondary | ICD-10-CM | POA: Diagnosis not present

## 2019-01-02 IMAGING — CR DG HIP (WITH OR WITHOUT PELVIS) 2-3V*R*
3 series · 3 of 3 positions shown · non-contrast
Comparison: None.

CLINICAL DATA: Right hip pain.

EXAM:
DG HIP (WITH OR WITHOUT PELVIS) 2-3V RIGHT

[w hip lat right]
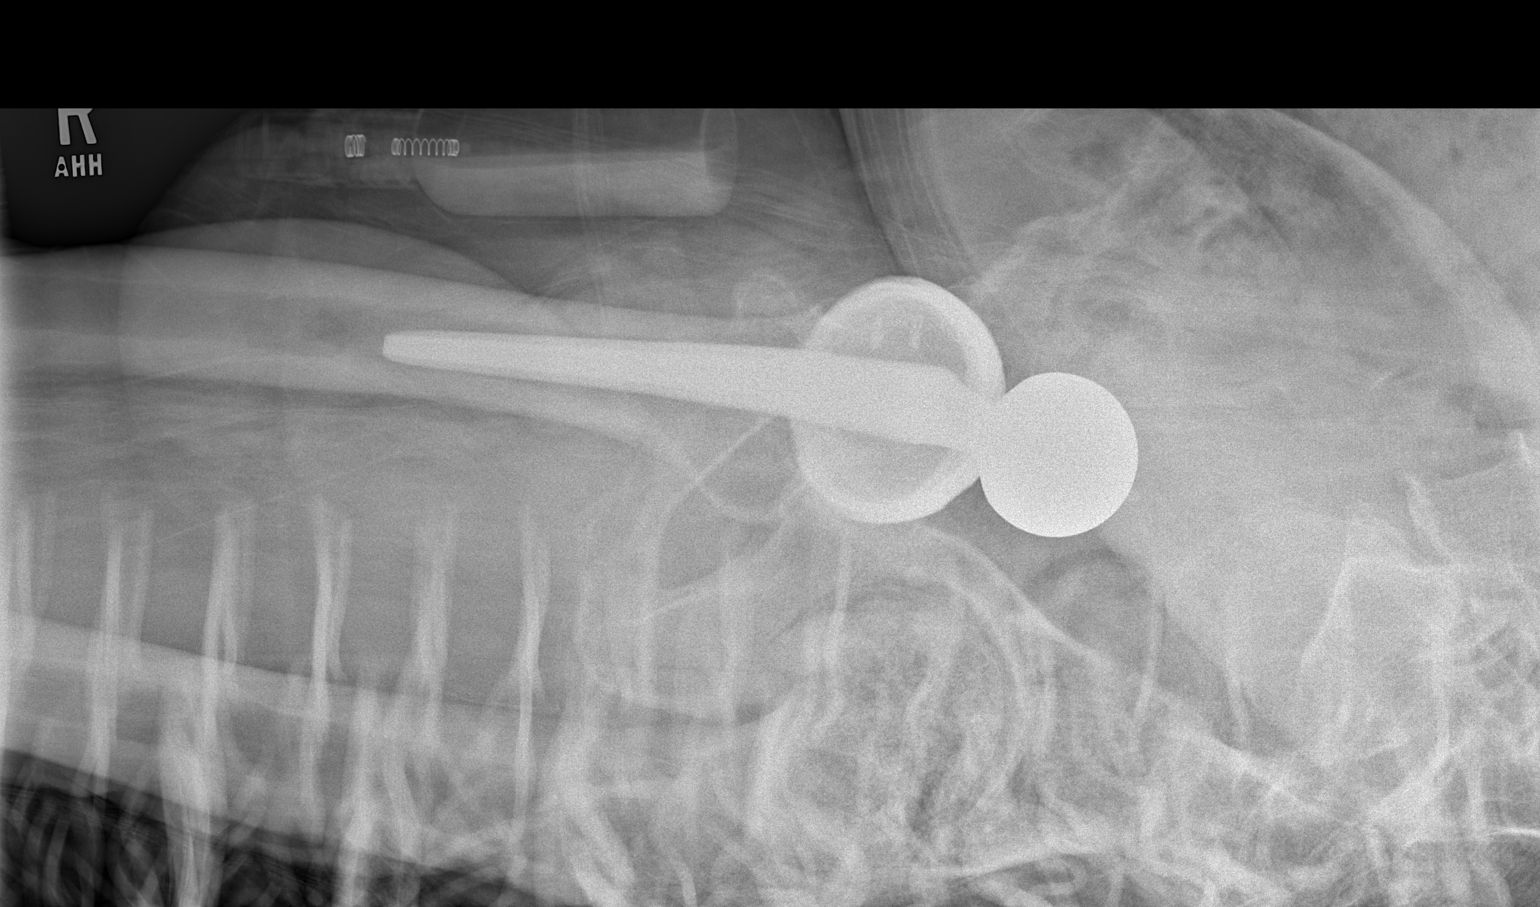

[x pelvis (1 of 2)]
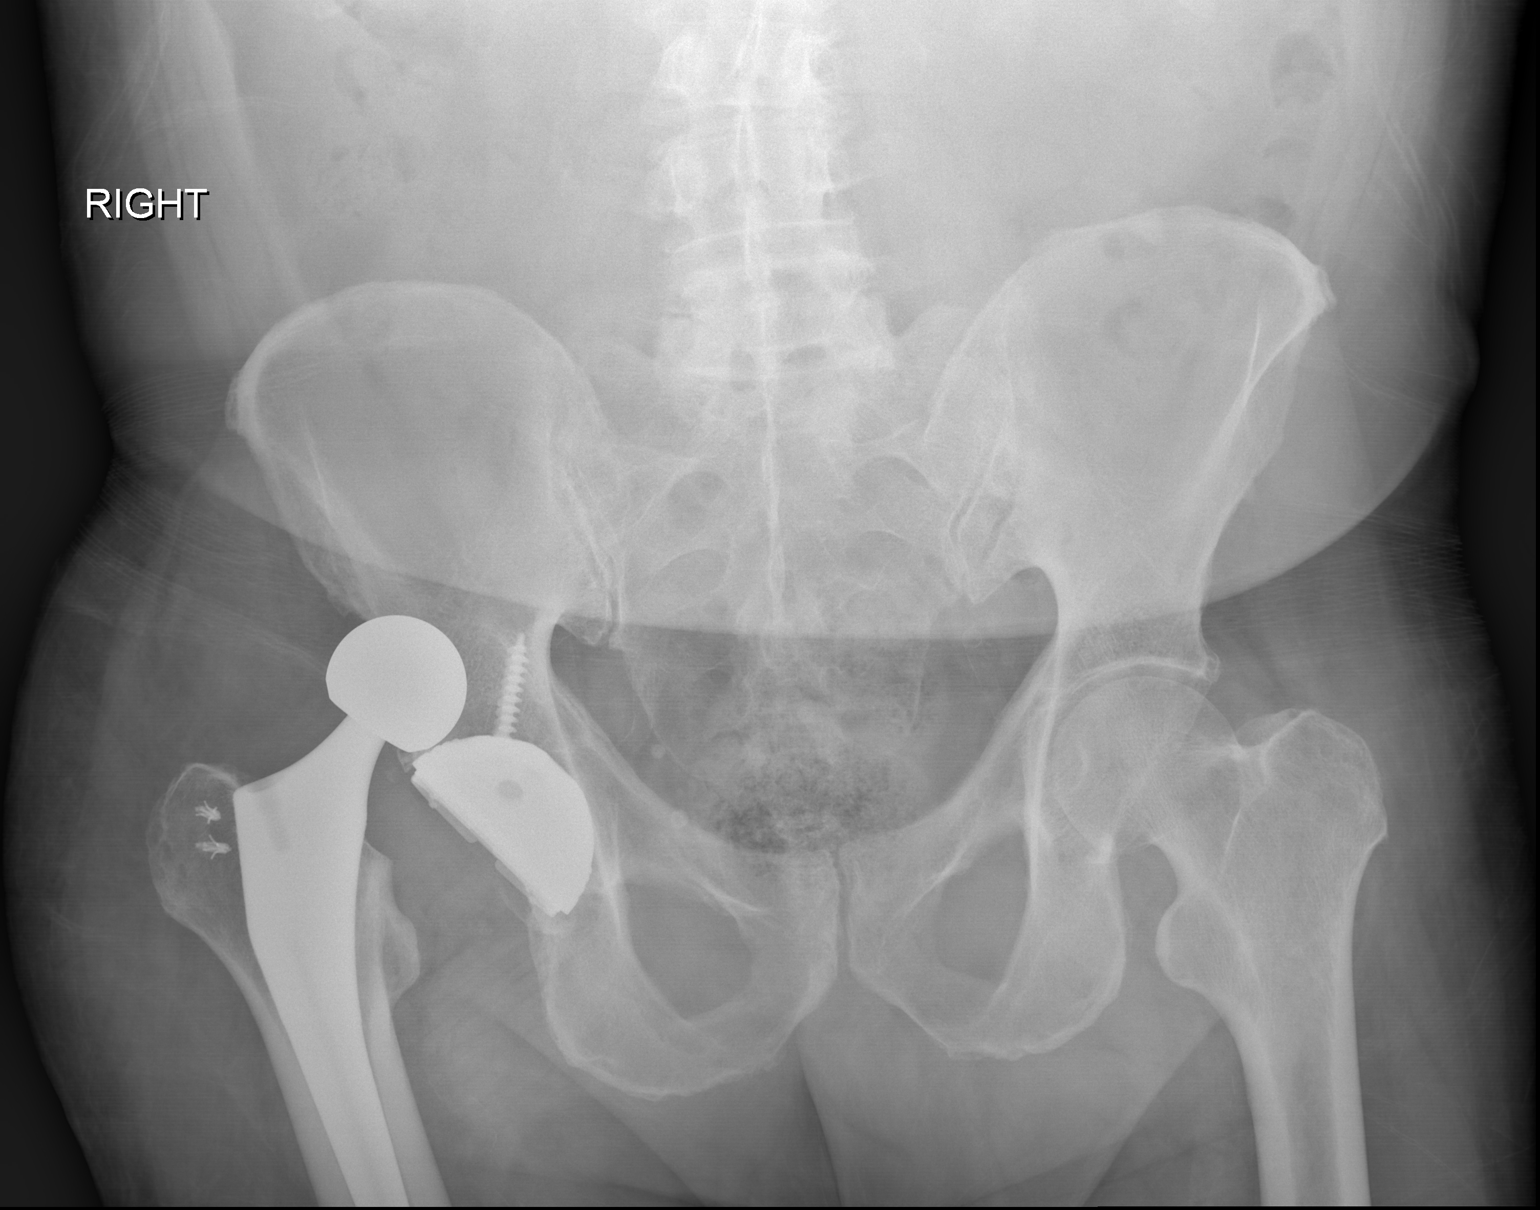

[x pelvis (2 of 2)]
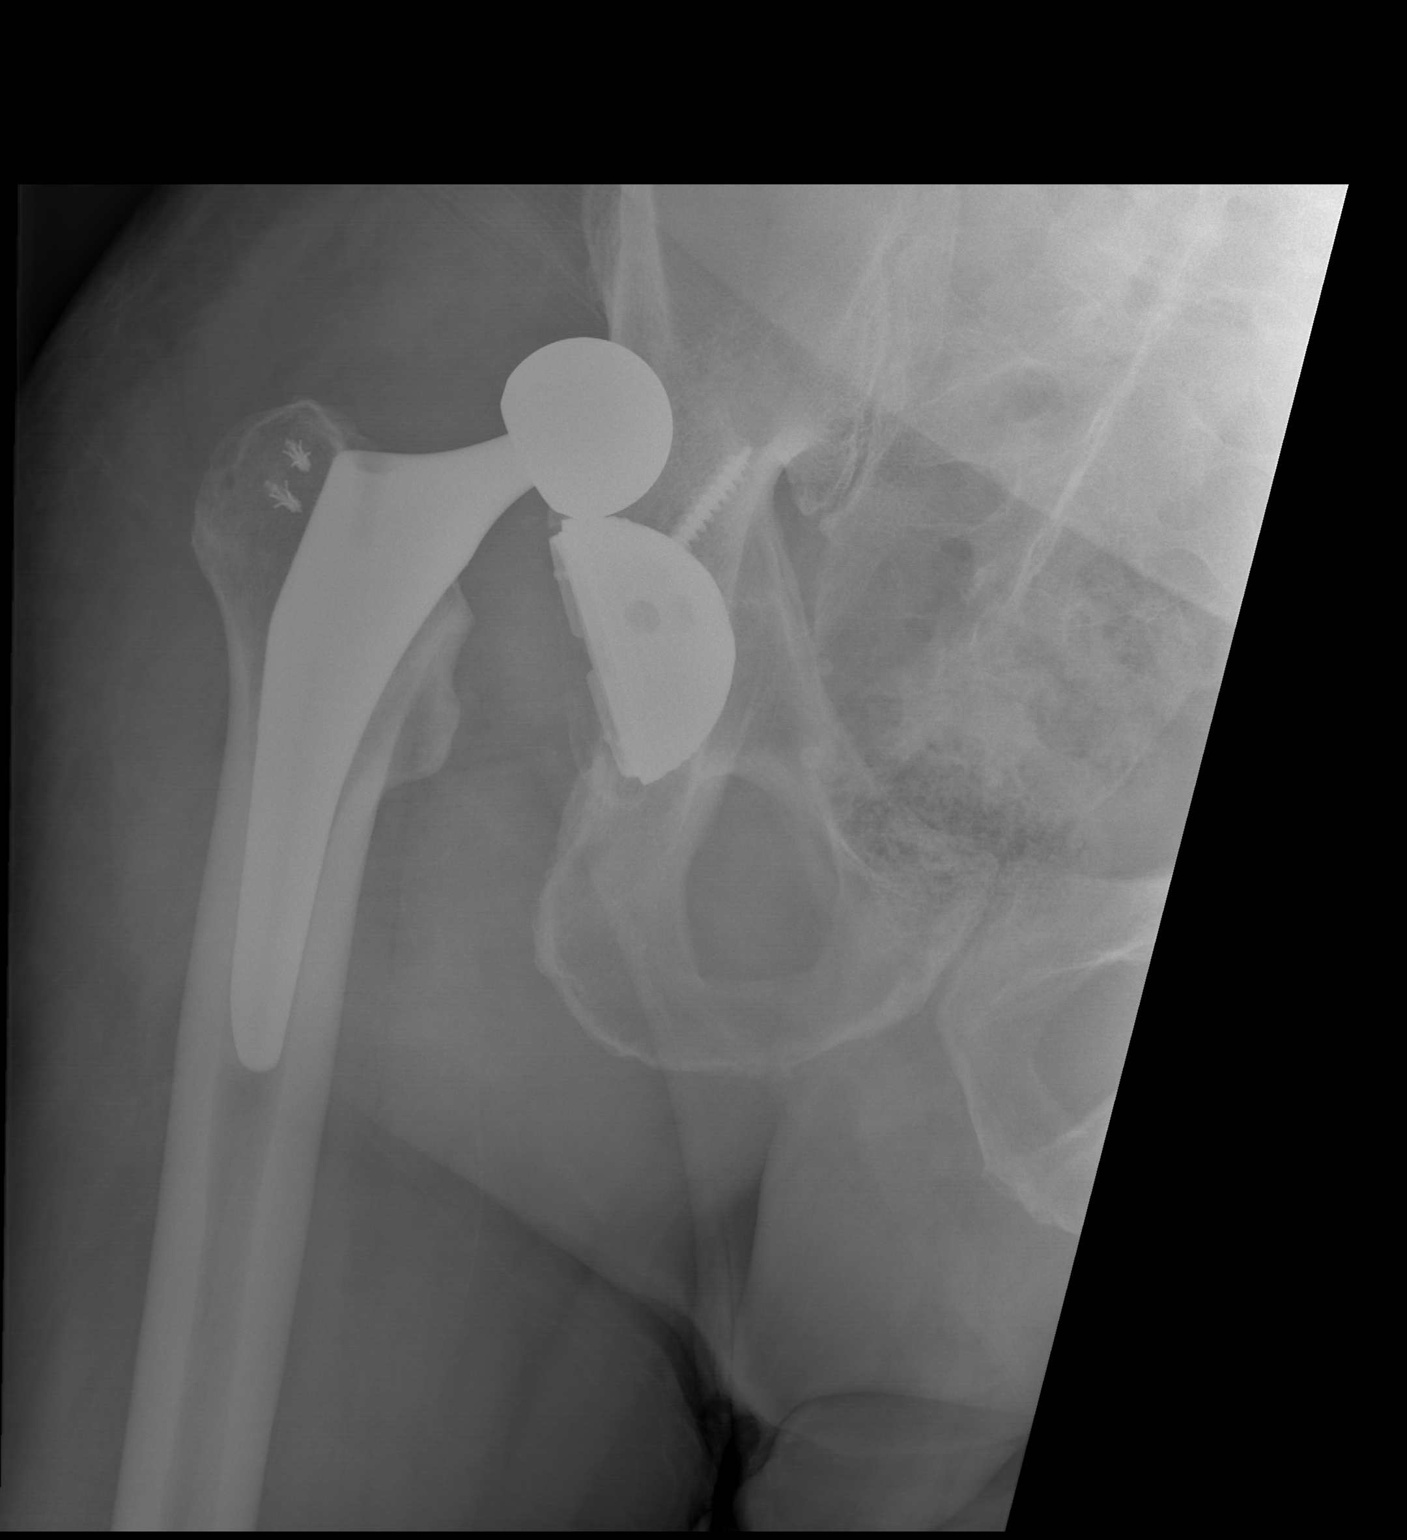

[3 of 3 positions shown; findings below may reference images not displayed]

FINDINGS: The patient is status post right hip replacement. There is
dislocation of the femoral component superior to the acetabular
component. No fractures are seen.
IMPRESSION: Dislocation of the right hip replacement.

## 2019-01-28 DIAGNOSIS — N400 Enlarged prostate without lower urinary tract symptoms: Secondary | ICD-10-CM | POA: Diagnosis not present

## 2019-01-28 DIAGNOSIS — E1169 Type 2 diabetes mellitus with other specified complication: Secondary | ICD-10-CM | POA: Diagnosis not present

## 2019-01-28 DIAGNOSIS — E785 Hyperlipidemia, unspecified: Secondary | ICD-10-CM | POA: Diagnosis not present

## 2019-01-28 DIAGNOSIS — I10 Essential (primary) hypertension: Secondary | ICD-10-CM | POA: Diagnosis not present

## 2019-01-29 DIAGNOSIS — M5417 Radiculopathy, lumbosacral region: Secondary | ICD-10-CM | POA: Diagnosis not present

## 2019-01-29 DIAGNOSIS — M5136 Other intervertebral disc degeneration, lumbar region: Secondary | ICD-10-CM | POA: Diagnosis not present

## 2019-01-29 DIAGNOSIS — M545 Low back pain: Secondary | ICD-10-CM | POA: Diagnosis not present

## 2019-02-10 DIAGNOSIS — M545 Low back pain: Secondary | ICD-10-CM | POA: Diagnosis not present

## 2019-02-17 DIAGNOSIS — M48061 Spinal stenosis, lumbar region without neurogenic claudication: Secondary | ICD-10-CM | POA: Diagnosis not present

## 2019-02-17 DIAGNOSIS — M545 Low back pain: Secondary | ICD-10-CM | POA: Diagnosis not present

## 2019-02-17 DIAGNOSIS — Z96641 Presence of right artificial hip joint: Secondary | ICD-10-CM | POA: Diagnosis not present

## 2019-02-17 DIAGNOSIS — M5136 Other intervertebral disc degeneration, lumbar region: Secondary | ICD-10-CM | POA: Diagnosis not present

## 2019-02-17 DIAGNOSIS — M7061 Trochanteric bursitis, right hip: Secondary | ICD-10-CM | POA: Insufficient documentation

## 2019-02-17 DIAGNOSIS — E119 Type 2 diabetes mellitus without complications: Secondary | ICD-10-CM

## 2019-02-17 HISTORY — DX: Type 2 diabetes mellitus without complications: E11.9

## 2019-02-17 HISTORY — DX: Trochanteric bursitis, right hip: M70.61

## 2019-02-18 DIAGNOSIS — M4046 Postural lordosis, lumbar region: Secondary | ICD-10-CM | POA: Diagnosis not present

## 2019-02-18 DIAGNOSIS — M62552 Muscle wasting and atrophy, not elsewhere classified, left thigh: Secondary | ICD-10-CM | POA: Diagnosis not present

## 2019-02-18 DIAGNOSIS — M545 Low back pain: Secondary | ICD-10-CM | POA: Diagnosis not present

## 2019-02-18 DIAGNOSIS — M62551 Muscle wasting and atrophy, not elsewhere classified, right thigh: Secondary | ICD-10-CM | POA: Diagnosis not present

## 2019-02-18 DIAGNOSIS — M4047 Postural lordosis, lumbosacral region: Secondary | ICD-10-CM | POA: Diagnosis not present

## 2019-02-20 DIAGNOSIS — M545 Low back pain: Secondary | ICD-10-CM | POA: Diagnosis not present

## 2019-02-20 DIAGNOSIS — M62552 Muscle wasting and atrophy, not elsewhere classified, left thigh: Secondary | ICD-10-CM | POA: Diagnosis not present

## 2019-02-20 DIAGNOSIS — M62551 Muscle wasting and atrophy, not elsewhere classified, right thigh: Secondary | ICD-10-CM | POA: Diagnosis not present

## 2019-02-20 DIAGNOSIS — M4047 Postural lordosis, lumbosacral region: Secondary | ICD-10-CM | POA: Diagnosis not present

## 2019-02-20 DIAGNOSIS — M4046 Postural lordosis, lumbar region: Secondary | ICD-10-CM | POA: Diagnosis not present

## 2019-02-24 DIAGNOSIS — M4047 Postural lordosis, lumbosacral region: Secondary | ICD-10-CM | POA: Diagnosis not present

## 2019-02-24 DIAGNOSIS — M545 Low back pain: Secondary | ICD-10-CM | POA: Diagnosis not present

## 2019-02-24 DIAGNOSIS — M4046 Postural lordosis, lumbar region: Secondary | ICD-10-CM | POA: Diagnosis not present

## 2019-02-24 DIAGNOSIS — M62552 Muscle wasting and atrophy, not elsewhere classified, left thigh: Secondary | ICD-10-CM | POA: Diagnosis not present

## 2019-02-24 DIAGNOSIS — M62551 Muscle wasting and atrophy, not elsewhere classified, right thigh: Secondary | ICD-10-CM | POA: Diagnosis not present

## 2019-02-26 DIAGNOSIS — M4047 Postural lordosis, lumbosacral region: Secondary | ICD-10-CM | POA: Diagnosis not present

## 2019-02-26 DIAGNOSIS — M62551 Muscle wasting and atrophy, not elsewhere classified, right thigh: Secondary | ICD-10-CM | POA: Diagnosis not present

## 2019-02-26 DIAGNOSIS — M4046 Postural lordosis, lumbar region: Secondary | ICD-10-CM | POA: Diagnosis not present

## 2019-02-26 DIAGNOSIS — M62552 Muscle wasting and atrophy, not elsewhere classified, left thigh: Secondary | ICD-10-CM | POA: Diagnosis not present

## 2019-02-26 DIAGNOSIS — M545 Low back pain: Secondary | ICD-10-CM | POA: Diagnosis not present

## 2019-02-28 DIAGNOSIS — M5136 Other intervertebral disc degeneration, lumbar region: Secondary | ICD-10-CM | POA: Diagnosis not present

## 2019-03-18 DIAGNOSIS — M4046 Postural lordosis, lumbar region: Secondary | ICD-10-CM | POA: Diagnosis not present

## 2019-03-18 DIAGNOSIS — M545 Low back pain: Secondary | ICD-10-CM | POA: Diagnosis not present

## 2019-03-18 DIAGNOSIS — M62551 Muscle wasting and atrophy, not elsewhere classified, right thigh: Secondary | ICD-10-CM | POA: Diagnosis not present

## 2019-03-18 DIAGNOSIS — M4047 Postural lordosis, lumbosacral region: Secondary | ICD-10-CM | POA: Diagnosis not present

## 2019-03-18 DIAGNOSIS — M62552 Muscle wasting and atrophy, not elsewhere classified, left thigh: Secondary | ICD-10-CM | POA: Diagnosis not present

## 2019-03-20 DIAGNOSIS — M62552 Muscle wasting and atrophy, not elsewhere classified, left thigh: Secondary | ICD-10-CM | POA: Diagnosis not present

## 2019-03-20 DIAGNOSIS — M4047 Postural lordosis, lumbosacral region: Secondary | ICD-10-CM | POA: Diagnosis not present

## 2019-03-20 DIAGNOSIS — M4046 Postural lordosis, lumbar region: Secondary | ICD-10-CM | POA: Diagnosis not present

## 2019-03-20 DIAGNOSIS — M62551 Muscle wasting and atrophy, not elsewhere classified, right thigh: Secondary | ICD-10-CM | POA: Diagnosis not present

## 2019-03-20 DIAGNOSIS — M545 Low back pain: Secondary | ICD-10-CM | POA: Diagnosis not present

## 2019-03-23 DIAGNOSIS — M4047 Postural lordosis, lumbosacral region: Secondary | ICD-10-CM | POA: Diagnosis not present

## 2019-03-23 DIAGNOSIS — M62552 Muscle wasting and atrophy, not elsewhere classified, left thigh: Secondary | ICD-10-CM | POA: Diagnosis not present

## 2019-03-23 DIAGNOSIS — M545 Low back pain: Secondary | ICD-10-CM | POA: Diagnosis not present

## 2019-03-23 DIAGNOSIS — M4046 Postural lordosis, lumbar region: Secondary | ICD-10-CM | POA: Diagnosis not present

## 2019-03-23 DIAGNOSIS — M62551 Muscle wasting and atrophy, not elsewhere classified, right thigh: Secondary | ICD-10-CM | POA: Diagnosis not present

## 2019-03-25 DIAGNOSIS — M545 Low back pain: Secondary | ICD-10-CM | POA: Diagnosis not present

## 2019-03-25 DIAGNOSIS — M4046 Postural lordosis, lumbar region: Secondary | ICD-10-CM | POA: Diagnosis not present

## 2019-03-25 DIAGNOSIS — M62552 Muscle wasting and atrophy, not elsewhere classified, left thigh: Secondary | ICD-10-CM | POA: Diagnosis not present

## 2019-03-25 DIAGNOSIS — M4047 Postural lordosis, lumbosacral region: Secondary | ICD-10-CM | POA: Diagnosis not present

## 2019-03-25 DIAGNOSIS — M62551 Muscle wasting and atrophy, not elsewhere classified, right thigh: Secondary | ICD-10-CM | POA: Diagnosis not present

## 2019-03-30 DIAGNOSIS — M4047 Postural lordosis, lumbosacral region: Secondary | ICD-10-CM | POA: Diagnosis not present

## 2019-03-30 DIAGNOSIS — M545 Low back pain: Secondary | ICD-10-CM | POA: Diagnosis not present

## 2019-03-30 DIAGNOSIS — M4046 Postural lordosis, lumbar region: Secondary | ICD-10-CM | POA: Diagnosis not present

## 2019-03-30 DIAGNOSIS — M62551 Muscle wasting and atrophy, not elsewhere classified, right thigh: Secondary | ICD-10-CM | POA: Diagnosis not present

## 2019-03-30 DIAGNOSIS — M62552 Muscle wasting and atrophy, not elsewhere classified, left thigh: Secondary | ICD-10-CM | POA: Diagnosis not present

## 2019-04-02 DIAGNOSIS — M4047 Postural lordosis, lumbosacral region: Secondary | ICD-10-CM | POA: Diagnosis not present

## 2019-04-02 DIAGNOSIS — M545 Low back pain: Secondary | ICD-10-CM | POA: Diagnosis not present

## 2019-04-02 DIAGNOSIS — M62552 Muscle wasting and atrophy, not elsewhere classified, left thigh: Secondary | ICD-10-CM | POA: Diagnosis not present

## 2019-04-02 DIAGNOSIS — M62551 Muscle wasting and atrophy, not elsewhere classified, right thigh: Secondary | ICD-10-CM | POA: Diagnosis not present

## 2019-04-02 DIAGNOSIS — M4046 Postural lordosis, lumbar region: Secondary | ICD-10-CM | POA: Diagnosis not present

## 2019-04-06 DIAGNOSIS — M545 Low back pain: Secondary | ICD-10-CM | POA: Diagnosis not present

## 2019-04-06 DIAGNOSIS — M62551 Muscle wasting and atrophy, not elsewhere classified, right thigh: Secondary | ICD-10-CM | POA: Diagnosis not present

## 2019-04-06 DIAGNOSIS — M4046 Postural lordosis, lumbar region: Secondary | ICD-10-CM | POA: Diagnosis not present

## 2019-04-06 DIAGNOSIS — M4047 Postural lordosis, lumbosacral region: Secondary | ICD-10-CM | POA: Diagnosis not present

## 2019-04-06 DIAGNOSIS — M62552 Muscle wasting and atrophy, not elsewhere classified, left thigh: Secondary | ICD-10-CM | POA: Diagnosis not present

## 2019-04-08 DIAGNOSIS — M4046 Postural lordosis, lumbar region: Secondary | ICD-10-CM | POA: Diagnosis not present

## 2019-04-08 DIAGNOSIS — M62551 Muscle wasting and atrophy, not elsewhere classified, right thigh: Secondary | ICD-10-CM | POA: Diagnosis not present

## 2019-04-08 DIAGNOSIS — M62552 Muscle wasting and atrophy, not elsewhere classified, left thigh: Secondary | ICD-10-CM | POA: Diagnosis not present

## 2019-04-08 DIAGNOSIS — M545 Low back pain: Secondary | ICD-10-CM | POA: Diagnosis not present

## 2019-04-08 DIAGNOSIS — M4047 Postural lordosis, lumbosacral region: Secondary | ICD-10-CM | POA: Diagnosis not present

## 2019-04-09 DIAGNOSIS — M7061 Trochanteric bursitis, right hip: Secondary | ICD-10-CM | POA: Diagnosis not present

## 2019-04-09 DIAGNOSIS — M25551 Pain in right hip: Secondary | ICD-10-CM | POA: Diagnosis not present

## 2019-05-06 DIAGNOSIS — M4046 Postural lordosis, lumbar region: Secondary | ICD-10-CM | POA: Diagnosis not present

## 2019-05-06 DIAGNOSIS — M545 Low back pain: Secondary | ICD-10-CM | POA: Diagnosis not present

## 2019-05-06 DIAGNOSIS — M4047 Postural lordosis, lumbosacral region: Secondary | ICD-10-CM | POA: Diagnosis not present

## 2019-05-06 DIAGNOSIS — M62551 Muscle wasting and atrophy, not elsewhere classified, right thigh: Secondary | ICD-10-CM | POA: Diagnosis not present

## 2019-05-06 DIAGNOSIS — M62552 Muscle wasting and atrophy, not elsewhere classified, left thigh: Secondary | ICD-10-CM | POA: Diagnosis not present

## 2019-05-19 DIAGNOSIS — M545 Low back pain: Secondary | ICD-10-CM | POA: Diagnosis not present

## 2019-05-19 DIAGNOSIS — M4047 Postural lordosis, lumbosacral region: Secondary | ICD-10-CM | POA: Diagnosis not present

## 2019-05-19 DIAGNOSIS — M4046 Postural lordosis, lumbar region: Secondary | ICD-10-CM | POA: Diagnosis not present

## 2019-05-19 DIAGNOSIS — M62552 Muscle wasting and atrophy, not elsewhere classified, left thigh: Secondary | ICD-10-CM | POA: Diagnosis not present

## 2019-05-19 DIAGNOSIS — M62551 Muscle wasting and atrophy, not elsewhere classified, right thigh: Secondary | ICD-10-CM | POA: Diagnosis not present

## 2019-05-21 DIAGNOSIS — M62552 Muscle wasting and atrophy, not elsewhere classified, left thigh: Secondary | ICD-10-CM | POA: Diagnosis not present

## 2019-05-21 DIAGNOSIS — M62551 Muscle wasting and atrophy, not elsewhere classified, right thigh: Secondary | ICD-10-CM | POA: Diagnosis not present

## 2019-05-21 DIAGNOSIS — M4047 Postural lordosis, lumbosacral region: Secondary | ICD-10-CM | POA: Diagnosis not present

## 2019-05-21 DIAGNOSIS — M545 Low back pain: Secondary | ICD-10-CM | POA: Diagnosis not present

## 2019-05-21 DIAGNOSIS — M4046 Postural lordosis, lumbar region: Secondary | ICD-10-CM | POA: Diagnosis not present

## 2019-05-26 DIAGNOSIS — M4047 Postural lordosis, lumbosacral region: Secondary | ICD-10-CM | POA: Diagnosis not present

## 2019-05-26 DIAGNOSIS — M62551 Muscle wasting and atrophy, not elsewhere classified, right thigh: Secondary | ICD-10-CM | POA: Diagnosis not present

## 2019-05-26 DIAGNOSIS — M4046 Postural lordosis, lumbar region: Secondary | ICD-10-CM | POA: Diagnosis not present

## 2019-05-26 DIAGNOSIS — M545 Low back pain: Secondary | ICD-10-CM | POA: Diagnosis not present

## 2019-05-26 DIAGNOSIS — M62552 Muscle wasting and atrophy, not elsewhere classified, left thigh: Secondary | ICD-10-CM | POA: Diagnosis not present

## 2019-05-28 DIAGNOSIS — M4046 Postural lordosis, lumbar region: Secondary | ICD-10-CM | POA: Diagnosis not present

## 2019-05-28 DIAGNOSIS — M545 Low back pain: Secondary | ICD-10-CM | POA: Diagnosis not present

## 2019-05-28 DIAGNOSIS — M62551 Muscle wasting and atrophy, not elsewhere classified, right thigh: Secondary | ICD-10-CM | POA: Diagnosis not present

## 2019-05-28 DIAGNOSIS — M62552 Muscle wasting and atrophy, not elsewhere classified, left thigh: Secondary | ICD-10-CM | POA: Diagnosis not present

## 2019-05-28 DIAGNOSIS — M4047 Postural lordosis, lumbosacral region: Secondary | ICD-10-CM | POA: Diagnosis not present

## 2019-06-26 DIAGNOSIS — N3000 Acute cystitis without hematuria: Secondary | ICD-10-CM | POA: Diagnosis not present

## 2019-06-26 DIAGNOSIS — E785 Hyperlipidemia, unspecified: Secondary | ICD-10-CM | POA: Diagnosis not present

## 2019-06-26 DIAGNOSIS — Z6831 Body mass index (BMI) 31.0-31.9, adult: Secondary | ICD-10-CM | POA: Diagnosis not present

## 2019-06-26 DIAGNOSIS — E1169 Type 2 diabetes mellitus with other specified complication: Secondary | ICD-10-CM | POA: Diagnosis not present

## 2019-07-16 DIAGNOSIS — M545 Low back pain: Secondary | ICD-10-CM | POA: Diagnosis not present

## 2019-07-16 DIAGNOSIS — J302 Other seasonal allergic rhinitis: Secondary | ICD-10-CM | POA: Diagnosis not present

## 2019-07-16 DIAGNOSIS — R062 Wheezing: Secondary | ICD-10-CM | POA: Diagnosis not present

## 2019-07-16 DIAGNOSIS — N3000 Acute cystitis without hematuria: Secondary | ICD-10-CM | POA: Diagnosis not present

## 2019-08-20 DIAGNOSIS — N401 Enlarged prostate with lower urinary tract symptoms: Secondary | ICD-10-CM | POA: Diagnosis not present

## 2019-08-20 DIAGNOSIS — Z125 Encounter for screening for malignant neoplasm of prostate: Secondary | ICD-10-CM | POA: Diagnosis not present

## 2019-08-20 DIAGNOSIS — E1169 Type 2 diabetes mellitus with other specified complication: Secondary | ICD-10-CM | POA: Diagnosis not present

## 2019-08-20 DIAGNOSIS — Z79899 Other long term (current) drug therapy: Secondary | ICD-10-CM | POA: Diagnosis not present

## 2019-08-20 DIAGNOSIS — E785 Hyperlipidemia, unspecified: Secondary | ICD-10-CM | POA: Diagnosis not present

## 2019-08-20 DIAGNOSIS — N3001 Acute cystitis with hematuria: Secondary | ICD-10-CM | POA: Diagnosis not present

## 2019-08-20 DIAGNOSIS — N138 Other obstructive and reflux uropathy: Secondary | ICD-10-CM | POA: Diagnosis not present

## 2019-09-30 DIAGNOSIS — N401 Enlarged prostate with lower urinary tract symptoms: Secondary | ICD-10-CM | POA: Diagnosis not present

## 2019-09-30 DIAGNOSIS — I10 Essential (primary) hypertension: Secondary | ICD-10-CM | POA: Diagnosis not present

## 2019-09-30 DIAGNOSIS — E785 Hyperlipidemia, unspecified: Secondary | ICD-10-CM | POA: Diagnosis not present

## 2019-09-30 DIAGNOSIS — E1169 Type 2 diabetes mellitus with other specified complication: Secondary | ICD-10-CM | POA: Diagnosis not present

## 2019-09-30 DIAGNOSIS — Z0000 Encounter for general adult medical examination without abnormal findings: Secondary | ICD-10-CM | POA: Diagnosis not present

## 2019-11-25 DIAGNOSIS — K591 Functional diarrhea: Secondary | ICD-10-CM | POA: Diagnosis not present

## 2019-11-25 DIAGNOSIS — K219 Gastro-esophageal reflux disease without esophagitis: Secondary | ICD-10-CM | POA: Diagnosis not present

## 2019-12-04 DIAGNOSIS — R350 Frequency of micturition: Secondary | ICD-10-CM | POA: Diagnosis not present

## 2019-12-04 DIAGNOSIS — N3941 Urge incontinence: Secondary | ICD-10-CM | POA: Diagnosis not present

## 2019-12-10 DIAGNOSIS — M5136 Other intervertebral disc degeneration, lumbar region: Secondary | ICD-10-CM | POA: Diagnosis not present

## 2019-12-10 DIAGNOSIS — M545 Low back pain: Secondary | ICD-10-CM | POA: Diagnosis not present

## 2019-12-15 DIAGNOSIS — L259 Unspecified contact dermatitis, unspecified cause: Secondary | ICD-10-CM | POA: Diagnosis not present

## 2019-12-15 DIAGNOSIS — Z6831 Body mass index (BMI) 31.0-31.9, adult: Secondary | ICD-10-CM | POA: Diagnosis not present

## 2019-12-15 DIAGNOSIS — I10 Essential (primary) hypertension: Secondary | ICD-10-CM | POA: Diagnosis not present

## 2019-12-31 DIAGNOSIS — J329 Chronic sinusitis, unspecified: Secondary | ICD-10-CM | POA: Diagnosis not present

## 2019-12-31 DIAGNOSIS — E785 Hyperlipidemia, unspecified: Secondary | ICD-10-CM | POA: Diagnosis not present

## 2019-12-31 DIAGNOSIS — I10 Essential (primary) hypertension: Secondary | ICD-10-CM | POA: Diagnosis not present

## 2019-12-31 DIAGNOSIS — E1169 Type 2 diabetes mellitus with other specified complication: Secondary | ICD-10-CM | POA: Diagnosis not present

## 2020-01-21 DIAGNOSIS — R17 Unspecified jaundice: Secondary | ICD-10-CM | POA: Diagnosis not present

## 2020-01-23 IMAGING — DX PORTABLE PELVIS 1-2 VIEWS
1 series · 1 of 1 positions shown · non-contrast
Comparison: 08/14/2018

CLINICAL DATA: Right acetabular revision.

EXAM:
PORTABLE PELVIS 1-2 VIEWS

[pelvis ap]
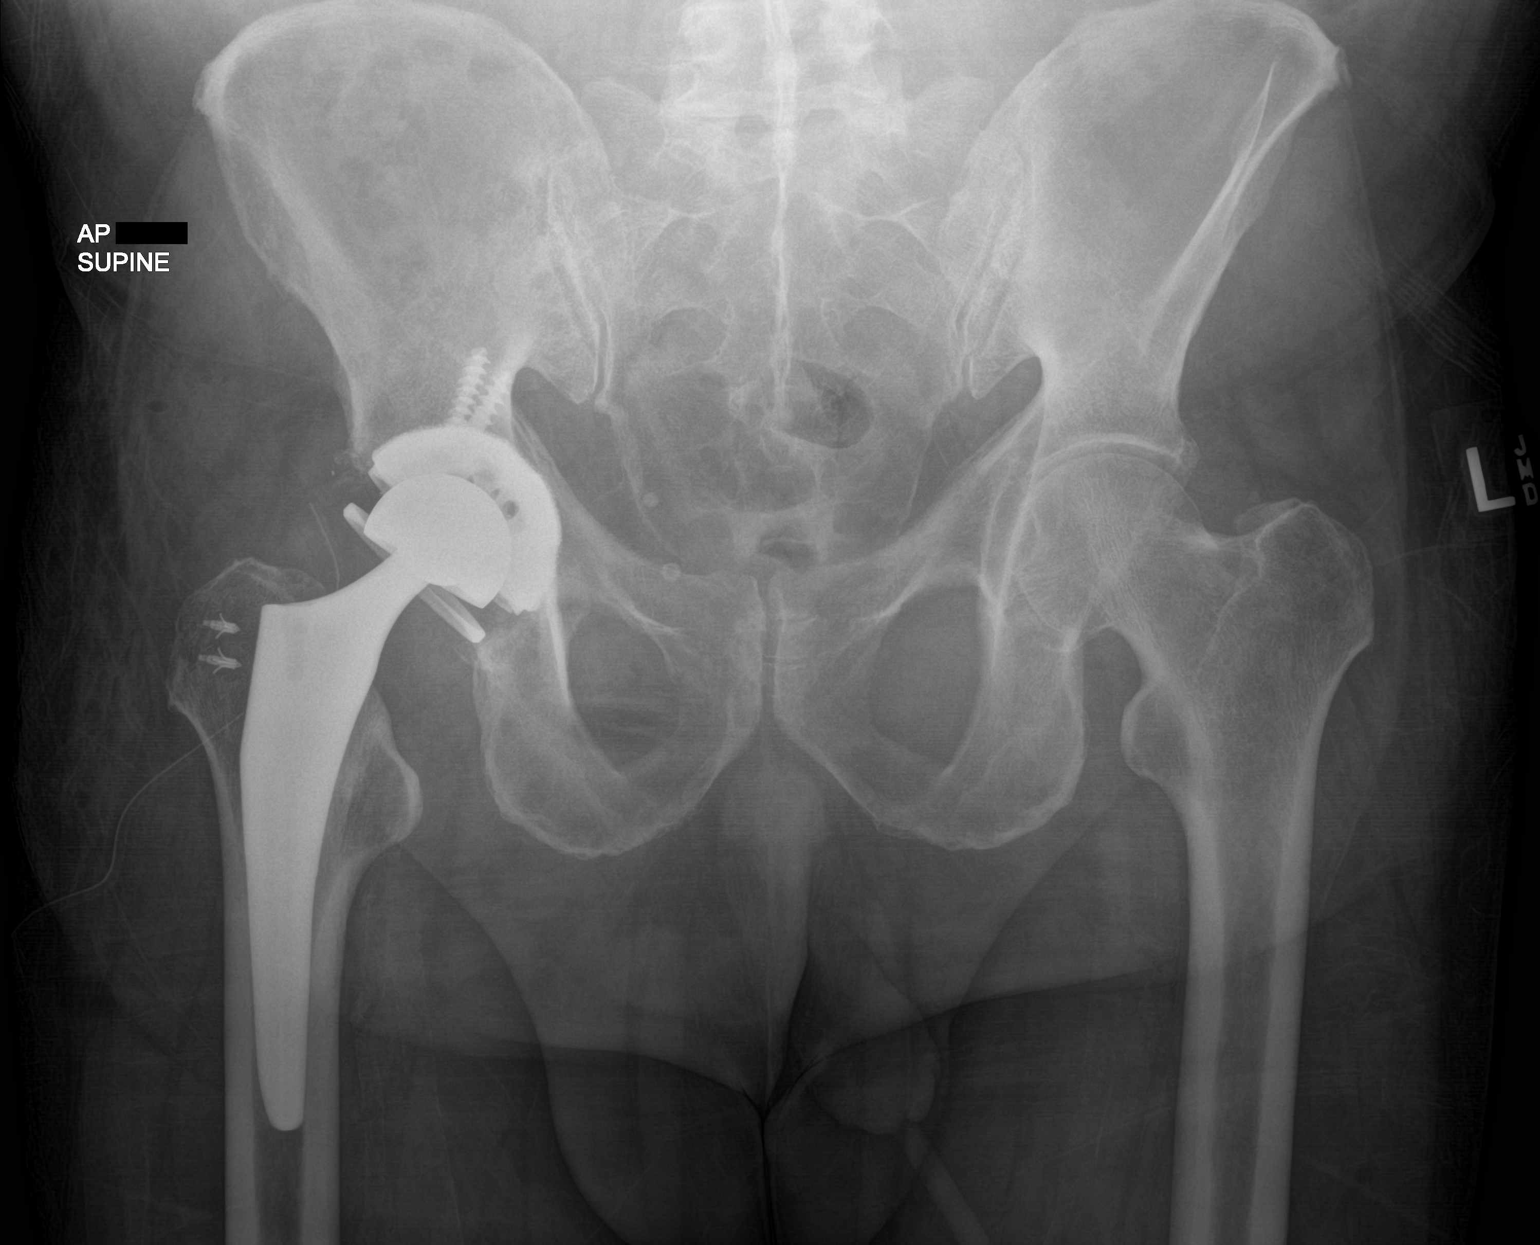

[1 of 1 positions shown; findings below may reference images not displayed]

FINDINGS: Revision of the right total hip arthroplasty. The right hip appears
located on this single view. Again noted are anchor screws in the
right trochanteric region. There is a surgical drain present. The
entire right femoral prosthesis is evaluated. No evidence for a
periprosthetic fracture. Pelvic bony ring is intact.
IMPRESSION: Right hip arthroplasty revision.  Expected postsurgical changes.

## 2020-02-05 DIAGNOSIS — J40 Bronchitis, not specified as acute or chronic: Secondary | ICD-10-CM | POA: Diagnosis not present

## 2020-02-05 DIAGNOSIS — J302 Other seasonal allergic rhinitis: Secondary | ICD-10-CM | POA: Diagnosis not present

## 2020-02-05 DIAGNOSIS — J329 Chronic sinusitis, unspecified: Secondary | ICD-10-CM | POA: Diagnosis not present

## 2020-02-05 DIAGNOSIS — Z6830 Body mass index (BMI) 30.0-30.9, adult: Secondary | ICD-10-CM | POA: Diagnosis not present

## 2020-02-12 DIAGNOSIS — M7061 Trochanteric bursitis, right hip: Secondary | ICD-10-CM | POA: Diagnosis not present

## 2020-03-16 DIAGNOSIS — N3941 Urge incontinence: Secondary | ICD-10-CM | POA: Diagnosis not present

## 2020-03-16 DIAGNOSIS — R350 Frequency of micturition: Secondary | ICD-10-CM | POA: Diagnosis not present

## 2020-03-18 DIAGNOSIS — M545 Low back pain, unspecified: Secondary | ICD-10-CM | POA: Diagnosis not present

## 2020-03-18 DIAGNOSIS — M25551 Pain in right hip: Secondary | ICD-10-CM | POA: Diagnosis not present

## 2020-04-06 DIAGNOSIS — M25551 Pain in right hip: Secondary | ICD-10-CM | POA: Diagnosis not present

## 2020-05-02 DIAGNOSIS — M25551 Pain in right hip: Secondary | ICD-10-CM | POA: Diagnosis not present

## 2020-05-12 DIAGNOSIS — M7061 Trochanteric bursitis, right hip: Secondary | ICD-10-CM | POA: Diagnosis not present

## 2020-08-23 DIAGNOSIS — H409 Unspecified glaucoma: Secondary | ICD-10-CM | POA: Insufficient documentation

## 2020-08-23 DIAGNOSIS — M539 Dorsopathy, unspecified: Secondary | ICD-10-CM | POA: Insufficient documentation

## 2020-08-23 DIAGNOSIS — I10 Essential (primary) hypertension: Secondary | ICD-10-CM | POA: Insufficient documentation

## 2020-08-23 DIAGNOSIS — E785 Hyperlipidemia, unspecified: Secondary | ICD-10-CM | POA: Insufficient documentation

## 2020-08-23 DIAGNOSIS — E1140 Type 2 diabetes mellitus with diabetic neuropathy, unspecified: Secondary | ICD-10-CM

## 2020-08-23 DIAGNOSIS — N400 Enlarged prostate without lower urinary tract symptoms: Secondary | ICD-10-CM | POA: Insufficient documentation

## 2020-08-23 DIAGNOSIS — H903 Sensorineural hearing loss, bilateral: Secondary | ICD-10-CM | POA: Insufficient documentation

## 2020-08-23 HISTORY — DX: Type 2 diabetes mellitus with diabetic neuropathy, unspecified: E11.40

## 2020-08-23 HISTORY — DX: Benign prostatic hyperplasia without lower urinary tract symptoms: N40.0

## 2020-08-23 HISTORY — DX: Unspecified glaucoma: H40.9

## 2020-08-23 HISTORY — DX: Hyperlipidemia, unspecified: E78.5

## 2020-08-23 HISTORY — DX: Dorsopathy, unspecified: M53.9

## 2020-12-30 DIAGNOSIS — Z96641 Presence of right artificial hip joint: Secondary | ICD-10-CM | POA: Diagnosis not present

## 2020-12-30 DIAGNOSIS — M7061 Trochanteric bursitis, right hip: Secondary | ICD-10-CM | POA: Diagnosis not present

## 2021-01-12 DIAGNOSIS — Z20822 Contact with and (suspected) exposure to covid-19: Secondary | ICD-10-CM | POA: Diagnosis not present

## 2021-01-20 DIAGNOSIS — J329 Chronic sinusitis, unspecified: Secondary | ICD-10-CM | POA: Diagnosis not present

## 2021-01-20 DIAGNOSIS — J40 Bronchitis, not specified as acute or chronic: Secondary | ICD-10-CM | POA: Diagnosis not present

## 2021-02-10 DIAGNOSIS — M5451 Vertebrogenic low back pain: Secondary | ICD-10-CM | POA: Diagnosis not present

## 2021-02-10 DIAGNOSIS — Z96641 Presence of right artificial hip joint: Secondary | ICD-10-CM | POA: Diagnosis not present

## 2021-03-08 DIAGNOSIS — M25552 Pain in left hip: Secondary | ICD-10-CM | POA: Diagnosis not present

## 2021-03-08 DIAGNOSIS — M62551 Muscle wasting and atrophy, not elsewhere classified, right thigh: Secondary | ICD-10-CM | POA: Diagnosis not present

## 2021-03-08 DIAGNOSIS — M7072 Other bursitis of hip, left hip: Secondary | ICD-10-CM | POA: Diagnosis not present

## 2021-03-08 DIAGNOSIS — M25551 Pain in right hip: Secondary | ICD-10-CM | POA: Diagnosis not present

## 2021-03-08 DIAGNOSIS — R42 Dizziness and giddiness: Secondary | ICD-10-CM | POA: Diagnosis not present

## 2021-03-08 DIAGNOSIS — M79604 Pain in right leg: Secondary | ICD-10-CM | POA: Diagnosis not present

## 2021-03-08 DIAGNOSIS — M7071 Other bursitis of hip, right hip: Secondary | ICD-10-CM | POA: Diagnosis not present

## 2021-03-08 DIAGNOSIS — M545 Low back pain, unspecified: Secondary | ICD-10-CM | POA: Diagnosis not present

## 2021-03-10 DIAGNOSIS — R42 Dizziness and giddiness: Secondary | ICD-10-CM | POA: Diagnosis not present

## 2021-03-10 DIAGNOSIS — M79604 Pain in right leg: Secondary | ICD-10-CM | POA: Diagnosis not present

## 2021-03-10 DIAGNOSIS — M62551 Muscle wasting and atrophy, not elsewhere classified, right thigh: Secondary | ICD-10-CM | POA: Diagnosis not present

## 2021-03-10 DIAGNOSIS — M7071 Other bursitis of hip, right hip: Secondary | ICD-10-CM | POA: Diagnosis not present

## 2021-03-10 DIAGNOSIS — M25551 Pain in right hip: Secondary | ICD-10-CM | POA: Diagnosis not present

## 2021-03-10 DIAGNOSIS — M545 Low back pain, unspecified: Secondary | ICD-10-CM | POA: Diagnosis not present

## 2021-03-10 DIAGNOSIS — M25552 Pain in left hip: Secondary | ICD-10-CM | POA: Diagnosis not present

## 2021-03-10 DIAGNOSIS — M7072 Other bursitis of hip, left hip: Secondary | ICD-10-CM | POA: Diagnosis not present

## 2021-03-13 DIAGNOSIS — M25552 Pain in left hip: Secondary | ICD-10-CM | POA: Diagnosis not present

## 2021-03-13 DIAGNOSIS — M79604 Pain in right leg: Secondary | ICD-10-CM | POA: Diagnosis not present

## 2021-03-13 DIAGNOSIS — M62551 Muscle wasting and atrophy, not elsewhere classified, right thigh: Secondary | ICD-10-CM | POA: Diagnosis not present

## 2021-03-13 DIAGNOSIS — M545 Low back pain, unspecified: Secondary | ICD-10-CM | POA: Diagnosis not present

## 2021-03-13 DIAGNOSIS — R42 Dizziness and giddiness: Secondary | ICD-10-CM | POA: Diagnosis not present

## 2021-03-13 DIAGNOSIS — M7072 Other bursitis of hip, left hip: Secondary | ICD-10-CM | POA: Diagnosis not present

## 2021-03-13 DIAGNOSIS — M7071 Other bursitis of hip, right hip: Secondary | ICD-10-CM | POA: Diagnosis not present

## 2021-03-13 DIAGNOSIS — M25551 Pain in right hip: Secondary | ICD-10-CM | POA: Diagnosis not present

## 2021-03-15 DIAGNOSIS — M7071 Other bursitis of hip, right hip: Secondary | ICD-10-CM | POA: Diagnosis not present

## 2021-03-15 DIAGNOSIS — M79604 Pain in right leg: Secondary | ICD-10-CM | POA: Diagnosis not present

## 2021-03-15 DIAGNOSIS — M62551 Muscle wasting and atrophy, not elsewhere classified, right thigh: Secondary | ICD-10-CM | POA: Diagnosis not present

## 2021-03-15 DIAGNOSIS — M25552 Pain in left hip: Secondary | ICD-10-CM | POA: Diagnosis not present

## 2021-03-15 DIAGNOSIS — M7072 Other bursitis of hip, left hip: Secondary | ICD-10-CM | POA: Diagnosis not present

## 2021-03-15 DIAGNOSIS — R42 Dizziness and giddiness: Secondary | ICD-10-CM | POA: Diagnosis not present

## 2021-03-15 DIAGNOSIS — M545 Low back pain, unspecified: Secondary | ICD-10-CM | POA: Diagnosis not present

## 2021-03-15 DIAGNOSIS — M25551 Pain in right hip: Secondary | ICD-10-CM | POA: Diagnosis not present

## 2021-03-20 DIAGNOSIS — M7071 Other bursitis of hip, right hip: Secondary | ICD-10-CM | POA: Diagnosis not present

## 2021-03-20 DIAGNOSIS — M7072 Other bursitis of hip, left hip: Secondary | ICD-10-CM | POA: Diagnosis not present

## 2021-03-20 DIAGNOSIS — M25551 Pain in right hip: Secondary | ICD-10-CM | POA: Diagnosis not present

## 2021-03-20 DIAGNOSIS — M545 Low back pain, unspecified: Secondary | ICD-10-CM | POA: Diagnosis not present

## 2021-03-20 DIAGNOSIS — M79604 Pain in right leg: Secondary | ICD-10-CM | POA: Diagnosis not present

## 2021-03-20 DIAGNOSIS — M62551 Muscle wasting and atrophy, not elsewhere classified, right thigh: Secondary | ICD-10-CM | POA: Diagnosis not present

## 2021-03-20 DIAGNOSIS — R42 Dizziness and giddiness: Secondary | ICD-10-CM | POA: Diagnosis not present

## 2021-03-20 DIAGNOSIS — M25552 Pain in left hip: Secondary | ICD-10-CM | POA: Diagnosis not present

## 2021-03-22 DIAGNOSIS — M7072 Other bursitis of hip, left hip: Secondary | ICD-10-CM | POA: Diagnosis not present

## 2021-03-22 DIAGNOSIS — M25552 Pain in left hip: Secondary | ICD-10-CM | POA: Diagnosis not present

## 2021-03-22 DIAGNOSIS — R42 Dizziness and giddiness: Secondary | ICD-10-CM | POA: Diagnosis not present

## 2021-03-22 DIAGNOSIS — M79604 Pain in right leg: Secondary | ICD-10-CM | POA: Diagnosis not present

## 2021-03-22 DIAGNOSIS — M545 Low back pain, unspecified: Secondary | ICD-10-CM | POA: Diagnosis not present

## 2021-03-22 DIAGNOSIS — M62551 Muscle wasting and atrophy, not elsewhere classified, right thigh: Secondary | ICD-10-CM | POA: Diagnosis not present

## 2021-03-22 DIAGNOSIS — M25551 Pain in right hip: Secondary | ICD-10-CM | POA: Diagnosis not present

## 2021-03-22 DIAGNOSIS — M7071 Other bursitis of hip, right hip: Secondary | ICD-10-CM | POA: Diagnosis not present

## 2021-03-27 DIAGNOSIS — M79604 Pain in right leg: Secondary | ICD-10-CM | POA: Diagnosis not present

## 2021-03-27 DIAGNOSIS — M545 Low back pain, unspecified: Secondary | ICD-10-CM | POA: Diagnosis not present

## 2021-03-27 DIAGNOSIS — M7072 Other bursitis of hip, left hip: Secondary | ICD-10-CM | POA: Diagnosis not present

## 2021-03-27 DIAGNOSIS — M62551 Muscle wasting and atrophy, not elsewhere classified, right thigh: Secondary | ICD-10-CM | POA: Diagnosis not present

## 2021-03-27 DIAGNOSIS — M25551 Pain in right hip: Secondary | ICD-10-CM | POA: Diagnosis not present

## 2021-03-27 DIAGNOSIS — M25552 Pain in left hip: Secondary | ICD-10-CM | POA: Diagnosis not present

## 2021-03-27 DIAGNOSIS — M7071 Other bursitis of hip, right hip: Secondary | ICD-10-CM | POA: Diagnosis not present

## 2021-03-27 DIAGNOSIS — R42 Dizziness and giddiness: Secondary | ICD-10-CM | POA: Diagnosis not present

## 2021-03-29 DIAGNOSIS — M79604 Pain in right leg: Secondary | ICD-10-CM | POA: Diagnosis not present

## 2021-03-29 DIAGNOSIS — M545 Low back pain, unspecified: Secondary | ICD-10-CM | POA: Diagnosis not present

## 2021-03-29 DIAGNOSIS — M7072 Other bursitis of hip, left hip: Secondary | ICD-10-CM | POA: Diagnosis not present

## 2021-03-29 DIAGNOSIS — M25551 Pain in right hip: Secondary | ICD-10-CM | POA: Diagnosis not present

## 2021-03-29 DIAGNOSIS — R42 Dizziness and giddiness: Secondary | ICD-10-CM | POA: Diagnosis not present

## 2021-03-29 DIAGNOSIS — M62551 Muscle wasting and atrophy, not elsewhere classified, right thigh: Secondary | ICD-10-CM | POA: Diagnosis not present

## 2021-03-29 DIAGNOSIS — M25552 Pain in left hip: Secondary | ICD-10-CM | POA: Diagnosis not present

## 2021-03-29 DIAGNOSIS — M7071 Other bursitis of hip, right hip: Secondary | ICD-10-CM | POA: Diagnosis not present

## 2021-04-03 DIAGNOSIS — M62551 Muscle wasting and atrophy, not elsewhere classified, right thigh: Secondary | ICD-10-CM | POA: Diagnosis not present

## 2021-04-03 DIAGNOSIS — M25551 Pain in right hip: Secondary | ICD-10-CM | POA: Diagnosis not present

## 2021-04-03 DIAGNOSIS — M7072 Other bursitis of hip, left hip: Secondary | ICD-10-CM | POA: Diagnosis not present

## 2021-04-03 DIAGNOSIS — M25552 Pain in left hip: Secondary | ICD-10-CM | POA: Diagnosis not present

## 2021-04-03 DIAGNOSIS — M7071 Other bursitis of hip, right hip: Secondary | ICD-10-CM | POA: Diagnosis not present

## 2021-04-03 DIAGNOSIS — M79604 Pain in right leg: Secondary | ICD-10-CM | POA: Diagnosis not present

## 2021-04-03 DIAGNOSIS — M545 Low back pain, unspecified: Secondary | ICD-10-CM | POA: Diagnosis not present

## 2021-04-03 DIAGNOSIS — R42 Dizziness and giddiness: Secondary | ICD-10-CM | POA: Diagnosis not present

## 2021-04-06 DIAGNOSIS — Z96641 Presence of right artificial hip joint: Secondary | ICD-10-CM | POA: Diagnosis not present

## 2021-04-07 DIAGNOSIS — M25552 Pain in left hip: Secondary | ICD-10-CM | POA: Diagnosis not present

## 2021-04-07 DIAGNOSIS — M62551 Muscle wasting and atrophy, not elsewhere classified, right thigh: Secondary | ICD-10-CM | POA: Diagnosis not present

## 2021-04-07 DIAGNOSIS — M7071 Other bursitis of hip, right hip: Secondary | ICD-10-CM | POA: Diagnosis not present

## 2021-04-07 DIAGNOSIS — M7072 Other bursitis of hip, left hip: Secondary | ICD-10-CM | POA: Diagnosis not present

## 2021-04-07 DIAGNOSIS — M25551 Pain in right hip: Secondary | ICD-10-CM | POA: Diagnosis not present

## 2021-04-07 DIAGNOSIS — M545 Low back pain, unspecified: Secondary | ICD-10-CM | POA: Diagnosis not present

## 2021-04-07 DIAGNOSIS — R42 Dizziness and giddiness: Secondary | ICD-10-CM | POA: Diagnosis not present

## 2021-04-07 DIAGNOSIS — M79604 Pain in right leg: Secondary | ICD-10-CM | POA: Diagnosis not present

## 2021-04-10 DIAGNOSIS — M545 Low back pain, unspecified: Secondary | ICD-10-CM | POA: Diagnosis not present

## 2021-04-10 DIAGNOSIS — M7072 Other bursitis of hip, left hip: Secondary | ICD-10-CM | POA: Diagnosis not present

## 2021-04-10 DIAGNOSIS — R42 Dizziness and giddiness: Secondary | ICD-10-CM | POA: Diagnosis not present

## 2021-04-10 DIAGNOSIS — M7071 Other bursitis of hip, right hip: Secondary | ICD-10-CM | POA: Diagnosis not present

## 2021-04-10 DIAGNOSIS — M79604 Pain in right leg: Secondary | ICD-10-CM | POA: Diagnosis not present

## 2021-04-10 DIAGNOSIS — M25552 Pain in left hip: Secondary | ICD-10-CM | POA: Diagnosis not present

## 2021-04-10 DIAGNOSIS — M62551 Muscle wasting and atrophy, not elsewhere classified, right thigh: Secondary | ICD-10-CM | POA: Diagnosis not present

## 2021-04-10 DIAGNOSIS — M25551 Pain in right hip: Secondary | ICD-10-CM | POA: Diagnosis not present

## 2021-04-12 DIAGNOSIS — M545 Low back pain, unspecified: Secondary | ICD-10-CM | POA: Diagnosis not present

## 2021-04-12 DIAGNOSIS — M25551 Pain in right hip: Secondary | ICD-10-CM | POA: Diagnosis not present

## 2021-04-12 DIAGNOSIS — M7072 Other bursitis of hip, left hip: Secondary | ICD-10-CM | POA: Diagnosis not present

## 2021-04-12 DIAGNOSIS — R42 Dizziness and giddiness: Secondary | ICD-10-CM | POA: Diagnosis not present

## 2021-04-12 DIAGNOSIS — M62551 Muscle wasting and atrophy, not elsewhere classified, right thigh: Secondary | ICD-10-CM | POA: Diagnosis not present

## 2021-04-12 DIAGNOSIS — M79604 Pain in right leg: Secondary | ICD-10-CM | POA: Diagnosis not present

## 2021-04-12 DIAGNOSIS — M25552 Pain in left hip: Secondary | ICD-10-CM | POA: Diagnosis not present

## 2021-04-12 DIAGNOSIS — M7071 Other bursitis of hip, right hip: Secondary | ICD-10-CM | POA: Diagnosis not present

## 2021-04-17 DIAGNOSIS — M79604 Pain in right leg: Secondary | ICD-10-CM | POA: Diagnosis not present

## 2021-04-17 DIAGNOSIS — M25552 Pain in left hip: Secondary | ICD-10-CM | POA: Diagnosis not present

## 2021-04-17 DIAGNOSIS — M7072 Other bursitis of hip, left hip: Secondary | ICD-10-CM | POA: Diagnosis not present

## 2021-04-17 DIAGNOSIS — R42 Dizziness and giddiness: Secondary | ICD-10-CM | POA: Diagnosis not present

## 2021-04-17 DIAGNOSIS — M7071 Other bursitis of hip, right hip: Secondary | ICD-10-CM | POA: Diagnosis not present

## 2021-04-17 DIAGNOSIS — M25551 Pain in right hip: Secondary | ICD-10-CM | POA: Diagnosis not present

## 2021-04-17 DIAGNOSIS — M545 Low back pain, unspecified: Secondary | ICD-10-CM | POA: Diagnosis not present

## 2021-04-17 DIAGNOSIS — M62551 Muscle wasting and atrophy, not elsewhere classified, right thigh: Secondary | ICD-10-CM | POA: Diagnosis not present

## 2021-04-20 DIAGNOSIS — M7072 Other bursitis of hip, left hip: Secondary | ICD-10-CM | POA: Diagnosis not present

## 2021-04-20 DIAGNOSIS — M62551 Muscle wasting and atrophy, not elsewhere classified, right thigh: Secondary | ICD-10-CM | POA: Diagnosis not present

## 2021-04-20 DIAGNOSIS — M25551 Pain in right hip: Secondary | ICD-10-CM | POA: Diagnosis not present

## 2021-04-20 DIAGNOSIS — M7071 Other bursitis of hip, right hip: Secondary | ICD-10-CM | POA: Diagnosis not present

## 2021-04-20 DIAGNOSIS — M25552 Pain in left hip: Secondary | ICD-10-CM | POA: Diagnosis not present

## 2021-04-20 DIAGNOSIS — R42 Dizziness and giddiness: Secondary | ICD-10-CM | POA: Diagnosis not present

## 2021-04-20 DIAGNOSIS — M545 Low back pain, unspecified: Secondary | ICD-10-CM | POA: Diagnosis not present

## 2021-04-20 DIAGNOSIS — M79604 Pain in right leg: Secondary | ICD-10-CM | POA: Diagnosis not present

## 2021-04-25 DIAGNOSIS — M7071 Other bursitis of hip, right hip: Secondary | ICD-10-CM | POA: Diagnosis not present

## 2021-04-25 DIAGNOSIS — M62551 Muscle wasting and atrophy, not elsewhere classified, right thigh: Secondary | ICD-10-CM | POA: Diagnosis not present

## 2021-04-25 DIAGNOSIS — M7072 Other bursitis of hip, left hip: Secondary | ICD-10-CM | POA: Diagnosis not present

## 2021-04-25 DIAGNOSIS — M545 Low back pain, unspecified: Secondary | ICD-10-CM | POA: Diagnosis not present

## 2021-04-25 DIAGNOSIS — M25551 Pain in right hip: Secondary | ICD-10-CM | POA: Diagnosis not present

## 2021-04-25 DIAGNOSIS — M79604 Pain in right leg: Secondary | ICD-10-CM | POA: Diagnosis not present

## 2021-04-25 DIAGNOSIS — R42 Dizziness and giddiness: Secondary | ICD-10-CM | POA: Diagnosis not present

## 2021-04-25 DIAGNOSIS — M25552 Pain in left hip: Secondary | ICD-10-CM | POA: Diagnosis not present

## 2021-04-27 DIAGNOSIS — M25552 Pain in left hip: Secondary | ICD-10-CM | POA: Diagnosis not present

## 2021-04-27 DIAGNOSIS — M79604 Pain in right leg: Secondary | ICD-10-CM | POA: Diagnosis not present

## 2021-04-27 DIAGNOSIS — M62551 Muscle wasting and atrophy, not elsewhere classified, right thigh: Secondary | ICD-10-CM | POA: Diagnosis not present

## 2021-04-27 DIAGNOSIS — M25551 Pain in right hip: Secondary | ICD-10-CM | POA: Diagnosis not present

## 2021-04-27 DIAGNOSIS — M7071 Other bursitis of hip, right hip: Secondary | ICD-10-CM | POA: Diagnosis not present

## 2021-04-27 DIAGNOSIS — R42 Dizziness and giddiness: Secondary | ICD-10-CM | POA: Diagnosis not present

## 2021-04-27 DIAGNOSIS — M545 Low back pain, unspecified: Secondary | ICD-10-CM | POA: Diagnosis not present

## 2021-04-27 DIAGNOSIS — M7072 Other bursitis of hip, left hip: Secondary | ICD-10-CM | POA: Diagnosis not present

## 2021-05-02 DIAGNOSIS — R42 Dizziness and giddiness: Secondary | ICD-10-CM | POA: Diagnosis not present

## 2021-05-02 DIAGNOSIS — M25551 Pain in right hip: Secondary | ICD-10-CM | POA: Diagnosis not present

## 2021-05-02 DIAGNOSIS — M545 Low back pain, unspecified: Secondary | ICD-10-CM | POA: Diagnosis not present

## 2021-05-02 DIAGNOSIS — M79604 Pain in right leg: Secondary | ICD-10-CM | POA: Diagnosis not present

## 2021-05-02 DIAGNOSIS — M7071 Other bursitis of hip, right hip: Secondary | ICD-10-CM | POA: Diagnosis not present

## 2021-05-02 DIAGNOSIS — M62551 Muscle wasting and atrophy, not elsewhere classified, right thigh: Secondary | ICD-10-CM | POA: Diagnosis not present

## 2021-05-02 DIAGNOSIS — M25552 Pain in left hip: Secondary | ICD-10-CM | POA: Diagnosis not present

## 2021-05-02 DIAGNOSIS — M7072 Other bursitis of hip, left hip: Secondary | ICD-10-CM | POA: Diagnosis not present

## 2021-05-04 DIAGNOSIS — M25552 Pain in left hip: Secondary | ICD-10-CM | POA: Diagnosis not present

## 2021-05-04 DIAGNOSIS — R42 Dizziness and giddiness: Secondary | ICD-10-CM | POA: Diagnosis not present

## 2021-05-04 DIAGNOSIS — M7072 Other bursitis of hip, left hip: Secondary | ICD-10-CM | POA: Diagnosis not present

## 2021-05-04 DIAGNOSIS — M79604 Pain in right leg: Secondary | ICD-10-CM | POA: Diagnosis not present

## 2021-05-04 DIAGNOSIS — M62551 Muscle wasting and atrophy, not elsewhere classified, right thigh: Secondary | ICD-10-CM | POA: Diagnosis not present

## 2021-05-04 DIAGNOSIS — M25551 Pain in right hip: Secondary | ICD-10-CM | POA: Diagnosis not present

## 2021-05-04 DIAGNOSIS — M7071 Other bursitis of hip, right hip: Secondary | ICD-10-CM | POA: Diagnosis not present

## 2021-05-04 DIAGNOSIS — M545 Low back pain, unspecified: Secondary | ICD-10-CM | POA: Diagnosis not present

## 2021-05-05 DIAGNOSIS — Z20822 Contact with and (suspected) exposure to covid-19: Secondary | ICD-10-CM | POA: Diagnosis not present

## 2021-05-08 DIAGNOSIS — N430 Encysted hydrocele: Secondary | ICD-10-CM | POA: Diagnosis not present

## 2021-05-08 DIAGNOSIS — M62551 Muscle wasting and atrophy, not elsewhere classified, right thigh: Secondary | ICD-10-CM | POA: Diagnosis not present

## 2021-05-08 DIAGNOSIS — M25552 Pain in left hip: Secondary | ICD-10-CM | POA: Diagnosis not present

## 2021-05-08 DIAGNOSIS — M7072 Other bursitis of hip, left hip: Secondary | ICD-10-CM | POA: Diagnosis not present

## 2021-05-08 DIAGNOSIS — M25551 Pain in right hip: Secondary | ICD-10-CM | POA: Diagnosis not present

## 2021-05-08 DIAGNOSIS — M79604 Pain in right leg: Secondary | ICD-10-CM | POA: Diagnosis not present

## 2021-05-08 DIAGNOSIS — M7071 Other bursitis of hip, right hip: Secondary | ICD-10-CM | POA: Diagnosis not present

## 2021-05-08 DIAGNOSIS — M545 Low back pain, unspecified: Secondary | ICD-10-CM | POA: Diagnosis not present

## 2021-05-08 DIAGNOSIS — R42 Dizziness and giddiness: Secondary | ICD-10-CM | POA: Diagnosis not present

## 2021-05-10 DIAGNOSIS — M25551 Pain in right hip: Secondary | ICD-10-CM | POA: Diagnosis not present

## 2021-05-10 DIAGNOSIS — M79604 Pain in right leg: Secondary | ICD-10-CM | POA: Diagnosis not present

## 2021-05-10 DIAGNOSIS — M7072 Other bursitis of hip, left hip: Secondary | ICD-10-CM | POA: Diagnosis not present

## 2021-05-10 DIAGNOSIS — M545 Low back pain, unspecified: Secondary | ICD-10-CM | POA: Diagnosis not present

## 2021-05-10 DIAGNOSIS — M62551 Muscle wasting and atrophy, not elsewhere classified, right thigh: Secondary | ICD-10-CM | POA: Diagnosis not present

## 2021-05-10 DIAGNOSIS — M7071 Other bursitis of hip, right hip: Secondary | ICD-10-CM | POA: Diagnosis not present

## 2021-05-10 DIAGNOSIS — M25552 Pain in left hip: Secondary | ICD-10-CM | POA: Diagnosis not present

## 2021-05-10 DIAGNOSIS — R42 Dizziness and giddiness: Secondary | ICD-10-CM | POA: Diagnosis not present

## 2021-05-17 DIAGNOSIS — M62551 Muscle wasting and atrophy, not elsewhere classified, right thigh: Secondary | ICD-10-CM | POA: Diagnosis not present

## 2021-05-17 DIAGNOSIS — M79604 Pain in right leg: Secondary | ICD-10-CM | POA: Diagnosis not present

## 2021-05-17 DIAGNOSIS — M25552 Pain in left hip: Secondary | ICD-10-CM | POA: Diagnosis not present

## 2021-05-17 DIAGNOSIS — M25551 Pain in right hip: Secondary | ICD-10-CM | POA: Diagnosis not present

## 2021-05-17 DIAGNOSIS — M7072 Other bursitis of hip, left hip: Secondary | ICD-10-CM | POA: Diagnosis not present

## 2021-05-17 DIAGNOSIS — R42 Dizziness and giddiness: Secondary | ICD-10-CM | POA: Diagnosis not present

## 2021-05-17 DIAGNOSIS — M7071 Other bursitis of hip, right hip: Secondary | ICD-10-CM | POA: Diagnosis not present

## 2021-05-17 DIAGNOSIS — M545 Low back pain, unspecified: Secondary | ICD-10-CM | POA: Diagnosis not present

## 2021-06-02 DIAGNOSIS — Z79899 Other long term (current) drug therapy: Secondary | ICD-10-CM | POA: Diagnosis not present

## 2021-06-02 DIAGNOSIS — E1169 Type 2 diabetes mellitus with other specified complication: Secondary | ICD-10-CM | POA: Diagnosis not present

## 2021-06-02 DIAGNOSIS — H4010X0 Unspecified open-angle glaucoma, stage unspecified: Secondary | ICD-10-CM | POA: Diagnosis not present

## 2021-06-02 DIAGNOSIS — E785 Hyperlipidemia, unspecified: Secondary | ICD-10-CM | POA: Diagnosis not present

## 2021-06-02 DIAGNOSIS — I10 Essential (primary) hypertension: Secondary | ICD-10-CM | POA: Diagnosis not present

## 2021-06-02 DIAGNOSIS — Z0000 Encounter for general adult medical examination without abnormal findings: Secondary | ICD-10-CM | POA: Diagnosis not present

## 2021-06-22 DIAGNOSIS — M545 Low back pain, unspecified: Secondary | ICD-10-CM | POA: Diagnosis not present

## 2021-06-22 DIAGNOSIS — M25551 Pain in right hip: Secondary | ICD-10-CM | POA: Diagnosis not present

## 2021-06-28 DIAGNOSIS — J40 Bronchitis, not specified as acute or chronic: Secondary | ICD-10-CM | POA: Diagnosis not present

## 2021-06-28 DIAGNOSIS — J329 Chronic sinusitis, unspecified: Secondary | ICD-10-CM | POA: Diagnosis not present

## 2021-06-28 DIAGNOSIS — E785 Hyperlipidemia, unspecified: Secondary | ICD-10-CM | POA: Diagnosis not present

## 2021-06-28 DIAGNOSIS — Z6830 Body mass index (BMI) 30.0-30.9, adult: Secondary | ICD-10-CM | POA: Diagnosis not present

## 2021-06-28 DIAGNOSIS — E1169 Type 2 diabetes mellitus with other specified complication: Secondary | ICD-10-CM | POA: Diagnosis not present

## 2021-06-28 DIAGNOSIS — J302 Other seasonal allergic rhinitis: Secondary | ICD-10-CM | POA: Diagnosis not present

## 2021-07-28 DIAGNOSIS — E785 Hyperlipidemia, unspecified: Secondary | ICD-10-CM | POA: Diagnosis not present

## 2021-07-28 DIAGNOSIS — I10 Essential (primary) hypertension: Secondary | ICD-10-CM | POA: Diagnosis not present

## 2021-07-31 DIAGNOSIS — E1169 Type 2 diabetes mellitus with other specified complication: Secondary | ICD-10-CM | POA: Diagnosis not present

## 2021-07-31 DIAGNOSIS — E785 Hyperlipidemia, unspecified: Secondary | ICD-10-CM | POA: Diagnosis not present

## 2021-08-10 DIAGNOSIS — M5416 Radiculopathy, lumbar region: Secondary | ICD-10-CM | POA: Diagnosis not present

## 2021-08-10 DIAGNOSIS — M7061 Trochanteric bursitis, right hip: Secondary | ICD-10-CM | POA: Diagnosis not present

## 2021-08-18 DIAGNOSIS — M7061 Trochanteric bursitis, right hip: Secondary | ICD-10-CM | POA: Diagnosis not present

## 2021-08-30 DIAGNOSIS — E785 Hyperlipidemia, unspecified: Secondary | ICD-10-CM | POA: Diagnosis not present

## 2021-08-30 DIAGNOSIS — D692 Other nonthrombocytopenic purpura: Secondary | ICD-10-CM | POA: Diagnosis not present

## 2021-08-30 DIAGNOSIS — N3941 Urge incontinence: Secondary | ICD-10-CM | POA: Diagnosis not present

## 2021-08-30 DIAGNOSIS — G250 Essential tremor: Secondary | ICD-10-CM | POA: Diagnosis not present

## 2021-08-30 DIAGNOSIS — N430 Encysted hydrocele: Secondary | ICD-10-CM | POA: Diagnosis not present

## 2021-08-30 DIAGNOSIS — E1169 Type 2 diabetes mellitus with other specified complication: Secondary | ICD-10-CM | POA: Diagnosis not present

## 2021-08-30 DIAGNOSIS — I10 Essential (primary) hypertension: Secondary | ICD-10-CM | POA: Diagnosis not present

## 2021-09-04 DIAGNOSIS — N430 Encysted hydrocele: Secondary | ICD-10-CM | POA: Diagnosis not present

## 2021-09-07 DIAGNOSIS — M7061 Trochanteric bursitis, right hip: Secondary | ICD-10-CM | POA: Diagnosis not present

## 2021-09-07 DIAGNOSIS — Z20822 Contact with and (suspected) exposure to covid-19: Secondary | ICD-10-CM | POA: Diagnosis not present

## 2021-09-07 DIAGNOSIS — M5416 Radiculopathy, lumbar region: Secondary | ICD-10-CM | POA: Diagnosis not present

## 2021-09-11 DIAGNOSIS — M545 Low back pain, unspecified: Secondary | ICD-10-CM | POA: Diagnosis not present

## 2021-09-20 DIAGNOSIS — M5416 Radiculopathy, lumbar region: Secondary | ICD-10-CM | POA: Diagnosis not present

## 2021-09-22 DIAGNOSIS — M5416 Radiculopathy, lumbar region: Secondary | ICD-10-CM | POA: Diagnosis not present

## 2021-09-27 DIAGNOSIS — R350 Frequency of micturition: Secondary | ICD-10-CM | POA: Diagnosis not present

## 2021-09-27 DIAGNOSIS — N1831 Chronic kidney disease, stage 3a: Secondary | ICD-10-CM | POA: Diagnosis not present

## 2021-09-27 DIAGNOSIS — E1142 Type 2 diabetes mellitus with diabetic polyneuropathy: Secondary | ICD-10-CM | POA: Diagnosis not present

## 2021-09-27 DIAGNOSIS — E1169 Type 2 diabetes mellitus with other specified complication: Secondary | ICD-10-CM | POA: Diagnosis not present

## 2021-09-27 DIAGNOSIS — Z125 Encounter for screening for malignant neoplasm of prostate: Secondary | ICD-10-CM | POA: Diagnosis not present

## 2021-09-27 DIAGNOSIS — E785 Hyperlipidemia, unspecified: Secondary | ICD-10-CM | POA: Diagnosis not present

## 2021-11-06 DIAGNOSIS — M5416 Radiculopathy, lumbar region: Secondary | ICD-10-CM | POA: Diagnosis not present

## 2021-11-08 DIAGNOSIS — M7061 Trochanteric bursitis, right hip: Secondary | ICD-10-CM | POA: Diagnosis not present

## 2021-11-30 DIAGNOSIS — M25551 Pain in right hip: Secondary | ICD-10-CM | POA: Diagnosis not present

## 2021-11-30 DIAGNOSIS — M7061 Trochanteric bursitis, right hip: Secondary | ICD-10-CM | POA: Diagnosis not present

## 2021-12-13 DIAGNOSIS — N1831 Chronic kidney disease, stage 3a: Secondary | ICD-10-CM | POA: Diagnosis not present

## 2021-12-13 DIAGNOSIS — E785 Hyperlipidemia, unspecified: Secondary | ICD-10-CM | POA: Diagnosis not present

## 2021-12-13 DIAGNOSIS — E1169 Type 2 diabetes mellitus with other specified complication: Secondary | ICD-10-CM | POA: Diagnosis not present

## 2021-12-13 DIAGNOSIS — E1142 Type 2 diabetes mellitus with diabetic polyneuropathy: Secondary | ICD-10-CM | POA: Diagnosis not present

## 2021-12-14 DIAGNOSIS — M25351 Other instability, right hip: Secondary | ICD-10-CM | POA: Diagnosis not present

## 2021-12-14 DIAGNOSIS — M48061 Spinal stenosis, lumbar region without neurogenic claudication: Secondary | ICD-10-CM | POA: Diagnosis not present

## 2021-12-14 DIAGNOSIS — R2689 Other abnormalities of gait and mobility: Secondary | ICD-10-CM | POA: Diagnosis not present

## 2021-12-14 DIAGNOSIS — M6281 Muscle weakness (generalized): Secondary | ICD-10-CM | POA: Diagnosis not present

## 2021-12-18 DIAGNOSIS — M48061 Spinal stenosis, lumbar region without neurogenic claudication: Secondary | ICD-10-CM | POA: Diagnosis not present

## 2021-12-18 DIAGNOSIS — M6281 Muscle weakness (generalized): Secondary | ICD-10-CM | POA: Diagnosis not present

## 2021-12-18 DIAGNOSIS — M25351 Other instability, right hip: Secondary | ICD-10-CM | POA: Diagnosis not present

## 2021-12-18 DIAGNOSIS — R2689 Other abnormalities of gait and mobility: Secondary | ICD-10-CM | POA: Diagnosis not present

## 2021-12-20 DIAGNOSIS — G8929 Other chronic pain: Secondary | ICD-10-CM | POA: Diagnosis not present

## 2021-12-20 DIAGNOSIS — M5416 Radiculopathy, lumbar region: Secondary | ICD-10-CM | POA: Diagnosis not present

## 2021-12-21 DIAGNOSIS — M6281 Muscle weakness (generalized): Secondary | ICD-10-CM | POA: Diagnosis not present

## 2021-12-21 DIAGNOSIS — M48061 Spinal stenosis, lumbar region without neurogenic claudication: Secondary | ICD-10-CM | POA: Diagnosis not present

## 2021-12-21 DIAGNOSIS — R2689 Other abnormalities of gait and mobility: Secondary | ICD-10-CM | POA: Diagnosis not present

## 2021-12-21 DIAGNOSIS — M25351 Other instability, right hip: Secondary | ICD-10-CM | POA: Diagnosis not present

## 2021-12-25 DIAGNOSIS — M6281 Muscle weakness (generalized): Secondary | ICD-10-CM | POA: Diagnosis not present

## 2021-12-25 DIAGNOSIS — R2689 Other abnormalities of gait and mobility: Secondary | ICD-10-CM | POA: Diagnosis not present

## 2021-12-25 DIAGNOSIS — M25351 Other instability, right hip: Secondary | ICD-10-CM | POA: Diagnosis not present

## 2021-12-25 DIAGNOSIS — M48061 Spinal stenosis, lumbar region without neurogenic claudication: Secondary | ICD-10-CM | POA: Diagnosis not present

## 2021-12-28 DIAGNOSIS — I10 Essential (primary) hypertension: Secondary | ICD-10-CM | POA: Diagnosis not present

## 2021-12-28 DIAGNOSIS — E785 Hyperlipidemia, unspecified: Secondary | ICD-10-CM | POA: Diagnosis not present

## 2021-12-29 DIAGNOSIS — Z6829 Body mass index (BMI) 29.0-29.9, adult: Secondary | ICD-10-CM | POA: Diagnosis not present

## 2021-12-29 DIAGNOSIS — R42 Dizziness and giddiness: Secondary | ICD-10-CM | POA: Diagnosis not present

## 2021-12-29 DIAGNOSIS — I10 Essential (primary) hypertension: Secondary | ICD-10-CM | POA: Diagnosis not present

## 2021-12-29 DIAGNOSIS — I951 Orthostatic hypotension: Secondary | ICD-10-CM | POA: Diagnosis not present

## 2022-01-03 DIAGNOSIS — M25351 Other instability, right hip: Secondary | ICD-10-CM | POA: Diagnosis not present

## 2022-01-03 DIAGNOSIS — R2689 Other abnormalities of gait and mobility: Secondary | ICD-10-CM | POA: Diagnosis not present

## 2022-01-03 DIAGNOSIS — M6281 Muscle weakness (generalized): Secondary | ICD-10-CM | POA: Diagnosis not present

## 2022-01-03 DIAGNOSIS — M48061 Spinal stenosis, lumbar region without neurogenic claudication: Secondary | ICD-10-CM | POA: Diagnosis not present

## 2022-01-05 DIAGNOSIS — M25351 Other instability, right hip: Secondary | ICD-10-CM | POA: Diagnosis not present

## 2022-01-05 DIAGNOSIS — M6281 Muscle weakness (generalized): Secondary | ICD-10-CM | POA: Diagnosis not present

## 2022-01-05 DIAGNOSIS — R2689 Other abnormalities of gait and mobility: Secondary | ICD-10-CM | POA: Diagnosis not present

## 2022-01-05 DIAGNOSIS — M48061 Spinal stenosis, lumbar region without neurogenic claudication: Secondary | ICD-10-CM | POA: Diagnosis not present

## 2022-01-11 DIAGNOSIS — M48061 Spinal stenosis, lumbar region without neurogenic claudication: Secondary | ICD-10-CM | POA: Diagnosis not present

## 2022-01-11 DIAGNOSIS — R2689 Other abnormalities of gait and mobility: Secondary | ICD-10-CM | POA: Diagnosis not present

## 2022-01-11 DIAGNOSIS — M6281 Muscle weakness (generalized): Secondary | ICD-10-CM | POA: Diagnosis not present

## 2022-01-11 DIAGNOSIS — M25351 Other instability, right hip: Secondary | ICD-10-CM | POA: Diagnosis not present

## 2022-01-15 DIAGNOSIS — R2689 Other abnormalities of gait and mobility: Secondary | ICD-10-CM | POA: Diagnosis not present

## 2022-01-15 DIAGNOSIS — M48061 Spinal stenosis, lumbar region without neurogenic claudication: Secondary | ICD-10-CM | POA: Diagnosis not present

## 2022-01-15 DIAGNOSIS — M6281 Muscle weakness (generalized): Secondary | ICD-10-CM | POA: Diagnosis not present

## 2022-01-15 DIAGNOSIS — M25351 Other instability, right hip: Secondary | ICD-10-CM | POA: Diagnosis not present

## 2022-01-17 DIAGNOSIS — Z9889 Other specified postprocedural states: Secondary | ICD-10-CM | POA: Diagnosis not present

## 2022-01-17 DIAGNOSIS — M47816 Spondylosis without myelopathy or radiculopathy, lumbar region: Secondary | ICD-10-CM | POA: Diagnosis not present

## 2022-01-18 DIAGNOSIS — R2689 Other abnormalities of gait and mobility: Secondary | ICD-10-CM | POA: Diagnosis not present

## 2022-01-18 DIAGNOSIS — M25351 Other instability, right hip: Secondary | ICD-10-CM | POA: Diagnosis not present

## 2022-01-18 DIAGNOSIS — M48061 Spinal stenosis, lumbar region without neurogenic claudication: Secondary | ICD-10-CM | POA: Diagnosis not present

## 2022-01-18 DIAGNOSIS — M6281 Muscle weakness (generalized): Secondary | ICD-10-CM | POA: Diagnosis not present

## 2022-01-30 DIAGNOSIS — M47816 Spondylosis without myelopathy or radiculopathy, lumbar region: Secondary | ICD-10-CM | POA: Diagnosis not present

## 2022-02-05 DIAGNOSIS — M48061 Spinal stenosis, lumbar region without neurogenic claudication: Secondary | ICD-10-CM | POA: Diagnosis not present

## 2022-02-05 DIAGNOSIS — M6281 Muscle weakness (generalized): Secondary | ICD-10-CM | POA: Diagnosis not present

## 2022-02-05 DIAGNOSIS — M25351 Other instability, right hip: Secondary | ICD-10-CM | POA: Diagnosis not present

## 2022-02-05 DIAGNOSIS — R2689 Other abnormalities of gait and mobility: Secondary | ICD-10-CM | POA: Diagnosis not present

## 2022-02-07 DIAGNOSIS — I10 Essential (primary) hypertension: Secondary | ICD-10-CM | POA: Diagnosis not present

## 2022-02-07 DIAGNOSIS — G250 Essential tremor: Secondary | ICD-10-CM | POA: Diagnosis not present

## 2022-02-07 DIAGNOSIS — E1169 Type 2 diabetes mellitus with other specified complication: Secondary | ICD-10-CM | POA: Diagnosis not present

## 2022-02-07 DIAGNOSIS — E785 Hyperlipidemia, unspecified: Secondary | ICD-10-CM | POA: Diagnosis not present

## 2022-02-09 DIAGNOSIS — M25351 Other instability, right hip: Secondary | ICD-10-CM | POA: Diagnosis not present

## 2022-02-09 DIAGNOSIS — M48061 Spinal stenosis, lumbar region without neurogenic claudication: Secondary | ICD-10-CM | POA: Diagnosis not present

## 2022-02-09 DIAGNOSIS — R2689 Other abnormalities of gait and mobility: Secondary | ICD-10-CM | POA: Diagnosis not present

## 2022-02-09 DIAGNOSIS — M6281 Muscle weakness (generalized): Secondary | ICD-10-CM | POA: Diagnosis not present

## 2022-02-12 DIAGNOSIS — M25351 Other instability, right hip: Secondary | ICD-10-CM | POA: Diagnosis not present

## 2022-02-12 DIAGNOSIS — M48061 Spinal stenosis, lumbar region without neurogenic claudication: Secondary | ICD-10-CM | POA: Diagnosis not present

## 2022-02-12 DIAGNOSIS — M6281 Muscle weakness (generalized): Secondary | ICD-10-CM | POA: Diagnosis not present

## 2022-02-12 DIAGNOSIS — R2689 Other abnormalities of gait and mobility: Secondary | ICD-10-CM | POA: Diagnosis not present

## 2022-02-19 DIAGNOSIS — M25351 Other instability, right hip: Secondary | ICD-10-CM | POA: Diagnosis not present

## 2022-02-19 DIAGNOSIS — R2689 Other abnormalities of gait and mobility: Secondary | ICD-10-CM | POA: Diagnosis not present

## 2022-02-19 DIAGNOSIS — M6281 Muscle weakness (generalized): Secondary | ICD-10-CM | POA: Diagnosis not present

## 2022-02-19 DIAGNOSIS — M48061 Spinal stenosis, lumbar region without neurogenic claudication: Secondary | ICD-10-CM | POA: Diagnosis not present

## 2022-02-20 DIAGNOSIS — M47816 Spondylosis without myelopathy or radiculopathy, lumbar region: Secondary | ICD-10-CM | POA: Diagnosis not present

## 2022-02-22 DIAGNOSIS — R2689 Other abnormalities of gait and mobility: Secondary | ICD-10-CM | POA: Diagnosis not present

## 2022-02-22 DIAGNOSIS — M6281 Muscle weakness (generalized): Secondary | ICD-10-CM | POA: Diagnosis not present

## 2022-02-22 DIAGNOSIS — M25351 Other instability, right hip: Secondary | ICD-10-CM | POA: Diagnosis not present

## 2022-02-22 DIAGNOSIS — M48061 Spinal stenosis, lumbar region without neurogenic claudication: Secondary | ICD-10-CM | POA: Diagnosis not present

## 2022-03-01 DIAGNOSIS — M48061 Spinal stenosis, lumbar region without neurogenic claudication: Secondary | ICD-10-CM | POA: Diagnosis not present

## 2022-03-01 DIAGNOSIS — R2689 Other abnormalities of gait and mobility: Secondary | ICD-10-CM | POA: Diagnosis not present

## 2022-03-01 DIAGNOSIS — M6281 Muscle weakness (generalized): Secondary | ICD-10-CM | POA: Diagnosis not present

## 2022-03-01 DIAGNOSIS — M25351 Other instability, right hip: Secondary | ICD-10-CM | POA: Diagnosis not present

## 2022-03-05 DIAGNOSIS — M25351 Other instability, right hip: Secondary | ICD-10-CM | POA: Diagnosis not present

## 2022-03-05 DIAGNOSIS — M48061 Spinal stenosis, lumbar region without neurogenic claudication: Secondary | ICD-10-CM | POA: Diagnosis not present

## 2022-03-05 DIAGNOSIS — R2689 Other abnormalities of gait and mobility: Secondary | ICD-10-CM | POA: Diagnosis not present

## 2022-03-05 DIAGNOSIS — M6281 Muscle weakness (generalized): Secondary | ICD-10-CM | POA: Diagnosis not present

## 2022-03-27 DIAGNOSIS — M47816 Spondylosis without myelopathy or radiculopathy, lumbar region: Secondary | ICD-10-CM | POA: Diagnosis not present

## 2022-04-18 DIAGNOSIS — L97509 Non-pressure chronic ulcer of other part of unspecified foot with unspecified severity: Secondary | ICD-10-CM | POA: Diagnosis not present

## 2022-04-18 DIAGNOSIS — E11621 Type 2 diabetes mellitus with foot ulcer: Secondary | ICD-10-CM | POA: Diagnosis not present

## 2022-04-18 DIAGNOSIS — E785 Hyperlipidemia, unspecified: Secondary | ICD-10-CM | POA: Diagnosis not present

## 2022-04-18 DIAGNOSIS — N1831 Chronic kidney disease, stage 3a: Secondary | ICD-10-CM | POA: Diagnosis not present

## 2022-04-18 DIAGNOSIS — E1169 Type 2 diabetes mellitus with other specified complication: Secondary | ICD-10-CM | POA: Diagnosis not present

## 2022-05-10 DIAGNOSIS — B353 Tinea pedis: Secondary | ICD-10-CM | POA: Diagnosis not present

## 2022-05-10 DIAGNOSIS — B351 Tinea unguium: Secondary | ICD-10-CM | POA: Diagnosis not present

## 2022-05-10 DIAGNOSIS — E1140 Type 2 diabetes mellitus with diabetic neuropathy, unspecified: Secondary | ICD-10-CM | POA: Diagnosis not present

## 2022-05-15 DIAGNOSIS — B351 Tinea unguium: Secondary | ICD-10-CM | POA: Diagnosis not present

## 2022-05-15 DIAGNOSIS — B353 Tinea pedis: Secondary | ICD-10-CM | POA: Diagnosis not present

## 2022-05-30 DIAGNOSIS — E1169 Type 2 diabetes mellitus with other specified complication: Secondary | ICD-10-CM | POA: Diagnosis not present

## 2022-05-30 DIAGNOSIS — I10 Essential (primary) hypertension: Secondary | ICD-10-CM | POA: Diagnosis not present

## 2022-05-30 DIAGNOSIS — E785 Hyperlipidemia, unspecified: Secondary | ICD-10-CM | POA: Diagnosis not present

## 2022-06-04 DIAGNOSIS — N1831 Chronic kidney disease, stage 3a: Secondary | ICD-10-CM | POA: Diagnosis not present

## 2022-06-04 DIAGNOSIS — E1142 Type 2 diabetes mellitus with diabetic polyneuropathy: Secondary | ICD-10-CM | POA: Diagnosis not present

## 2022-06-04 DIAGNOSIS — Z0000 Encounter for general adult medical examination without abnormal findings: Secondary | ICD-10-CM | POA: Diagnosis not present

## 2022-06-04 DIAGNOSIS — M791 Myalgia, unspecified site: Secondary | ICD-10-CM | POA: Diagnosis not present

## 2022-06-04 DIAGNOSIS — E785 Hyperlipidemia, unspecified: Secondary | ICD-10-CM | POA: Diagnosis not present

## 2022-06-04 DIAGNOSIS — Z79899 Other long term (current) drug therapy: Secondary | ICD-10-CM | POA: Diagnosis not present

## 2022-06-04 DIAGNOSIS — E1169 Type 2 diabetes mellitus with other specified complication: Secondary | ICD-10-CM | POA: Diagnosis not present

## 2022-06-20 DIAGNOSIS — B351 Tinea unguium: Secondary | ICD-10-CM | POA: Diagnosis not present

## 2022-06-20 DIAGNOSIS — B353 Tinea pedis: Secondary | ICD-10-CM | POA: Diagnosis not present

## 2022-06-28 DIAGNOSIS — I10 Essential (primary) hypertension: Secondary | ICD-10-CM | POA: Diagnosis not present

## 2022-06-28 DIAGNOSIS — E785 Hyperlipidemia, unspecified: Secondary | ICD-10-CM | POA: Diagnosis not present

## 2022-06-28 DIAGNOSIS — E1169 Type 2 diabetes mellitus with other specified complication: Secondary | ICD-10-CM | POA: Diagnosis not present

## 2022-07-18 DIAGNOSIS — R42 Dizziness and giddiness: Secondary | ICD-10-CM | POA: Diagnosis not present

## 2022-07-18 DIAGNOSIS — W19XXXS Unspecified fall, sequela: Secondary | ICD-10-CM | POA: Diagnosis not present

## 2022-07-18 DIAGNOSIS — H8113 Benign paroxysmal vertigo, bilateral: Secondary | ICD-10-CM | POA: Diagnosis not present

## 2022-07-23 DIAGNOSIS — R42 Dizziness and giddiness: Secondary | ICD-10-CM | POA: Diagnosis not present

## 2022-07-23 DIAGNOSIS — W19XXXS Unspecified fall, sequela: Secondary | ICD-10-CM | POA: Diagnosis not present

## 2022-07-23 DIAGNOSIS — H8113 Benign paroxysmal vertigo, bilateral: Secondary | ICD-10-CM | POA: Diagnosis not present

## 2022-07-26 DIAGNOSIS — W19XXXS Unspecified fall, sequela: Secondary | ICD-10-CM | POA: Diagnosis not present

## 2022-07-26 DIAGNOSIS — H8113 Benign paroxysmal vertigo, bilateral: Secondary | ICD-10-CM | POA: Diagnosis not present

## 2022-07-26 DIAGNOSIS — R42 Dizziness and giddiness: Secondary | ICD-10-CM | POA: Diagnosis not present

## 2022-07-30 DIAGNOSIS — R42 Dizziness and giddiness: Secondary | ICD-10-CM | POA: Diagnosis not present

## 2022-07-30 DIAGNOSIS — H8113 Benign paroxysmal vertigo, bilateral: Secondary | ICD-10-CM | POA: Diagnosis not present

## 2022-07-30 DIAGNOSIS — W19XXXS Unspecified fall, sequela: Secondary | ICD-10-CM | POA: Diagnosis not present

## 2022-08-02 DIAGNOSIS — W19XXXS Unspecified fall, sequela: Secondary | ICD-10-CM | POA: Diagnosis not present

## 2022-08-02 DIAGNOSIS — R42 Dizziness and giddiness: Secondary | ICD-10-CM | POA: Diagnosis not present

## 2022-08-02 DIAGNOSIS — H8113 Benign paroxysmal vertigo, bilateral: Secondary | ICD-10-CM | POA: Diagnosis not present

## 2022-08-06 DIAGNOSIS — H8113 Benign paroxysmal vertigo, bilateral: Secondary | ICD-10-CM | POA: Diagnosis not present

## 2022-08-06 DIAGNOSIS — R42 Dizziness and giddiness: Secondary | ICD-10-CM | POA: Diagnosis not present

## 2022-08-06 DIAGNOSIS — W19XXXS Unspecified fall, sequela: Secondary | ICD-10-CM | POA: Diagnosis not present

## 2022-08-09 DIAGNOSIS — R42 Dizziness and giddiness: Secondary | ICD-10-CM | POA: Diagnosis not present

## 2022-08-09 DIAGNOSIS — E1140 Type 2 diabetes mellitus with diabetic neuropathy, unspecified: Secondary | ICD-10-CM | POA: Diagnosis not present

## 2022-08-09 DIAGNOSIS — H8113 Benign paroxysmal vertigo, bilateral: Secondary | ICD-10-CM | POA: Diagnosis not present

## 2022-08-09 DIAGNOSIS — W19XXXS Unspecified fall, sequela: Secondary | ICD-10-CM | POA: Diagnosis not present

## 2022-08-09 DIAGNOSIS — B351 Tinea unguium: Secondary | ICD-10-CM | POA: Diagnosis not present

## 2022-08-09 DIAGNOSIS — B353 Tinea pedis: Secondary | ICD-10-CM | POA: Diagnosis not present

## 2022-08-13 DIAGNOSIS — W19XXXS Unspecified fall, sequela: Secondary | ICD-10-CM | POA: Diagnosis not present

## 2022-08-13 DIAGNOSIS — H8113 Benign paroxysmal vertigo, bilateral: Secondary | ICD-10-CM | POA: Diagnosis not present

## 2022-08-13 DIAGNOSIS — R42 Dizziness and giddiness: Secondary | ICD-10-CM | POA: Diagnosis not present

## 2022-08-16 DIAGNOSIS — W19XXXS Unspecified fall, sequela: Secondary | ICD-10-CM | POA: Diagnosis not present

## 2022-08-16 DIAGNOSIS — R42 Dizziness and giddiness: Secondary | ICD-10-CM | POA: Diagnosis not present

## 2022-08-16 DIAGNOSIS — H8113 Benign paroxysmal vertigo, bilateral: Secondary | ICD-10-CM | POA: Diagnosis not present

## 2022-08-20 DIAGNOSIS — R42 Dizziness and giddiness: Secondary | ICD-10-CM | POA: Diagnosis not present

## 2022-08-20 DIAGNOSIS — H8113 Benign paroxysmal vertigo, bilateral: Secondary | ICD-10-CM | POA: Diagnosis not present

## 2022-08-20 DIAGNOSIS — W19XXXS Unspecified fall, sequela: Secondary | ICD-10-CM | POA: Diagnosis not present

## 2022-08-22 DIAGNOSIS — H8113 Benign paroxysmal vertigo, bilateral: Secondary | ICD-10-CM | POA: Diagnosis not present

## 2022-08-22 DIAGNOSIS — W19XXXS Unspecified fall, sequela: Secondary | ICD-10-CM | POA: Diagnosis not present

## 2022-08-22 DIAGNOSIS — R42 Dizziness and giddiness: Secondary | ICD-10-CM | POA: Diagnosis not present

## 2022-09-03 DIAGNOSIS — E1169 Type 2 diabetes mellitus with other specified complication: Secondary | ICD-10-CM | POA: Diagnosis not present

## 2022-09-03 DIAGNOSIS — N1831 Chronic kidney disease, stage 3a: Secondary | ICD-10-CM | POA: Diagnosis not present

## 2022-09-03 DIAGNOSIS — Z79899 Other long term (current) drug therapy: Secondary | ICD-10-CM | POA: Diagnosis not present

## 2022-09-03 DIAGNOSIS — R42 Dizziness and giddiness: Secondary | ICD-10-CM | POA: Diagnosis not present

## 2022-09-03 DIAGNOSIS — E785 Hyperlipidemia, unspecified: Secondary | ICD-10-CM | POA: Diagnosis not present

## 2022-09-04 ENCOUNTER — Ambulatory Visit: Payer: Medicare Other | Attending: Cardiology | Admitting: Cardiology

## 2022-09-04 ENCOUNTER — Ambulatory Visit: Payer: Medicare Other | Attending: Cardiology

## 2022-09-04 ENCOUNTER — Encounter: Payer: Self-pay | Admitting: Cardiology

## 2022-09-04 VITALS — BP 120/72 | HR 65 | Ht 68.0 in | Wt 174.0 lb

## 2022-09-04 DIAGNOSIS — J309 Allergic rhinitis, unspecified: Secondary | ICD-10-CM | POA: Insufficient documentation

## 2022-09-04 DIAGNOSIS — R0609 Other forms of dyspnea: Secondary | ICD-10-CM | POA: Diagnosis not present

## 2022-09-04 DIAGNOSIS — E118 Type 2 diabetes mellitus with unspecified complications: Secondary | ICD-10-CM | POA: Insufficient documentation

## 2022-09-04 DIAGNOSIS — R55 Syncope and collapse: Secondary | ICD-10-CM

## 2022-09-04 DIAGNOSIS — I951 Orthostatic hypotension: Secondary | ICD-10-CM

## 2022-09-04 DIAGNOSIS — E782 Mixed hyperlipidemia: Secondary | ICD-10-CM

## 2022-09-04 DIAGNOSIS — E1140 Type 2 diabetes mellitus with diabetic neuropathy, unspecified: Secondary | ICD-10-CM

## 2022-09-04 DIAGNOSIS — H903 Sensorineural hearing loss, bilateral: Secondary | ICD-10-CM | POA: Insufficient documentation

## 2022-09-04 DIAGNOSIS — Z8249 Family history of ischemic heart disease and other diseases of the circulatory system: Secondary | ICD-10-CM

## 2022-09-04 DIAGNOSIS — I10 Essential (primary) hypertension: Secondary | ICD-10-CM

## 2022-09-04 DIAGNOSIS — H1710 Central corneal opacity, unspecified eye: Secondary | ICD-10-CM | POA: Insufficient documentation

## 2022-09-04 DIAGNOSIS — Z461 Encounter for fitting and adjustment of hearing aid: Secondary | ICD-10-CM | POA: Insufficient documentation

## 2022-09-04 MED ORDER — AMLODIPINE BESYLATE 5 MG PO TABS: 5.0000 mg | ORAL_TABLET | Freq: Every day | ORAL | 3 refills | Status: AC

## 2022-09-04 MED ORDER — AMLODIPINE BESYLATE 5 MG PO TABS: 5.0000 mg | ORAL_TABLET | Freq: Every day | ORAL | 3 refills | Status: DC

## 2022-09-04 NOTE — Addendum Note (Signed)
Addended by: Baldo Ash D on: 09/04/2022 11:05 AM   Modules accepted: Orders

## 2022-09-04 NOTE — Progress Notes (Signed)
Cardiology Consultation:    Date:  09/04/2022   ID:  Lance Hill, DOB 05/04/1945, MRN [id number]  PCP:  Buckner Malta, MD  Cardiologist:  Gypsy Balsam, MD   Referring MD: Raye Sorrow, DO   Chief Complaint  Patient presents with   Dizziness    Ongoing for 6 months    History of Present Illness:    Lance Hill is a 77 y.o. male who is being seen today for the evaluation of dizziness at the request of Piva, Enrico E, DO.  Past medical history significant for longstanding diabetes, essential hypertension, dyslipidemia.  He was referred to [place] because of episode of dizziness.  He describes dizziness when he getting up and he does have orthostatic changes even today in my office he is laying down blood pressure was 120/72 upon standing his blood pressure was 90/64.  He also described to have some what appears to be vertigo is being managed with physical therapy with good response and that is getting much better fairly typical vertigo turning head will make room spinning also laying down in the bed going from side-to-side will make dizziness as well.  He describes 1 episode however when he was blowing leaves he was walking with his blower he was trying to cross the [facility] and next and he knew he was on the floor ground rather in he does not know why he passed out.  Denies have any chest pain tightness squeezing pressure burning chest no shortness of breath.  He likes to play golf however use cart while playing golf.  Never had any heart trouble no myocardial infarction no congestive heart failure.  Never smoked does not do any diet but he is trying to exercise on the regular basis  Past Medical History:  Diagnosis Date   Arthritis    Combined form of senile cataract 07/22/2013   Formatting of this note might be different from the original. OD>OS   Corneal epithelial defect 10/09/2013   Corneal laceration 07/22/2013   Diabetes mellitus without complication (HCC)    type II    Enlarged  prostate 08/23/2020   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   Failed total hip arthroplasty, sequela 06/26/2017   Failed total hip arthroplasty, subsequent encounter 06/26/2017   GERD (gastroesophageal reflux disease)    Glaucoma 08/23/2020   History of kidney stones    hx of    Hyperlipidemia 08/23/2020   Hypertension    Irregular astigmatism 07/22/2013   Lumbar radicular pain 02/15/2016   Multilevel degenerative disc disease 08/23/2020   Ocular trauma 07/22/2013   Spinal stenosis of lumbar region 02/06/2017   Spinal stenosis, lumbar 02/06/2017   Status post cataract extraction and insertion of intraocular lens, right 10/09/2013   Trochanteric bursitis of right hip 02/17/2019   Type 2 diabetes mellitus with diabetic neuropathy, unspecified (HCC) 08/23/2020   Type II diabetes mellitus, well controlled (HCC) 02/17/2019    Past Surgical History:  Procedure Laterality Date   CHOLECYSTECTOMY     EYE SURGERY     right eye surgery , bilateral cataract    JOINT REPLACEMENT     hip and knee replacements    LUMBAR LAMINECTOMY/DECOMPRESSION MICRODISCECTOMY Right 02/06/2017   Procedure: Microlumbar decompression L5-S1 right;  Surgeon: Jene Every, MD;  Location: [facility];  Service: Orthopedics;  Laterality: Right;  90 mins   TONSILLECTOMY     TOTAL HIP REVISION Right 06/26/2017   Procedure: Right hip femoral head revision and abductor tendon repair;  Surgeon: Ollen Gross, MD;  Location: [facility];  Service: Orthopedics;  Laterality: Right;   TOTAL HIP REVISION Right 08/25/2018   Procedure: Right acetabular revision;  Surgeon: Ollen Gross, MD;  Location: [facility];  Service: Orthopedics;  Laterality: Right;  to follow    Current Medications: Current Meds  Medication Sig   acetaminophen (TYLENOL) 325 MG tablet Take 650 mg by mouth every 6 (six) hours as needed for moderate pain.   amLODipine (NORVASC) 10 MG tablet Take 10 mg by mouth at bedtime.     ARTIFICIAL TEAR OP Place 1 drop into both eyes daily as needed (for dry eyes).   Brinzolamide-Brimonidine 1-0.2 % SUSP Place 1 drop into both eyes 3 (three) times daily. SIMBRINZA   Cholecalciferol ([employer] VITAMIN D3) 1000 units capsule Take 2,000 Units by mouth daily.   gabapentin (NEURONTIN) 300 MG capsule Take 600 mg by mouth 2 (two) times daily.    glipiZIDE (GLUCOTROL) 5 MG tablet Take 2.5 mg by mouth daily before breakfast.   glyBURIDE (DIABETA) 5 MG tablet Take 5 mg by mouth 2 (two) times daily with a meal.   hydrochlorothiazide (HYDRODIURIL) 25 MG tablet Take 12.5 mg by mouth daily.   loratadine (CLARITIN) 10 MG tablet Take 10 mg by mouth daily.   losartan (COZAAR) 100 MG tablet Take 100 mg by mouth daily.   magnesium oxide (MAG-OX) 400 MG tablet Take 400 mg by mouth daily.   Menthol, Topical Analgesic, (BIOFREEZE) 4 % GEL Apply 1 application topically daily as needed (pain).   metFORMIN (GLUCOPHAGE) 500 MG tablet Take 500 mg by mouth 2 (two) times daily with a meal.    mirabegron ER (MYRBETRIQ) 25 MG TB24 tablet Take 25 mg by mouth daily.   Multiple Vitamin (MULTIVITAMIN WITH MINERALS) TABS tablet Take 1 tablet by mouth daily.   Omega-3 Fatty Acids (FISH OIL) 1200 MG CAPS Take 1,200 mg by mouth at bedtime.   OVER THE COUNTER MEDICATION Place 1 Dose under the tongue daily. Hemp Flower Extract 500 mg drops. 1/2 dropperful daily   pioglitazone (ACTOS) 45 MG tablet Take 45 mg by mouth daily.   propranolol (INDERAL) 10 MG tablet Take 10 mg by mouth daily.   ranitidine (ZANTAC) 300 MG tablet Take 300 mg by mouth 2 (two) times daily.    simvastatin (ZOCOR) 20 MG tablet Take 20 mg by mouth at bedtime.    traMADol (ULTRAM) 50 MG tablet Take 1-2 tablets (50-100 mg total) by mouth every 6 (six) hours as needed for moderate pain (If Vicodin alone ineffective).   trolamine salicylate (ASPERCREME) 10 % cream Apply 1 application topically as needed for muscle pain.   [DISCONTINUED]  HYDROcodone-acetaminophen (NORCO/VICODIN) 5-325 MG tablet Take 1-2 tablets by mouth every 6 (six) hours as needed for severe pain.   [DISCONTINUED] methocarbamol (ROBAXIN) 500 MG tablet Take 1 tablet (500 mg total) by mouth every 6 (six) hours as needed for muscle spasms.     Allergies:   Lisinopril   Social History   Socioeconomic History   Marital status: Married    Spouse name: Not on file   Number of children: Not on file   Years of education: Not on file   Highest education level: Not on file  Occupational History   Not on file  Tobacco Use   Smoking status: Never   Smokeless tobacco: Never  Vaping Use   Vaping Use: Never used  Substance and Sexual Activity   Alcohol use: Yes    Comment:  glass wine once a month   Drug use: No   Sexual activity: Not on file  Other Topics Concern   Not on file  Social History Narrative   Not on file   Social Determinants of Health   Financial Resource Strain: Not on file  Food Insecurity: Not on file  Transportation Needs: Not on file  Physical Activity: Not on file  Stress: Not on file  Social Connections: Not on file     Family History: The patient's family history is not on file. ROS:   Please see the history of present illness.    All 14 point review of systems negative except as described per history of present illness.  EKGs/Labs/Other Studies Reviewed:    The following studies were reviewed today:   EKG:  EKG is  ordered today.  The ekg ordered today demonstrates EKG showed normal sinus rhythm, normal P interval nonspecific ST segment changes  Recent Labs: No results found for requested labs within last 365 days.  Recent Lipid Panel No results found for: "CHOL", "TRIG", "HDL", "CHOLHDL", "VLDL", "LDLCALC", "LDLDIRECT"  Physical Exam:    VS:  BP 120/72 (BP Location: Left Arm, Patient Position: Sitting)   Pulse 65   Ht 5\' 8"  (1.727 m)   Wt 174 lb (78.9 kg)   SpO2 97%   BMI 26.46 kg/m     Wt Readings from  Last 3 Encounters:  09/04/22 174 lb (78.9 kg)  08/25/18 197 lb 4 oz (89.5 kg)  08/21/18 197 lb 4 oz (89.5 kg)     GEN:  Well nourished, well developed in no acute distress HEENT: Normal NECK: No JVD; No carotid bruits LYMPHATICS: No lymphadenopathy CARDIAC: RRR, no murmurs, no rubs, no gallops RESPIRATORY:  Clear to auscultation without rales, wheezing or rhonchi  ABDOMEN: Soft, non-tender, non-distended MUSCULOSKELETAL:  No edema; No deformity  SKIN: Warm and dry NEUROLOGIC:  Alert and oriented x 3 PSYCHIATRIC:  Normal affect   ASSESSMENT:    1. Orthostatic hypotension   2. Essential hypertension   3. Type 2 diabetes mellitus with diabetic neuropathy, without long-term current use of insulin (HCC)   4. Mixed hyperlipidemia    PLAN:    In order of problems listed above:  Orthostatic hypotension clearly demonstrated.  I will cut down his amlodipine to only 5 mg daily, ask him to stay well-hydrated with discussed step up approach to this problem which may include also elastic stockings in the future on discontinuation some of his additional medications.  I think the reason for his orthostatic hypotension is diabetic neuropathy.  I explained to him the mechanism of this and ask him to get up very carefully and very gently.  And sit down when he gets dizzy upon getting up.  Part of evaluation echocardiogram to be done to make sure his left ventricle ejection fraction is normal.  1 episodes of dizziness and passing out make me worry.  I will put monitor to make sure he does not have any arrhythmia. Essential hypertension seems to be well-controlled continue present management. Type 2 diabetes that being followed by internal medicine team.  I do have his hemoglobin A1c which is 7.5 from last year.  Obviously need to be well-controlled which will prevent him from getting dehydrated. Mixed dyslipidemia I do have cholesterol from February of last year total cholesterol 155 HDL 38.  I do not  have LDL.  He is taking simvastatin 20 which is low intensity statin. We did talk in length  about his risk factors for coronary artery disease he does have any typical symptomatology however he is diabetic I think there is some value in getting his calcium score based on that we will decide how aggressive we need to be treated management and also if we need to do more ischemia workup.   Medication Adjustments/Labs and Tests Ordered: Current medicines are reviewed at length with the patient today.  Concerns regarding medicines are outlined above.  No orders of the defined types were placed in this encounter.  No orders of the defined types were placed in this encounter.   Signed, Georgeanna Lea, MD, [facility]. 09/04/2022 10:25 AM    [facility]

## 2022-09-04 NOTE — Addendum Note (Signed)
Addended by: Baldo Ash D on: 09/04/2022 10:51 AM   Modules accepted: Orders

## 2022-09-04 NOTE — Patient Instructions (Signed)
Medication Instructions:   DECREASE: Amlodipine to 5mg  daily-you may 1/2 your current dose and your next refill will reflect your new dose.   Lab Work: None Ordered If you have labs (blood work) drawn today and your tests are completely normal, you will receive your results only by: MyChart Message (if you have MyChart) OR A paper copy in the mail If you have any lab test that is abnormal or we need to change your treatment, we will call you to review the results.   Testing/Procedures: We will order CT coronary calcium score. It will cost $99.00 and is not covered by insurance.  Please call to schedule.    [facility] [address] [place], [place] [address] [phone]    Your physician has requested that you have an echocardiogram. Echocardiography is a painless test that uses sound waves to create images of your heart. It provides your doctor with information about the size and shape of your heart and how well your heart's chambers and valves are working. This procedure takes approximately one hour. There are no restrictions for this procedure. Please do NOT wear cologne, perfume, aftershave, or lotions (deodorant is allowed). Please arrive 15 minutes prior to your appointment time.    WHY IS MY DOCTOR PRESCRIBING ZIO? The Zio system is proven and trusted by physicians to detect and diagnose irregular heart rhythms -- and has been prescribed to hundreds of thousands of patients.  The FDA has cleared the Zio system to monitor for many different kinds of irregular heart rhythms. In a study, physicians were able to reach a diagnosis 90% of the time with the Zio system1.  You can wear the Zio monitor -- a small, discreet, comfortable patch -- during your normal day-to-day activity, including while you sleep, shower, and exercise, while it records every single heartbeat for analysis.  1Barrett, P., et al. Comparison of 24 Hour Holter Monitoring Versus 14 Day Novel  Adhesive Patch Electrocardiographic Monitoring. American Journal of Medicine, 2014.  ZIO VS. HOLTER MONITORING The Zio monitor can be comfortably worn for up to 14 days. Holter monitors can be worn for 24 to 48 hours, limiting the time to record any irregular heart rhythms you may have. Zio is able to capture data for the 51% of patients who have their first symptom-triggered arrhythmia after 48 hours.1  LIVE WITHOUT RESTRICTIONS The Zio ambulatory cardiac monitor is a small, unobtrusive, and water-resistant patch--you might even forget you're wearing it. The Zio monitor records and stores every beat of your heart, whether you're sleeping, working out, or showering.      Follow-Up: At [facility], you and your health needs are our priority.  As part of our continuing mission to provide you with exceptional heart care, we have created designated Provider Care Teams.  These Care Teams include your primary Cardiologist (physician) and Advanced Practice Providers (APPs -  Physician Assistants and Nurse Practitioners) who all work together to provide you with the care you need, when you need it.  We recommend signing up for the patient portal called "MyChart".  Sign up information is provided on this After Visit Summary.  MyChart is used to connect with patients for Virtual Visits (Telemedicine).  Patients are able to view lab/test results, encounter notes, upcoming appointments, etc.  Non-urgent messages can be sent to your provider as well.   To learn more about what you can do with MyChart, go to [email or web address].    Your next appointment:   2  month(s)  The format for your next appointment:   In Person  Provider:   Gypsy Balsam, MD    Other Instructions NA

## 2022-09-07 DIAGNOSIS — N3941 Urge incontinence: Secondary | ICD-10-CM | POA: Diagnosis not present

## 2022-09-07 DIAGNOSIS — N430 Encysted hydrocele: Secondary | ICD-10-CM | POA: Diagnosis not present

## 2022-09-10 DIAGNOSIS — D649 Anemia, unspecified: Secondary | ICD-10-CM | POA: Diagnosis not present

## 2022-09-20 ENCOUNTER — Ambulatory Visit (HOSPITAL_BASED_OUTPATIENT_CLINIC_OR_DEPARTMENT_OTHER): Payer: Medicare Other | Attending: Cardiology

## 2022-09-20 DIAGNOSIS — R0609 Other forms of dyspnea: Secondary | ICD-10-CM

## 2022-09-20 LAB — ECHOCARDIOGRAM COMPLETE: S' Lateral: 2.6 cm

## 2022-09-21 ENCOUNTER — Ambulatory Visit (HOSPITAL_BASED_OUTPATIENT_CLINIC_OR_DEPARTMENT_OTHER)
Admission: RE | Admit: 2022-09-21 | Discharge: 2022-09-21 | Disposition: A | Discharge status: Home / Self Care | Payer: Self-pay | Source: Ambulatory Visit | Attending: Cardiology | Admitting: Cardiology

## 2022-09-21 DIAGNOSIS — R0609 Other forms of dyspnea: Secondary | ICD-10-CM | POA: Insufficient documentation

## 2022-09-21 DIAGNOSIS — Z8249 Family history of ischemic heart disease and other diseases of the circulatory system: Secondary | ICD-10-CM | POA: Insufficient documentation

## 2022-09-26 DIAGNOSIS — I951 Orthostatic hypotension: Secondary | ICD-10-CM | POA: Diagnosis not present

## 2022-09-26 DIAGNOSIS — R55 Syncope and collapse: Secondary | ICD-10-CM | POA: Diagnosis not present

## 2022-09-28 ENCOUNTER — Telehealth: Payer: Self-pay

## 2022-09-28 DIAGNOSIS — E782 Mixed hyperlipidemia: Secondary | ICD-10-CM

## 2022-09-28 MED ORDER — ATORVASTATIN CALCIUM 10 MG PO TABS: 10.0000 mg | ORAL_TABLET | Freq: Every day | ORAL | 3 refills | Status: DC

## 2022-09-28 NOTE — Telephone Encounter (Signed)
Spoke with pt regarding cardiac CT score results. He agreed to start Lipitor 10mg  1 daily and have Lipid panel, AST, ALT in 6 weeks.

## 2022-09-28 NOTE — Telephone Encounter (Signed)
Results reviewed with pt as per Dr. Krasowski's note.  Pt verbalized understanding and had no additional questions. Routed to PCP  

## 2022-10-01 ENCOUNTER — Telehealth: Payer: Self-pay | Admitting: Cardiology

## 2022-10-01 NOTE — Telephone Encounter (Signed)
Pt c/o medication issue:  1. Name of Medication: simvastatin (ZOCOR) 20 MG tablet   2. How are you currently taking this medication (dosage and times per day)?    3. Are you having a reaction (difficulty breathing--STAT)? no  4. What is your medication issue? Wife calling to see if the dr took the patient off medication or if he still suppose to be taking it. Please advise

## 2022-10-01 NOTE — Telephone Encounter (Addendum)
Left message for spouse- per DPR. Per Dr. Bing Matter after Calcium score he is starting Lipitor 10mg  in place of Zocor.

## 2022-10-11 ENCOUNTER — Telehealth: Payer: Self-pay

## 2022-10-11 NOTE — Telephone Encounter (Signed)
Unable to reach or LM, Mailed a letter requesting a call back 

## 2022-10-11 NOTE — Telephone Encounter (Signed)
-----   Message from Georgeanna Lea, MD sent at 10/10/2022  1:14 PM EDT ----- Monitor showed multiple episode of supraventricular tachycardia, please start metoprolol 12.5 twice daily

## 2022-10-12 DIAGNOSIS — L570 Actinic keratosis: Secondary | ICD-10-CM | POA: Diagnosis not present

## 2022-10-12 DIAGNOSIS — C44222 Squamous cell carcinoma of skin of right ear and external auricular canal: Secondary | ICD-10-CM | POA: Diagnosis not present

## 2022-10-12 DIAGNOSIS — L814 Other melanin hyperpigmentation: Secondary | ICD-10-CM | POA: Diagnosis not present

## 2022-10-15 DIAGNOSIS — R350 Frequency of micturition: Secondary | ICD-10-CM | POA: Diagnosis not present

## 2022-10-15 DIAGNOSIS — N1831 Chronic kidney disease, stage 3a: Secondary | ICD-10-CM | POA: Diagnosis not present

## 2022-10-15 DIAGNOSIS — E785 Hyperlipidemia, unspecified: Secondary | ICD-10-CM | POA: Diagnosis not present

## 2022-10-15 DIAGNOSIS — D649 Anemia, unspecified: Secondary | ICD-10-CM | POA: Diagnosis not present

## 2022-10-15 DIAGNOSIS — E1169 Type 2 diabetes mellitus with other specified complication: Secondary | ICD-10-CM | POA: Diagnosis not present

## 2022-10-31 DIAGNOSIS — Z96641 Presence of right artificial hip joint: Secondary | ICD-10-CM | POA: Diagnosis not present

## 2022-11-07 DIAGNOSIS — C44212 Basal cell carcinoma of skin of right ear and external auricular canal: Secondary | ICD-10-CM | POA: Diagnosis not present

## 2022-11-16 ENCOUNTER — Ambulatory Visit: Payer: Medicare Other | Admitting: Cardiology

## 2022-12-05 DIAGNOSIS — E1169 Type 2 diabetes mellitus with other specified complication: Secondary | ICD-10-CM | POA: Diagnosis not present

## 2022-12-05 DIAGNOSIS — R350 Frequency of micturition: Secondary | ICD-10-CM | POA: Diagnosis not present

## 2022-12-05 DIAGNOSIS — N1831 Chronic kidney disease, stage 3a: Secondary | ICD-10-CM | POA: Diagnosis not present

## 2022-12-05 DIAGNOSIS — E785 Hyperlipidemia, unspecified: Secondary | ICD-10-CM | POA: Diagnosis not present

## 2022-12-05 DIAGNOSIS — E1165 Type 2 diabetes mellitus with hyperglycemia: Secondary | ICD-10-CM | POA: Diagnosis not present

## 2022-12-10 DIAGNOSIS — M1712 Unilateral primary osteoarthritis, left knee: Secondary | ICD-10-CM | POA: Diagnosis not present

## 2022-12-10 DIAGNOSIS — M25522 Pain in left elbow: Secondary | ICD-10-CM | POA: Diagnosis not present

## 2022-12-10 DIAGNOSIS — M6281 Muscle weakness (generalized): Secondary | ICD-10-CM | POA: Diagnosis not present

## 2022-12-10 DIAGNOSIS — M19022 Primary osteoarthritis, left elbow: Secondary | ICD-10-CM | POA: Diagnosis not present

## 2022-12-13 DIAGNOSIS — M1712 Unilateral primary osteoarthritis, left knee: Secondary | ICD-10-CM | POA: Diagnosis not present

## 2022-12-13 DIAGNOSIS — M25522 Pain in left elbow: Secondary | ICD-10-CM | POA: Diagnosis not present

## 2022-12-13 DIAGNOSIS — M6281 Muscle weakness (generalized): Secondary | ICD-10-CM | POA: Diagnosis not present

## 2022-12-13 DIAGNOSIS — M19022 Primary osteoarthritis, left elbow: Secondary | ICD-10-CM | POA: Diagnosis not present

## 2022-12-17 DIAGNOSIS — M1712 Unilateral primary osteoarthritis, left knee: Secondary | ICD-10-CM | POA: Diagnosis not present

## 2022-12-17 DIAGNOSIS — M25522 Pain in left elbow: Secondary | ICD-10-CM | POA: Diagnosis not present

## 2022-12-17 DIAGNOSIS — M6281 Muscle weakness (generalized): Secondary | ICD-10-CM | POA: Diagnosis not present

## 2022-12-17 DIAGNOSIS — M19022 Primary osteoarthritis, left elbow: Secondary | ICD-10-CM | POA: Diagnosis not present

## 2022-12-20 DIAGNOSIS — M1712 Unilateral primary osteoarthritis, left knee: Secondary | ICD-10-CM | POA: Diagnosis not present

## 2022-12-20 DIAGNOSIS — M25522 Pain in left elbow: Secondary | ICD-10-CM | POA: Diagnosis not present

## 2022-12-20 DIAGNOSIS — M6281 Muscle weakness (generalized): Secondary | ICD-10-CM | POA: Diagnosis not present

## 2022-12-20 DIAGNOSIS — M19022 Primary osteoarthritis, left elbow: Secondary | ICD-10-CM | POA: Diagnosis not present

## 2022-12-24 DIAGNOSIS — M19022 Primary osteoarthritis, left elbow: Secondary | ICD-10-CM | POA: Diagnosis not present

## 2022-12-24 DIAGNOSIS — M1712 Unilateral primary osteoarthritis, left knee: Secondary | ICD-10-CM | POA: Diagnosis not present

## 2022-12-24 DIAGNOSIS — M6281 Muscle weakness (generalized): Secondary | ICD-10-CM | POA: Diagnosis not present

## 2022-12-24 DIAGNOSIS — M25522 Pain in left elbow: Secondary | ICD-10-CM | POA: Diagnosis not present

## 2022-12-26 DIAGNOSIS — E782 Mixed hyperlipidemia: Secondary | ICD-10-CM | POA: Diagnosis not present

## 2022-12-27 ENCOUNTER — Encounter: Payer: Self-pay | Admitting: Cardiology

## 2022-12-27 ENCOUNTER — Ambulatory Visit: Payer: Medicare Other | Attending: Cardiology | Admitting: Cardiology

## 2022-12-27 VITALS — BP 130/70 | HR 69 | Ht 68.0 in | Wt 181.0 lb

## 2022-12-27 DIAGNOSIS — M25522 Pain in left elbow: Secondary | ICD-10-CM | POA: Diagnosis not present

## 2022-12-27 DIAGNOSIS — I951 Orthostatic hypotension: Secondary | ICD-10-CM | POA: Diagnosis not present

## 2022-12-27 DIAGNOSIS — M1712 Unilateral primary osteoarthritis, left knee: Secondary | ICD-10-CM | POA: Diagnosis not present

## 2022-12-27 DIAGNOSIS — R931 Abnormal findings on diagnostic imaging of heart and coronary circulation: Secondary | ICD-10-CM | POA: Diagnosis not present

## 2022-12-27 DIAGNOSIS — E1140 Type 2 diabetes mellitus with diabetic neuropathy, unspecified: Secondary | ICD-10-CM | POA: Insufficient documentation

## 2022-12-27 DIAGNOSIS — R9430 Abnormal result of cardiovascular function study, unspecified: Secondary | ICD-10-CM | POA: Diagnosis not present

## 2022-12-27 DIAGNOSIS — M19022 Primary osteoarthritis, left elbow: Secondary | ICD-10-CM | POA: Diagnosis not present

## 2022-12-27 DIAGNOSIS — I10 Essential (primary) hypertension: Secondary | ICD-10-CM | POA: Insufficient documentation

## 2022-12-27 DIAGNOSIS — M6281 Muscle weakness (generalized): Secondary | ICD-10-CM | POA: Diagnosis not present

## 2022-12-27 NOTE — Progress Notes (Signed)
Cardiology Office Note:    Date:  12/27/2022   ID:  Lance Hill, DOB 02/19/1946, MRN [id number]  PCP:  Buckner Malta, MD  Cardiologist:  Gypsy Balsam, MD    Referring MD: Buckner Malta, MD   Chief Complaint  Patient presents with   Follow-up    History of Present Illness:    Lance Hill is a 77 y.o. male past medical history significant for longstanding diabetes, essential hypertension, dyslipidemia.  He was presented to [place] for dizziness.  On initial encounter he was found to have significant orthostatic hypotension, the dose of low amlodipine has been reduced since that time he is doing well.  As a part evaluation echocardiogram being performed which showed preserved left ventricle ejection fraction, we also did a calcium score which was very elevated 2200.  He is coming to my office to talk about this.  Denies have any chest pain tightness squeezing pressure mid chest described to have shortness of breath quite easily especially when he is walking uphill.  Past Medical History:  Diagnosis Date   Arthritis    Combined form of senile cataract 07/22/2013   Formatting of this note might be different from the original. 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   Failed total hip arthroplasty, sequela 06/26/2017   Failed total hip arthroplasty, subsequent encounter 06/26/2017   GERD (gastroesophageal reflux disease)    Glaucoma 08/23/2020   History of kidney stones    hx of    Hyperlipidemia 08/23/2020   Hypertension    Irregular astigmatism 07/22/2013   Lumbar radicular pain 02/15/2016   Multilevel degenerative disc disease 08/23/2020   Ocular trauma 07/22/2013   Spinal stenosis of lumbar region 02/06/2017   Spinal stenosis, lumbar 02/06/2017   Status post cataract extraction  and insertion of intraocular lens, right 10/09/2013   Trochanteric bursitis of right hip 02/17/2019   Type 2 diabetes mellitus with diabetic neuropathy, unspecified (HCC) 08/23/2020   Type II diabetes mellitus, well controlled (HCC) 02/17/2019    Past Surgical History:  Procedure Laterality Date   CHOLECYSTECTOMY     EYE SURGERY     right eye surgery , bilateral cataract    JOINT REPLACEMENT     hip and knee replacements    LUMBAR LAMINECTOMY/DECOMPRESSION MICRODISCECTOMY Right 02/06/2017   Procedure: Microlumbar decompression L5-S1 right;  Surgeon: Jene Every, MD;  Location: [facility];  Service: Orthopedics;  Laterality: Right;  90 mins   TONSILLECTOMY     TOTAL HIP REVISION Right 06/26/2017   Procedure: Right hip femoral head revision and abductor tendon repair;  Surgeon: Ollen Gross, MD;  Location: [facility];  Service: Orthopedics;  Laterality: Right;   TOTAL HIP REVISION Right 08/25/2018   Procedure: Right acetabular revision;  Surgeon: Ollen Gross, MD;  Location: [facility];  Service: Orthopedics;  Laterality: Right;  to follow    Current Medications: Current Meds  Medication Sig   acetaminophen (TYLENOL) 325 MG tablet Take 650 mg by mouth every 6 (six) hours as needed for moderate pain.   amLODipine (NORVASC) 5 MG tablet Take 1 tablet (5 mg total) by mouth daily.   ARTIFICIAL TEAR OP Place 1 drop into both eyes daily as needed (for dry eyes).   atorvastatin (LIPITOR) 10 MG tablet Take 1 tablet (10 mg total) by mouth daily.  Brinzolamide-Brimonidine 1-0.2 % SUSP Place 1 drop into both eyes 3 (three) times daily. SIMBRINZA   Cholecalciferol ([employer] VITAMIN D3) 1000 units capsule Take 2,000 Units by mouth daily.   dapagliflozin propanediol (FARXIGA) 5 MG TABS tablet Take 5 mg by mouth daily.   gabapentin (NEURONTIN) 300 MG capsule Take 600 mg by mouth 2 (two) times daily.    glipiZIDE (GLUCOTROL) 5 MG tablet Take 2.5 mg by mouth daily before breakfast.   loratadine  (CLARITIN) 10 MG tablet Take 10 mg by mouth daily.   losartan (COZAAR) 100 MG tablet Take 100 mg by mouth daily.   Menthol, Topical Analgesic, (BIOFREEZE) 4 % GEL Apply 1 application topically daily as needed (pain).   metFORMIN (GLUCOPHAGE) 500 MG tablet Take 500 mg by mouth 2 (two) times daily with a meal.    mirabegron ER (MYRBETRIQ) 25 MG TB24 tablet Take 25 mg by mouth daily.   Omega-3 Fatty Acids (FISH OIL) 1200 MG CAPS Take 1,200 mg by mouth at bedtime.   OVER THE COUNTER MEDICATION Place 1 Dose under the tongue daily. Hemp Flower Extract 500 mg drops. 1/2 dropperful daily   pioglitazone (ACTOS) 45 MG tablet Take 45 mg by mouth daily.   propranolol (INDERAL) 10 MG tablet Take 10 mg by mouth daily.   ranitidine (ZANTAC) 300 MG tablet Take 300 mg by mouth 2 (two) times daily.    traMADol (ULTRAM) 50 MG tablet Take 1-2 tablets (50-100 mg total) by mouth every 6 (six) hours as needed for moderate pain (If Vicodin alone ineffective).   trolamine salicylate (ASPERCREME) 10 % cream Apply 1 application topically as needed for muscle pain.   [DISCONTINUED] glyBURIDE (DIABETA) 5 MG tablet Take 5 mg by mouth 2 (two) times daily with a meal.   [DISCONTINUED] hydrochlorothiazide (HYDRODIURIL) 25 MG tablet Take 12.5 mg by mouth daily.   [DISCONTINUED] magnesium oxide (MAG-OX) 400 MG tablet Take 400 mg by mouth daily.   [DISCONTINUED] Multiple Vitamin (MULTIVITAMIN WITH MINERALS) TABS tablet Take 1 tablet by mouth daily.     Allergies:   Lisinopril   Social History   Socioeconomic History   Marital status: Married    Spouse name: Not on file   Number of children: Not on file   Years of education: Not on file   Highest education level: Not on file  Occupational History   Not on file  Tobacco Use   Smoking status: Never   Smokeless tobacco: Never  Vaping Use   Vaping status: Never Used  Substance and Sexual Activity   Alcohol use: Yes    Comment: glass wine once a month   Drug use: No    Sexual activity: Not on file  Other Topics Concern   Not on file  Social History Narrative   Not on file   Social Determinants of Health   Financial Resource Strain: Not on file  Food Insecurity: Not on file  Transportation Needs: Not on file  Physical Activity: Not on file  Stress: Not on file  Social Connections: Not on file     Family History: The patient's family history is not on file. ROS:   Please see the history of present illness.    All 14 point review of systems negative except as described per history of present illness  EKGs/Labs/Other Studies Reviewed:         Recent Labs: 12/26/2022: ALT 20  Recent Lipid Panel    Component Value Date/Time   CHOL 149 12/26/2022 0849  TRIG 91 12/26/2022 0849   HDL 45 12/26/2022 0849   CHOLHDL 3.3 12/26/2022 0849   LDLCALC 87 12/26/2022 0849    Physical Exam:    VS:  BP 130/70 (BP Location: Left Arm, Patient Position: Sitting)   Pulse 69   Ht 5\' 8"  (1.727 m)   Wt 181 lb (82.1 kg)   SpO2 92%   BMI 27.52 kg/m     Wt Readings from Last 3 Encounters:  12/27/22 181 lb (82.1 kg)  09/04/22 174 lb (78.9 kg)  08/25/18 197 lb 4 oz (89.5 kg)     GEN:  Well nourished, well developed in no acute distress HEENT: Normal NECK: No JVD; No carotid bruits LYMPHATICS: No lymphadenopathy CARDIAC: RRR, no murmurs, no rubs, no gallops RESPIRATORY:  Clear to auscultation without rales, wheezing or rhonchi  ABDOMEN: Soft, non-tender, non-distended MUSCULOSKELETAL:  No edema; No deformity  SKIN: Warm and dry LOWER EXTREMITIES: no swelling NEUROLOGIC:  Alert and oriented x 3 PSYCHIATRIC:  Normal affect   ASSESSMENT:    1. Orthostatic hypotension   2. Essential hypertension   3. Type 2 diabetes mellitus with diabetic neuropathy, without long-term current use of insulin (HCC)   4. Elevated coronary artery calcium score more than 2000    PLAN:    In order of problems listed above:  Orthostatic hypotension seems to be doing  well right now after reducing the dose of amlodipine.  I asked him to make sure that he is well-hydrated.  He likes to play golf and he played golf always on Tuesday sometimes when weather is very hot at the time when he put his head down and get up he will get dizzy but not to the point of passing out I told him to drink plenty of fluid. Elevated calcium score we will do stress test make sure he does not have an obstructive disease, he does not have any symptoms that would suggest that except for some shortness of breath but he is diabetic at high risk of having a problem with sometimes without significant symptoms. Type 2 diabetes poorly controlled last hemoglobin A1c is 10.5 he notes is a problem try to work on that. Dyslipidemia: Will continue monitoring he is on Lipitor 10 total cholesterol 117 HDL 39 good control. Supraventricular tachycardia discovered on the monitor.  He is completely asymptomatic, free to give him any medication which can aggravate his orthostatic hypotension we will continue monitoring   Medication Adjustments/Labs and Tests Ordered: Current medicines are reviewed at length with the patient today.  Concerns regarding medicines are outlined above.  No orders of the defined types were placed in this encounter.  Medication changes: No orders of the defined types were placed in this encounter.   Signed, Georgeanna Lea, MD, [facility] 12/27/2022 3:05 PM    [facility]

## 2022-12-27 NOTE — Patient Instructions (Addendum)
Medication Instructions:  Your physician recommends that you continue on your current medications as directed. Please refer to the Current Medication list given to you today.  *If you need a refill on your cardiac medications before your next appointment, please call your pharmacy*   Lab Work: None Ordered If you have labs (blood work) drawn today and your tests are completely normal, you will receive your results only by: [place] (if you have MyChart) OR A paper copy in the mail If you have any lab test that is abnormal or we need to change your treatment, we will call you to review the results.   Testing/Procedures: Your physician has requested that you have a lexiscan myoview. For further information please visit [email or web address]. Please follow instruction sheet, as given.  The test will take approximately 3 to 4 hours to complete; you may bring reading material.  If someone comes with you to your appointment, they will need to remain in the main lobby due to limited space in the testing area.  How to prepare for your Myocardial Perfusion Test: Do not eat or drink 3 hours prior to your test, except you may have water. Do not consume products containing caffeine (regular or decaffeinated) 12 hours prior to your test. (ex: coffee, chocolate, sodas, tea). Do bring a list of your current medications with you.  If not listed below, you may take your medications as normal. Do wear comfortable clothes (no dresses or overalls) and walking shoes, tennis shoes preferred (No heels or open toe shoes are allowed). Do NOT wear cologne, perfume, aftershave, or lotions (deodorant is allowed). If these instructions are not followed, your test will have to be rescheduled.      Follow-Up: At [facility], you and your health needs are our priority.  As part of our continuing mission to provide you with exceptional heart care, we have created designated Provider Care Teams.  These Care  Teams include your primary Cardiologist (physician) and Advanced Practice Providers (APPs -  Physician Assistants and Nurse Practitioners) who all work together to provide you with the care you need, when you need it.  We recommend signing up for the patient portal called "MyChart".  Sign up information is provided on this After Visit Summary.  MyChart is used to connect with patients for Virtual Visits (Telemedicine).  Patients are able to view lab/test results, encounter notes, upcoming appointments, etc.  Non-urgent messages can be sent to your provider as well.   To learn more about what you can do with MyChart, go to [email or web address].    Your next appointment:   4 month(s)  The format for your next appointment:   In Person  Provider:   Jenne Campus, MD    Other Instructions NA

## 2022-12-28 ENCOUNTER — Telehealth: Payer: Self-pay

## 2022-12-28 DIAGNOSIS — E782 Mixed hyperlipidemia: Secondary | ICD-10-CM

## 2022-12-28 MED ORDER — ATORVASTATIN CALCIUM 20 MG PO TABS: 20.0000 mg | ORAL_TABLET | Freq: Every day | ORAL | 3 refills | Status: DC

## 2022-12-28 NOTE — Telephone Encounter (Signed)
Spoke with spouse Okey Regal per DPR- per Dr. Vanetta Shawl note. Pt will increase Lipitor to 20mg  and have Lipid panel AST, ALT in 6 weeks. She verbalized understanding and had no further questions.

## 2023-01-01 ENCOUNTER — Telehealth: Payer: Self-pay | Admitting: Cardiology

## 2023-01-01 ENCOUNTER — Telehealth: Payer: Self-pay

## 2023-01-01 NOTE — Telephone Encounter (Signed)
Patient's spouse is returning phone call to go the instructions for the Myocardial Perfusion test. Please advise.

## 2023-01-01 NOTE — Telephone Encounter (Signed)
Will forward this return call back to Milana Na in [facility], to further review and follow-up with the pt about myoview instructions.

## 2023-01-02 DIAGNOSIS — M5416 Radiculopathy, lumbar region: Secondary | ICD-10-CM | POA: Diagnosis not present

## 2023-01-02 DIAGNOSIS — M47816 Spondylosis without myelopathy or radiculopathy, lumbar region: Secondary | ICD-10-CM | POA: Diagnosis not present

## 2023-01-07 DIAGNOSIS — M19022 Primary osteoarthritis, left elbow: Secondary | ICD-10-CM | POA: Diagnosis not present

## 2023-01-07 DIAGNOSIS — M6281 Muscle weakness (generalized): Secondary | ICD-10-CM | POA: Diagnosis not present

## 2023-01-07 DIAGNOSIS — M1712 Unilateral primary osteoarthritis, left knee: Secondary | ICD-10-CM | POA: Diagnosis not present

## 2023-01-07 DIAGNOSIS — M25522 Pain in left elbow: Secondary | ICD-10-CM | POA: Diagnosis not present

## 2023-01-08 ENCOUNTER — Telehealth: Payer: Self-pay

## 2023-01-08 NOTE — Telephone Encounter (Signed)
Detailed instructions left on the patient's answering machine. Asked to call back with any questions. S.Aby Gessel CCT

## 2023-01-09 ENCOUNTER — Ambulatory Visit: Payer: Medicare Other | Attending: Cardiology

## 2023-01-09 DIAGNOSIS — R9430 Abnormal result of cardiovascular function study, unspecified: Secondary | ICD-10-CM | POA: Diagnosis present

## 2023-01-09 LAB — MYOCARDIAL PERFUSION IMAGING
LV dias vol: 78 mL (ref 62–150)
LV sys vol: 26 mL
Nuc Stress EF: 66 %
Peak HR: 78 {beats}/min
Rest HR: 55 {beats}/min
Rest Nuclear Isotope Dose: 10.9 mCi
SDS: 4
SRS: 0
SSS: 4
Stress Nuclear Isotope Dose: 31.7 mCi
TID: 1.11

## 2023-01-09 MED ORDER — REGADENOSON 0.4 MG/5ML IV SOLN
0.4000 mg | Freq: Once | INTRAVENOUS | Status: AC
  Administered 2023-01-09: 0.4 mg via INTRAVENOUS

## 2023-01-09 MED ORDER — TECHNETIUM TC 99M TETROFOSMIN IV KIT
10.9000 | PACK | Freq: Once | INTRAVENOUS | Status: AC | PRN
  Administered 2023-01-09: 10.9 via INTRAVENOUS

## 2023-01-09 MED ORDER — TECHNETIUM TC 99M TETROFOSMIN IV KIT
31.7000 | PACK | Freq: Once | INTRAVENOUS | Status: AC | PRN
  Administered 2023-01-09: 31.7 via INTRAVENOUS

## 2023-01-10 DIAGNOSIS — M25522 Pain in left elbow: Secondary | ICD-10-CM | POA: Diagnosis not present

## 2023-01-10 DIAGNOSIS — M1712 Unilateral primary osteoarthritis, left knee: Secondary | ICD-10-CM | POA: Diagnosis not present

## 2023-01-10 DIAGNOSIS — M19022 Primary osteoarthritis, left elbow: Secondary | ICD-10-CM | POA: Diagnosis not present

## 2023-01-10 DIAGNOSIS — M6281 Muscle weakness (generalized): Secondary | ICD-10-CM | POA: Diagnosis not present

## 2023-01-11 ENCOUNTER — Telehealth: Payer: Self-pay

## 2023-01-11 NOTE — Telephone Encounter (Signed)
-----   Message from Gypsy Balsam sent at 01/11/2023 12:09 PM EDT ----- Stress test was normal

## 2023-01-11 NOTE — Telephone Encounter (Signed)
Patient notified through my chart.

## 2023-01-17 DIAGNOSIS — M6281 Muscle weakness (generalized): Secondary | ICD-10-CM | POA: Diagnosis not present

## 2023-01-17 DIAGNOSIS — M19022 Primary osteoarthritis, left elbow: Secondary | ICD-10-CM | POA: Diagnosis not present

## 2023-01-17 DIAGNOSIS — M25522 Pain in left elbow: Secondary | ICD-10-CM | POA: Diagnosis not present

## 2023-01-17 DIAGNOSIS — M1712 Unilateral primary osteoarthritis, left knee: Secondary | ICD-10-CM | POA: Diagnosis not present

## 2023-02-07 DIAGNOSIS — E782 Mixed hyperlipidemia: Secondary | ICD-10-CM | POA: Diagnosis not present

## 2023-02-08 LAB — LIPID PANEL
Chol/HDL Ratio: 3.8 {ratio} (ref 0.0–5.0)
Cholesterol, Total: 164 mg/dL (ref 100–199)
HDL: 43 mg/dL (ref >39)
LDL Chol Calc (NIH): 97 mg/dL (ref 0–99)
Triglycerides: 138 mg/dL (ref 0–149)
VLDL Cholesterol Cal: 24 mg/dL (ref 5–40)

## 2023-02-08 LAB — ALT: ALT: 23 [IU]/L (ref 0–44)

## 2023-02-08 LAB — AST: AST: 18 [IU]/L (ref 0–40)

## 2023-02-12 DIAGNOSIS — E1140 Type 2 diabetes mellitus with diabetic neuropathy, unspecified: Secondary | ICD-10-CM | POA: Diagnosis not present

## 2023-02-12 DIAGNOSIS — B351 Tinea unguium: Secondary | ICD-10-CM | POA: Diagnosis not present

## 2023-02-20 ENCOUNTER — Telehealth: Payer: Self-pay

## 2023-02-20 ENCOUNTER — Other Ambulatory Visit: Payer: Self-pay

## 2023-02-20 DIAGNOSIS — E782 Mixed hyperlipidemia: Secondary | ICD-10-CM

## 2023-02-20 MED ORDER — ATORVASTATIN CALCIUM 40 MG PO TABS: 40.0000 mg | ORAL_TABLET | Freq: Every day | ORAL | 3 refills | Status: DC

## 2023-02-20 NOTE — Telephone Encounter (Signed)
Results reviewed with pt as per Dr. Krasowski's note.  Pt verbalized understanding and had no additional questions. Routed to PCP  

## 2023-02-20 NOTE — Telephone Encounter (Signed)
-----   Message from Gypsy Balsam sent at 02/18/2023  8:42 AM EDT ----- Cholesterol still not well-controlled, please double the cholesterol medication and fasting lipid profile, AST LT 6 weeks

## 2023-02-20 NOTE — Telephone Encounter (Signed)
Patient is returning call and is requesting call back.  

## 2023-02-20 NOTE — Addendum Note (Signed)
Addended by: Eleonore Chiquito on: 02/20/2023 05:02 PM   Modules accepted: Orders

## 2023-02-20 NOTE — Telephone Encounter (Signed)
LM to return my call. 

## 2023-02-25 DIAGNOSIS — C44212 Basal cell carcinoma of skin of right ear and external auricular canal: Secondary | ICD-10-CM | POA: Diagnosis not present

## 2023-02-25 DIAGNOSIS — L821 Other seborrheic keratosis: Secondary | ICD-10-CM | POA: Diagnosis not present

## 2023-02-25 DIAGNOSIS — R233 Spontaneous ecchymoses: Secondary | ICD-10-CM | POA: Diagnosis not present

## 2023-03-13 DIAGNOSIS — M9905 Segmental and somatic dysfunction of pelvic region: Secondary | ICD-10-CM | POA: Diagnosis not present

## 2023-03-13 DIAGNOSIS — M9902 Segmental and somatic dysfunction of thoracic region: Secondary | ICD-10-CM | POA: Diagnosis not present

## 2023-03-13 DIAGNOSIS — M9903 Segmental and somatic dysfunction of lumbar region: Secondary | ICD-10-CM | POA: Diagnosis not present

## 2023-03-13 DIAGNOSIS — M51362 Other intervertebral disc degeneration, lumbar region with discogenic back pain and lower extremity pain: Secondary | ICD-10-CM | POA: Diagnosis not present

## 2023-04-02 DIAGNOSIS — M51362 Other intervertebral disc degeneration, lumbar region with discogenic back pain and lower extremity pain: Secondary | ICD-10-CM | POA: Diagnosis not present

## 2023-04-02 DIAGNOSIS — M9903 Segmental and somatic dysfunction of lumbar region: Secondary | ICD-10-CM | POA: Diagnosis not present

## 2023-04-02 DIAGNOSIS — M9902 Segmental and somatic dysfunction of thoracic region: Secondary | ICD-10-CM | POA: Diagnosis not present

## 2023-04-02 DIAGNOSIS — M9905 Segmental and somatic dysfunction of pelvic region: Secondary | ICD-10-CM | POA: Diagnosis not present

## 2023-04-03 DIAGNOSIS — M9905 Segmental and somatic dysfunction of pelvic region: Secondary | ICD-10-CM | POA: Diagnosis not present

## 2023-04-03 DIAGNOSIS — M9903 Segmental and somatic dysfunction of lumbar region: Secondary | ICD-10-CM | POA: Diagnosis not present

## 2023-04-03 DIAGNOSIS — M9902 Segmental and somatic dysfunction of thoracic region: Secondary | ICD-10-CM | POA: Diagnosis not present

## 2023-04-03 DIAGNOSIS — M51362 Other intervertebral disc degeneration, lumbar region with discogenic back pain and lower extremity pain: Secondary | ICD-10-CM | POA: Diagnosis not present

## 2023-04-04 DIAGNOSIS — M9902 Segmental and somatic dysfunction of thoracic region: Secondary | ICD-10-CM | POA: Diagnosis not present

## 2023-04-04 DIAGNOSIS — M9903 Segmental and somatic dysfunction of lumbar region: Secondary | ICD-10-CM | POA: Diagnosis not present

## 2023-04-04 DIAGNOSIS — M9905 Segmental and somatic dysfunction of pelvic region: Secondary | ICD-10-CM | POA: Diagnosis not present

## 2023-04-04 DIAGNOSIS — M51362 Other intervertebral disc degeneration, lumbar region with discogenic back pain and lower extremity pain: Secondary | ICD-10-CM | POA: Diagnosis not present

## 2023-04-08 DIAGNOSIS — M9905 Segmental and somatic dysfunction of pelvic region: Secondary | ICD-10-CM | POA: Diagnosis not present

## 2023-04-08 DIAGNOSIS — M9903 Segmental and somatic dysfunction of lumbar region: Secondary | ICD-10-CM | POA: Diagnosis not present

## 2023-04-08 DIAGNOSIS — M51362 Other intervertebral disc degeneration, lumbar region with discogenic back pain and lower extremity pain: Secondary | ICD-10-CM | POA: Diagnosis not present

## 2023-04-08 DIAGNOSIS — M9902 Segmental and somatic dysfunction of thoracic region: Secondary | ICD-10-CM | POA: Diagnosis not present

## 2023-04-10 DIAGNOSIS — M51362 Other intervertebral disc degeneration, lumbar region with discogenic back pain and lower extremity pain: Secondary | ICD-10-CM | POA: Diagnosis not present

## 2023-04-10 DIAGNOSIS — M9905 Segmental and somatic dysfunction of pelvic region: Secondary | ICD-10-CM | POA: Diagnosis not present

## 2023-04-10 DIAGNOSIS — M9903 Segmental and somatic dysfunction of lumbar region: Secondary | ICD-10-CM | POA: Diagnosis not present

## 2023-04-10 DIAGNOSIS — M9902 Segmental and somatic dysfunction of thoracic region: Secondary | ICD-10-CM | POA: Diagnosis not present

## 2023-04-11 DIAGNOSIS — M9903 Segmental and somatic dysfunction of lumbar region: Secondary | ICD-10-CM | POA: Diagnosis not present

## 2023-04-11 DIAGNOSIS — M9902 Segmental and somatic dysfunction of thoracic region: Secondary | ICD-10-CM | POA: Diagnosis not present

## 2023-04-11 DIAGNOSIS — M9905 Segmental and somatic dysfunction of pelvic region: Secondary | ICD-10-CM | POA: Diagnosis not present

## 2023-04-11 DIAGNOSIS — M51362 Other intervertebral disc degeneration, lumbar region with discogenic back pain and lower extremity pain: Secondary | ICD-10-CM | POA: Diagnosis not present

## 2023-04-12 DIAGNOSIS — E782 Mixed hyperlipidemia: Secondary | ICD-10-CM | POA: Diagnosis not present

## 2023-04-13 LAB — LIPID PANEL
Chol/HDL Ratio: 3.1 {ratio} (ref 0.0–5.0)
Cholesterol, Total: 128 mg/dL (ref 100–199)
HDL: 41 mg/dL (ref >39)
LDL Chol Calc (NIH): 68 mg/dL (ref 0–99)
Triglycerides: 101 mg/dL (ref 0–149)
VLDL Cholesterol Cal: 19 mg/dL (ref 5–40)

## 2023-04-13 LAB — AST: AST: 16 [IU]/L (ref 0–40)

## 2023-04-13 LAB — ALT: ALT: 19 [IU]/L (ref 0–44)

## 2023-04-15 DIAGNOSIS — M9902 Segmental and somatic dysfunction of thoracic region: Secondary | ICD-10-CM | POA: Diagnosis not present

## 2023-04-15 DIAGNOSIS — M9903 Segmental and somatic dysfunction of lumbar region: Secondary | ICD-10-CM | POA: Diagnosis not present

## 2023-04-15 DIAGNOSIS — M9905 Segmental and somatic dysfunction of pelvic region: Secondary | ICD-10-CM | POA: Diagnosis not present

## 2023-04-15 DIAGNOSIS — M51362 Other intervertebral disc degeneration, lumbar region with discogenic back pain and lower extremity pain: Secondary | ICD-10-CM | POA: Diagnosis not present

## 2023-04-17 DIAGNOSIS — M51362 Other intervertebral disc degeneration, lumbar region with discogenic back pain and lower extremity pain: Secondary | ICD-10-CM | POA: Diagnosis not present

## 2023-04-17 DIAGNOSIS — M9905 Segmental and somatic dysfunction of pelvic region: Secondary | ICD-10-CM | POA: Diagnosis not present

## 2023-04-17 DIAGNOSIS — M9902 Segmental and somatic dysfunction of thoracic region: Secondary | ICD-10-CM | POA: Diagnosis not present

## 2023-04-17 DIAGNOSIS — M9903 Segmental and somatic dysfunction of lumbar region: Secondary | ICD-10-CM | POA: Diagnosis not present

## 2023-04-18 DIAGNOSIS — M9905 Segmental and somatic dysfunction of pelvic region: Secondary | ICD-10-CM | POA: Diagnosis not present

## 2023-04-18 DIAGNOSIS — M9902 Segmental and somatic dysfunction of thoracic region: Secondary | ICD-10-CM | POA: Diagnosis not present

## 2023-04-18 DIAGNOSIS — M9903 Segmental and somatic dysfunction of lumbar region: Secondary | ICD-10-CM | POA: Diagnosis not present

## 2023-04-18 DIAGNOSIS — M51362 Other intervertebral disc degeneration, lumbar region with discogenic back pain and lower extremity pain: Secondary | ICD-10-CM | POA: Diagnosis not present

## 2023-04-20 ENCOUNTER — Ambulatory Visit (HOSPITAL_BASED_OUTPATIENT_CLINIC_OR_DEPARTMENT_OTHER)
Admission: EM | Admit: 2023-04-20 | Discharge: 2023-04-20 | Disposition: A | Discharge status: Home / Self Care | Payer: Medicare Other

## 2023-04-20 ENCOUNTER — Encounter (HOSPITAL_BASED_OUTPATIENT_CLINIC_OR_DEPARTMENT_OTHER): Payer: Self-pay

## 2023-04-20 DIAGNOSIS — E11628 Type 2 diabetes mellitus with other skin complications: Secondary | ICD-10-CM

## 2023-04-20 DIAGNOSIS — L089 Local infection of the skin and subcutaneous tissue, unspecified: Secondary | ICD-10-CM | POA: Diagnosis not present

## 2023-04-20 DIAGNOSIS — L02211 Cutaneous abscess of abdominal wall: Secondary | ICD-10-CM

## 2023-04-20 DIAGNOSIS — R739 Hyperglycemia, unspecified: Secondary | ICD-10-CM

## 2023-04-20 DIAGNOSIS — L03311 Cellulitis of abdominal wall: Secondary | ICD-10-CM | POA: Diagnosis not present

## 2023-04-20 DIAGNOSIS — L0291 Cutaneous abscess, unspecified: Secondary | ICD-10-CM | POA: Diagnosis not present

## 2023-04-20 NOTE — ED Triage Notes (Signed)
Reports onset of a sore to right side of abdomen Wednesday. Sore is approx 3/4 inch in diameter. Dry, scab overtop. No active draining at this time.. Area surrounding sore is red, swollen, and hot to touch.

## 2023-04-20 NOTE — Discharge Instructions (Signed)
Please go to the closest ER for further evaluation and management of abdominal wall abscess and elevated blood sugars.

## 2023-04-20 NOTE — ED Provider Notes (Signed)
[facility]    CSN: [id number] Arrival date & time: 04/20/23  0803      History   Chief Complaint Chief Complaint  Patient presents with   Abscess    HPI Lance Hill is a 77 y.o. male.   Patient presents to urgent care for evaluation of right upper quadrant abdominal pain, swelling, redness, and warmth that started on Wednesday, April 17, 2023 (3 days ago).  Swelling and redness has worsened significantly over the last 12 to 24 hours and patient reports severe pain to the area of swelling of the abdomen.  States abdomen feels firm to touch and there is a small lesion to the right upper aspect of the umbilicus that developed on Wednesday when the rest of his symptoms started.  He does not remember any bites, injuries, or scratches to the abdomen to cause abscess/wound.  History of type 2 diabetes, wife states sugars are normally well-controlled and in the 130s.  Over the last 3 to 4 days, sugars have been in the 250s.  This morning blood sugar was 421.  Patient was experiencing some confusion last night per wife but has been eating and drinking normally without nausea, vomiting, diarrhea, urinary symptoms, and fever/chills.  No recent antibiotic/steroid use.  He does not take blood thinners.  He tried to squeeze the wound yesterday and was unable to get any drainage from wound.     Past Medical History:  Diagnosis Date   Arthritis    Combined form of senile cataract 07/22/2013   Formatting of this note might be different from the original. 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   Failed total hip arthroplasty, sequela 06/26/2017   Failed total hip arthroplasty, subsequent encounter 06/26/2017   GERD (gastroesophageal reflux disease)    Glaucoma 08/23/2020   History of  kidney stones    hx of    Hyperlipidemia 08/23/2020   Hypertension    Irregular astigmatism 07/22/2013   Lumbar radicular pain 02/15/2016   Multilevel degenerative disc disease 08/23/2020   Ocular trauma 07/22/2013   Spinal stenosis of lumbar region 02/06/2017   Spinal stenosis, lumbar 02/06/2017   Status post cataract extraction and insertion of intraocular lens, right 10/09/2013   Trochanteric bursitis of right hip 02/17/2019   Type 2 diabetes mellitus with diabetic neuropathy, unspecified (HCC) 08/23/2020   Type II diabetes mellitus, well controlled (HCC) 02/17/2019    Patient Active Problem List   Diagnosis Date Noted   Elevated coronary artery calcium score more than 2000 12/27/2022   Perceptive hearing loss, both sides 09/04/2022   Allergic rhinitis 09/04/2022   Disorder associated with well-controlled type 2 diabetes mellitus (HCC) 09/04/2022   Central opacity of cornea 09/04/2022   Encounter for fitting and adjustment of hearing aid 09/04/2022   Orthostatic hypotension 09/04/2022   Enlarged prostate 08/23/2020   Essential hypertension 08/23/2020   Hyperlipidemia 08/23/2020   Glaucoma 08/23/2020   Multilevel degenerative disc disease 08/23/2020   Type 2 diabetes mellitus with diabetic neuropathy, unspecified (HCC) 08/23/2020   Bilateral sensorineural hearing loss 08/23/2020   Trochanteric bursitis of right hip 02/17/2019   Type II diabetes mellitus, well controlled (HCC) 02/17/2019   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   Failed total hip arthroplasty, subsequent encounter 06/26/2017   Failed  total hip arthroplasty, sequela 06/26/2017   Spinal stenosis of lumbar region 02/06/2017   Spinal stenosis, lumbar 02/06/2017   Lumbar radicular pain 02/15/2016   Corneal epithelial defect 10/09/2013   Status post cataract extraction and insertion of intraocular lens, right 10/09/2013   Combined form of senile cataract 07/22/2013   Corneal laceration  07/22/2013   Irregular astigmatism 07/22/2013   Ocular trauma 07/22/2013   Tubular adenoma of colon 10/24/2005   Gastroesophageal reflux disease 04/30/2004    Past Surgical History:  Procedure Laterality Date   CHOLECYSTECTOMY     EYE SURGERY     right eye surgery , bilateral cataract    JOINT REPLACEMENT     hip and knee replacements    LUMBAR LAMINECTOMY/DECOMPRESSION MICRODISCECTOMY Right 02/06/2017   Procedure: Microlumbar decompression L5-S1 right;  Surgeon: Jene Every, MD;  Location: [facility];  Service: Orthopedics;  Laterality: Right;  90 mins   TONSILLECTOMY     TOTAL HIP REVISION Right 06/26/2017   Procedure: Right hip femoral head revision and abductor tendon repair;  Surgeon: Ollen Gross, MD;  Location: [facility];  Service: Orthopedics;  Laterality: Right;   TOTAL HIP REVISION Right 08/25/2018   Procedure: Right acetabular revision;  Surgeon: Ollen Gross, MD;  Location: [facility];  Service: Orthopedics;  Laterality: Right;  to follow       Home Medications    Prior to Admission medications   Medication Sig Start Date End Date Taking? Authorizing Provider  acetaminophen (TYLENOL) 325 MG tablet Take 650 mg by mouth every 6 (six) hours as needed for moderate pain.    [provider]  amLODipine (NORVASC) 5 MG tablet Take 1 tablet (5 mg total) by mouth daily. 09/04/22   Georgeanna Lea, MD  ARTIFICIAL TEAR OP Place 1 drop into both eyes daily as needed (for dry eyes).    [provider]  atorvastatin (LIPITOR) 40 MG tablet Take 1 tablet (40 mg total) by mouth daily. 02/20/23 05/21/23  Georgeanna Lea, MD  Brinzolamide-Brimonidine 1-0.2 % SUSP Place 1 drop into both eyes 3 (three) times daily. [facility]    [provider]  Cholecalciferol ([employer] VITAMIN D3) 1000 units capsule Take 2,000 Units by mouth daily.    [provider]  dapagliflozin propanediol (FARXIGA) 5 MG TABS tablet Take 5 mg by mouth daily.    [provider]  DULoxetine (CYMBALTA) 30 MG capsule Take 60 mg by mouth daily.    [provider]  gabapentin (NEURONTIN) 300 MG capsule Take 600 mg by mouth 2 (two) times daily.     [provider]  glipiZIDE (GLUCOTROL) 5 MG tablet Take 2.5 mg by mouth daily before breakfast.    [provider]  loratadine (CLARITIN) 10 MG tablet Take 10 mg by mouth daily.    [provider]  losartan (COZAAR) 100 MG tablet Take 100 mg by mouth daily.    [provider]  Menthol, Topical Analgesic, (BIOFREEZE) 4 % GEL Apply 1 application topically daily as needed (pain).    [provider]  metFORMIN (GLUCOPHAGE) 500 MG tablet Take 500 mg by mouth 2 (two) times daily with a meal.     [provider]  mirabegron ER (MYRBETRIQ) 25 MG TB24 tablet Take 25 mg by mouth daily.    [provider]  Omega-3 Fatty Acids (FISH OIL) 1200 MG CAPS Take 1,200 mg by mouth at bedtime.    [provider]  OVER THE COUNTER MEDICATION Place 1  Dose under the tongue daily. Hemp Flower Extract 500 mg drops. 1/2 dropperful daily    [provider]  OZEMPIC, 0.25 OR 0.5 MG/DOSE, 2 MG/3ML SOPN Inject 0.25 mg into the skin once a week. 02/02/23   [provider]  pioglitazone (ACTOS) 45 MG tablet Take 45 mg by mouth daily.    [provider]  propranolol (INDERAL) 10 MG tablet Take 10 mg by mouth daily.    [provider]  ranitidine (ZANTAC) 300 MG tablet Take 300 mg by mouth 2 (two) times daily.     [provider]  simvastatin (ZOCOR) 20 MG tablet Take 20 mg by mouth daily at 6 PM. 01/04/23   [provider]  traMADol (ULTRAM) 50 MG tablet Take 1-2 tablets (50-100 mg total) by mouth every 6 (six) hours as needed for moderate pain (If Vicodin alone ineffective). 08/26/18   Edmisten, Kristie L, PA  trolamine salicylate (ASPERCREME) 10 % cream Apply 1 application topically as needed for muscle pain.    [provider]    Family History History reviewed. No pertinent family history.  Social History Social History   Tobacco Use   Smoking status: Never   Smokeless tobacco: Never  Vaping Use   Vaping status: Never Used  Substance Use Topics   Alcohol use: Yes    Comment: glass wine once a month   Drug use: No     Allergies   Lisinopril   Review of Systems Review of Systems Per HPI  Physical Exam Triage Vital Signs ED Triage Vitals [04/20/23 0821]  Encounter Vitals Group     BP (!) 149/80     Systolic BP Percentile      Diastolic BP Percentile      Pulse Rate 84     Resp 20     Temp 97.8 F (36.6 C)     Temp Source Oral     SpO2 95 %     Weight 181 lb (82.1 kg)     Height      Head Circumference      Peak Flow      Pain Score 7     Pain Loc      Pain Education      Exclude from Growth Chart    No data found.  Updated Vital Signs BP (!) 149/80 (BP Location: Right Arm)   Pulse 84   Temp 97.8 F (36.6 C) (Oral)   Resp 20   Wt 181 lb (82.1 kg)   SpO2 95%   BMI 27.52 kg/m   Visual Acuity Right Eye Distance:   Left Eye Distance:   Bilateral Distance:    Right Eye Near:   Left Eye Near:    Bilateral Near:     Physical Exam Vitals and nursing note reviewed.  Constitutional:      Appearance: He is ill-appearing. He is not toxic-appearing.  HENT:     Head: Normocephalic and atraumatic.     Right Ear: Hearing and external ear normal.     Left Ear: Hearing and external ear normal.     Nose: Nose normal.     Mouth/Throat:     Lips: Pink.  Eyes:     General: Lids are normal. Vision grossly intact. Gaze aligned appropriately.     Extraocular Movements: Extraocular movements intact.     Conjunctiva/sclera: Conjunctivae normal.  Pulmonary:     Effort: Pulmonary effort is normal.  Abdominal:     General: Bowel  sounds are normal.     Palpations: Abdomen is soft.     Tenderness: There is abdominal tenderness (Tender to palpation area of abscess and  swelling to the right lower quadrant abdomen). There is no right CVA tenderness, left CVA tenderness or guarding.     Comments: Firm abdominal wall abscess with underlying soft tissue swelling and surrounding erythema present.  Skin feels hot to touch.  Patient very tender to palpation surrounding abdominal wall abscess.  See image below.  Musculoskeletal:     Cervical back: Neck supple.  Skin:    General: Skin is warm and dry.     Capillary Refill: Capillary refill takes less than 2 seconds.     Findings: No rash.  Neurological:     General: No focal deficit present.     Mental Status: He is alert and oriented to person, place, and time. Mental status is at baseline.     Cranial Nerves: No dysarthria or facial asymmetry.  Psychiatric:        Mood and Affect: Mood normal.        Speech: Speech normal.        Behavior: Behavior normal.        Thought Content: Thought content normal.        Judgment: Judgment normal.   Abdomen   UC Treatments / Results  Labs (all labs ordered are listed, but only abnormal results are displayed) Labs Reviewed - No data to display  EKG   Radiology No results found.  Procedures Procedures (including critical care time)  Medications Ordered in UC Medications - No data to display  Initial Impression / Assessment and Plan / UC Course  I have reviewed the triage vital signs and the nursing notes.  Pertinent labs & imaging results that were available during my care of the patient were reviewed by me and considered in my medical decision making (see chart for details).   1.  Abdominal wall abscess, hyperglycemia, type 2 diabetes with other skin complication Abdominal wall abscess with surrounding area of erythema and warmth to touch is concerning for significant intra-abdominal infection.  Given size of abscess/erythema and report of symptomatic hyperglycemia I would like for patient to proceed to the nearest emergency department for stat blood  work and likely advanced imaging to rule out deep intra-abdominal abscess.  Discussed recommendations and risks of deferring ER visit with patient and his wife who expressed understanding and agreement with plan. Though he is in significant pain, he is stable for transport to the nearest emergency department via personal vehicle with wife.  Discharge from urgent care in stable condition.  Final Clinical Impressions(s) / UC Diagnoses   Final diagnoses:  Abdominal wall abscess  Hyperglycemia  Type 2 diabetes mellitus with other skin complication, without long-term current use of insulin [facility])     Discharge Instructions      Please go to the closest ER for further evaluation and management of abdominal wall abscess and elevated blood sugars.     ED Prescriptions   None    PDMP not reviewed this encounter.   Reita May [place], [place] 04/20/23 [phone]

## 2023-04-21 ENCOUNTER — Encounter (HOSPITAL_BASED_OUTPATIENT_CLINIC_OR_DEPARTMENT_OTHER): Payer: Self-pay

## 2023-04-21 ENCOUNTER — Emergency Department (HOSPITAL_BASED_OUTPATIENT_CLINIC_OR_DEPARTMENT_OTHER)
Admission: EM | Admit: 2023-04-21 | Discharge: 2023-04-21 | Disposition: A | Discharge status: Home / Self Care | Payer: Medicare Other | Attending: Emergency Medicine | Admitting: Emergency Medicine

## 2023-04-21 ENCOUNTER — Other Ambulatory Visit: Payer: Self-pay

## 2023-04-21 DIAGNOSIS — L03311 Cellulitis of abdominal wall: Secondary | ICD-10-CM | POA: Insufficient documentation

## 2023-04-21 DIAGNOSIS — E119 Type 2 diabetes mellitus without complications: Secondary | ICD-10-CM | POA: Diagnosis not present

## 2023-04-21 DIAGNOSIS — Z79899 Other long term (current) drug therapy: Secondary | ICD-10-CM | POA: Diagnosis not present

## 2023-04-21 DIAGNOSIS — I10 Essential (primary) hypertension: Secondary | ICD-10-CM | POA: Insufficient documentation

## 2023-04-21 DIAGNOSIS — Z7984 Long term (current) use of oral hypoglycemic drugs: Secondary | ICD-10-CM | POA: Diagnosis not present

## 2023-04-21 DIAGNOSIS — R1011 Right upper quadrant pain: Secondary | ICD-10-CM | POA: Diagnosis present

## 2023-04-21 DIAGNOSIS — D72829 Elevated white blood cell count, unspecified: Secondary | ICD-10-CM | POA: Diagnosis not present

## 2023-04-21 LAB — CBC WITH DIFFERENTIAL/PLATELET
Abs Immature Granulocytes: 0.06 10*3/uL (ref 0.00–0.07)
Basophils Absolute: 0 10*3/uL (ref 0.0–0.1)
Basophils Relative: 0 %
Eosinophils Absolute: 0.2 10*3/uL (ref 0.0–0.5)
Eosinophils Relative: 1 %
HCT: 38.1 % — ABNORMAL LOW (ref 39.0–52.0)
Hemoglobin: 12.3 g/dL — ABNORMAL LOW (ref 13.0–17.0)
Immature Granulocytes: 1 %
Lymphocytes Relative: 14 %
Lymphs Abs: 1.6 10*3/uL (ref 0.7–4.0)
MCH: 29.4 pg (ref 26.0–34.0)
MCHC: 32.3 g/dL (ref 30.0–36.0)
MCV: 91.1 fL (ref 80.0–100.0)
Monocytes Absolute: 1.1 10*3/uL — ABNORMAL HIGH (ref 0.1–1.0)
Monocytes Relative: 10 %
Neutro Abs: 8.9 10*3/uL — ABNORMAL HIGH (ref 1.7–7.7)
Neutrophils Relative %: 74 %
Platelets: 172 10*3/uL (ref 150–400)
RBC: 4.18 MIL/uL — ABNORMAL LOW (ref 4.22–5.81)
RDW: 13.9 % (ref 11.5–15.5)
WBC: 12 10*3/uL — ABNORMAL HIGH (ref 4.0–10.5)
nRBC: 0 % (ref 0.0–0.2)

## 2023-04-21 LAB — COMPREHENSIVE METABOLIC PANEL
ALT: 16 U/L (ref 0–44)
AST: 19 U/L (ref 15–41)
Albumin: 3.4 g/dL — ABNORMAL LOW (ref 3.5–5.0)
Alkaline Phosphatase: 56 U/L (ref 38–126)
Anion gap: 8 (ref 5–15)
BUN: 21 mg/dL (ref 8–23)
CO2: 23 mmol/L (ref 22–32)
Calcium: 9.1 mg/dL (ref 8.9–10.3)
Chloride: 106 mmol/L (ref 98–111)
Creatinine, Ser: 1.26 mg/dL — ABNORMAL HIGH (ref 0.61–1.24)
GFR, Estimated: 58 mL/min — ABNORMAL LOW (ref >60)
Glucose, Bld: 149 mg/dL — ABNORMAL HIGH (ref 70–99)
Potassium: 4.8 mmol/L (ref 3.5–5.1)
Sodium: 137 mmol/L (ref 135–145)
Total Bilirubin: 1.8 mg/dL — ABNORMAL HIGH (ref <1.2)
Total Protein: 6.7 g/dL (ref 6.5–8.1)

## 2023-04-21 LAB — CBG MONITORING, ED: Glucose-Capillary: 120 mg/dL — ABNORMAL HIGH (ref 70–99)

## 2023-04-21 NOTE — ED Triage Notes (Signed)
Went to UC yesterday for abscess on abdomen. Redness and swelling noted. Recommended to come to ED yesterday as has been having hyperglycemia & to r/o intra-abdominal infection. Denies fevers. Had I&D yesterday with minimal output.

## 2023-04-21 NOTE — ED Provider Notes (Incomplete)
Lance Hill   CSN: [id number] Arrival date & time: 04/21/23  1553     History {Add pertinent medical, surgical, social history, OB history to HPI:1} Chief Complaint  Patient presents with   Abscess    Lance Hill is a 77 y.o. male.  HPI     77 year old male with history of DM, hypertension, hyperlipidemia,    Presents with 4 days of right upper quadrant abdominal pain, swelling, redness and warmth that started on Wednesday.  Swelling and redness were worsening and he went to the urgent care.  Was seen yesterday and diagnosed with likely abscess and they recommended he go to the emergency department.  It started as something that looked like a pimple, then thought more like a boil, was able to get some fluid out. After went to UC went to [place] because it was closer, was examined and had incision and drainage with minimal output per family. Packing   Past Medical History:  Diagnosis Date   Arthritis    Combined form of senile cataract 07/22/2013   Formatting of this Hill might be different from the original. 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   Failed total hip arthroplasty, sequela 06/26/2017   Failed total hip arthroplasty, subsequent encounter 06/26/2017   GERD (gastroesophageal reflux disease)    Glaucoma 08/23/2020   History of kidney stones    hx of    Hyperlipidemia 08/23/2020   Hypertension    Irregular astigmatism 07/22/2013   Lumbar radicular pain 02/15/2016   Multilevel degenerative disc disease 08/23/2020   Ocular trauma 07/22/2013   Spinal stenosis of lumbar region 02/06/2017   Spinal stenosis, lumbar 02/06/2017   Status post cataract extraction and insertion of intraocular lens, right 10/09/2013    Trochanteric bursitis of right hip 02/17/2019   Type 2 diabetes mellitus with diabetic neuropathy, unspecified (HCC) 08/23/2020   Type II diabetes mellitus, well controlled (HCC) 02/17/2019    Past Surgical History:  Procedure Laterality Date   CHOLECYSTECTOMY     EYE SURGERY     right eye surgery , bilateral cataract    JOINT REPLACEMENT     hip and knee replacements    LUMBAR LAMINECTOMY/DECOMPRESSION MICRODISCECTOMY Right 02/06/2017   Procedure: Microlumbar decompression L5-S1 right;  Surgeon: Jene Every, MD;  Location: [facility];  Service: Orthopedics;  Laterality: Right;  90 mins   TONSILLECTOMY     TOTAL HIP REVISION Right 06/26/2017   Procedure: Right hip femoral head revision and abductor tendon repair;  Surgeon: Ollen Gross, MD;  Location: [facility];  Service: Orthopedics;  Laterality: Right;   TOTAL HIP REVISION Right 08/25/2018   Procedure: Right acetabular revision;  Surgeon: Ollen Gross, MD;  Location: [facility];  Service: Orthopedics;  Laterality: Right;  to follow    Home Medications Prior to Admission medications   Medication Sig Start Date End Date Taking? Authorizing Provider  acetaminophen (TYLENOL) 325 MG tablet Take 650 mg by mouth every 6 (six) hours as needed for moderate pain.    [provider]  amLODipine (NORVASC) 5 MG tablet Take 1 tablet (5 mg total) by mouth daily. 09/04/22   Georgeanna Lea, MD  ARTIFICIAL TEAR OP Place 1 drop into both eyes daily as needed (for dry eyes).  [provider]  atorvastatin (LIPITOR) 40 MG tablet Take 1 tablet (40 mg total) by mouth daily. 02/20/23 05/21/23  Georgeanna Lea, MD  Brinzolamide-Brimonidine 1-0.2 % SUSP Place 1 drop into both eyes 3 (three) times daily. [facility]    [provider]  Cholecalciferol ([employer] VITAMIN D3) 1000 units capsule Take 2,000 Units by mouth daily.    [provider]  dapagliflozin propanediol (FARXIGA) 5 MG TABS tablet Take 5 mg by mouth  daily.    [provider]  DULoxetine (CYMBALTA) 30 MG capsule Take 60 mg by mouth daily.    [provider]  gabapentin (NEURONTIN) 300 MG capsule Take 600 mg by mouth 2 (two) times daily.     [provider]  glipiZIDE (GLUCOTROL) 5 MG tablet Take 2.5 mg by mouth daily before breakfast.    [provider]  loratadine (CLARITIN) 10 MG tablet Take 10 mg by mouth daily.    [provider]  losartan (COZAAR) 100 MG tablet Take 100 mg by mouth daily.    [provider]  Menthol, Topical Analgesic, (BIOFREEZE) 4 % GEL Apply 1 application topically daily as needed (pain).    [provider]  metFORMIN (GLUCOPHAGE) 500 MG tablet Take 500 mg by mouth 2 (two) times daily with a meal.     [provider]  mirabegron ER (MYRBETRIQ) 25 MG TB24 tablet Take 25 mg by mouth daily.    [provider]  Omega-3 Fatty Acids (FISH OIL) 1200 MG CAPS Take 1,200 mg by mouth at bedtime.    [provider]  OVER THE COUNTER MEDICATION Place 1 Dose under the tongue daily. Hemp Flower Extract 500 mg drops. 1/2 dropperful daily    [provider]  OZEMPIC, 0.25 OR 0.5 MG/DOSE, 2 MG/3ML SOPN Inject 0.25 mg into the skin once a week. 02/02/23   [provider]  pioglitazone (ACTOS) 45 MG tablet Take 45 mg by mouth daily.    [provider]  propranolol (INDERAL) 10 MG tablet Take 10 mg by mouth daily.    [provider]  ranitidine (ZANTAC) 300 MG tablet Take 300 mg by mouth 2 (two) times daily.     [provider]  simvastatin (ZOCOR) 20 MG tablet Take 20 mg by mouth daily at 6 PM. 01/04/23   [provider]  traMADol (ULTRAM) 50 MG tablet Take 1-2 tablets (50-100 mg total) by mouth every 6 (six) hours as needed for moderate pain (If Vicodin alone ineffective). 08/26/18   Edmisten, Kristie L, PA  trolamine salicylate (ASPERCREME) 10 % cream Apply 1 application topically as needed for muscle  pain.    [provider]      Allergies    Lisinopril    Review of Systems   Review of Systems  Physical Exam Updated Vital Signs BP (!) 142/79 (BP Location: Right Arm)   Pulse 71   Temp 97.6 F (36.4 C) (Oral)   Resp 17   Ht 5\' 8"  (1.727 m)   Wt 81.2 kg   SpO2 100%   BMI 27.22 kg/m  Physical Exam  ED Results / Procedures / Treatments   Labs (all labs ordered are listed, but only abnormal results are displayed) Labs Reviewed  CBG MONITORING, ED - Abnormal; Notable for the following components:      Result Value   Glucose-Capillary 120 (*)    All other components within normal limits  COMPREHENSIVE METABOLIC PANEL  CBC WITH DIFFERENTIAL/PLATELET  EKG None  Radiology No results found.  Procedures Procedures  {Document cardiac monitor, telemetry assessment procedure when appropriate:1}  Medications Ordered in ED Medications - No data to display  ED Course/ Medical Decision Making/ A&P   {   Click here for ABCD2, HEART and other calculatorsREFRESH Hill before signing :1}                              Medical Decision Making  ***  {Document critical care time when appropriate:1} {Document review of labs and clinical decision tools ie heart score, Chads2Vasc2 etc:1}  {Document your independent review of radiology images, and any outside records:1} {Document your discussion with family members, caretakers, and with consultants:1} {Document social determinants of health affecting pt's care:1} {Document your decision making why or why not admission, treatments were needed:1} Final Clinical Impression(s) / ED Diagnoses Final diagnoses:  None    Rx / DC Orders ED Discharge Orders     None

## 2023-04-22 ENCOUNTER — Encounter: Payer: Self-pay | Admitting: Cardiology

## 2023-04-22 ENCOUNTER — Ambulatory Visit: Payer: Medicare Other | Attending: Cardiology | Admitting: Cardiology

## 2023-04-22 VITALS — BP 120/70 | HR 70 | Ht 68.0 in | Wt 175.4 lb

## 2023-04-22 DIAGNOSIS — I951 Orthostatic hypotension: Secondary | ICD-10-CM | POA: Diagnosis not present

## 2023-04-22 DIAGNOSIS — R931 Abnormal findings on diagnostic imaging of heart and coronary circulation: Secondary | ICD-10-CM | POA: Diagnosis not present

## 2023-04-22 DIAGNOSIS — I10 Essential (primary) hypertension: Secondary | ICD-10-CM | POA: Diagnosis not present

## 2023-04-22 DIAGNOSIS — E1140 Type 2 diabetes mellitus with diabetic neuropathy, unspecified: Secondary | ICD-10-CM | POA: Insufficient documentation

## 2023-04-22 NOTE — Addendum Note (Signed)
Addended by: Baldo Ash D on: 04/22/2023 10:37 AM   Modules accepted: Orders

## 2023-04-22 NOTE — Patient Instructions (Signed)
Medication Instructions:  Your physician recommends that you continue on your current medications as directed. Please refer to the Current Medication list given to you today.  *If you need a refill on your cardiac medications before your next appointment, please call your pharmacy*   Lab Work: Your physician recommends that you return for lab work in: 1 month  You need to have labs done when you are fasting.  You can come Monday through Friday 8:30 am to 12:00 pm and 1:15 to 4:30. You do not need to make an appointment as the order has already been placed. The labs you are going to have done are Hgb A1C.    Testing/Procedures: None Ordered   Follow-Up: At [facility], you and your health needs are our priority.  As part of our continuing mission to provide you with exceptional heart care, we have created designated Provider Care Teams.  These Care Teams include your primary Cardiologist (physician) and Advanced Practice Providers (APPs -  Physician Assistants and Nurse Practitioners) who all work together to provide you with the care you need, when you need it.  We recommend signing up for the patient portal called "MyChart".  Sign up information is provided on this After Visit Summary.  MyChart is used to connect with patients for Virtual Visits (Telemedicine).  Patients are able to view lab/test results, encounter notes, upcoming appointments, etc.  Non-urgent messages can be sent to your provider as well.   To learn more about what you can do with MyChart, go to [email or web address].    Your next appointment:   4 month(s)  The format for your next appointment:   In Person  Provider:   Gypsy Balsam, MD    Other Instructions NA

## 2023-04-22 NOTE — Progress Notes (Signed)
Cardiology Office Note:    Date:  04/22/2023   ID:  Lance Hill, DOB 08-Oct-1945, MRN [id number]  PCP:  Buckner Malta, MD  Cardiologist:  Gypsy Balsam, MD    Referring MD: Buckner Malta, MD   Chief Complaint  Patient presents with   Follow-up    History of Present Illness:    Lance Hill is a 77 y.o. male past medical history significant for longstanding diabetes, essential hypertension, dyslipidemia, elevated calcium score 2200, stress test negative.  Comes today to months for follow-up.  Overall he is doing fair recently he ended up having cellulitis of the abdominal wall yesterday he went to the emergency room abscess being drained and then he was put on the antibiotic.  Still slightly doing better.  Denies have any typical cardiac problems there is no chest pain tightness squeezing pressure burning chest.  He is not too active he said that he tried to play golf once a week but weather is very cold right now so he does not do it right now.  He is primary care physician see him every 6 months and then he also goes to [place].  Past Medical History:  Diagnosis Date   Arthritis    Combined form of senile cataract 07/22/2013   Formatting of this note might be different from the original. 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   Failed total hip arthroplasty, sequela 06/26/2017   Failed total hip arthroplasty, subsequent encounter 06/26/2017   GERD (gastroesophageal reflux disease)    Glaucoma 08/23/2020   History of kidney stones    hx of    Hyperlipidemia 08/23/2020   Hypertension    Irregular astigmatism 07/22/2013   Lumbar radicular pain 02/15/2016   Multilevel degenerative disc disease 08/23/2020   Ocular trauma 07/22/2013   Spinal stenosis of lumbar region 02/06/2017    Spinal stenosis, lumbar 02/06/2017   Status post cataract extraction and insertion of intraocular lens, right 10/09/2013   Trochanteric bursitis of right hip 02/17/2019   Type 2 diabetes mellitus with diabetic neuropathy, unspecified (HCC) 08/23/2020   Type II diabetes mellitus, well controlled (HCC) 02/17/2019    Past Surgical History:  Procedure Laterality Date   CHOLECYSTECTOMY     EYE SURGERY     right eye surgery , bilateral cataract    JOINT REPLACEMENT     hip and knee replacements    LUMBAR LAMINECTOMY/DECOMPRESSION MICRODISCECTOMY Right 02/06/2017   Procedure: Microlumbar decompression L5-S1 right;  Surgeon: Jene Every, MD;  Location: [facility];  Service: Orthopedics;  Laterality: Right;  90 mins   TONSILLECTOMY     TOTAL HIP REVISION Right 06/26/2017   Procedure: Right hip femoral head revision and abductor tendon repair;  Surgeon: Ollen Gross, MD;  Location: [facility];  Service: Orthopedics;  Laterality: Right;   TOTAL HIP REVISION Right 08/25/2018   Procedure: Right acetabular revision;  Surgeon: Ollen Gross, MD;  Location: [facility];  Service: Orthopedics;  Laterality: Right;  to follow    Current Medications: Current Meds  Medication Sig   acetaminophen (TYLENOL) 325 MG tablet Take 650 mg by mouth every 6 (six) hours as needed for moderate pain.   amLODipine (NORVASC) 5 MG tablet Take 1 tablet (5 mg total) by mouth daily.   ARTIFICIAL TEAR OP Place 1 drop into both  eyes daily as needed (for dry eyes).   atorvastatin (LIPITOR) 40 MG tablet Take 1 tablet (40 mg total) by mouth daily.   Brinzolamide-Brimonidine 1-0.2 % SUSP Place 1 drop into both eyes 3 (three) times daily. SIMBRINZA   Cholecalciferol (CVS VITAMIN D3) 1000 units capsule Take 2,000 Units by mouth daily.   dapagliflozin propanediol (FARXIGA) 5 MG TABS tablet Take 5 mg by mouth daily.   doxycycline (ADOXA) 100 MG tablet Take 100 mg by mouth 2 (two) times daily.   DULoxetine (CYMBALTA) 30 MG capsule  Take 60 mg by mouth daily.   gabapentin (NEURONTIN) 300 MG capsule Take 600 mg by mouth 2 (two) times daily.    glipiZIDE (GLUCOTROL) 5 MG tablet Take 2.5 mg by mouth daily before breakfast.   loratadine (CLARITIN) 10 MG tablet Take 10 mg by mouth daily.   losartan (COZAAR) 100 MG tablet Take 100 mg by mouth daily.   Menthol, Topical Analgesic, (BIOFREEZE) 4 % GEL Apply 1 application topically daily as needed (pain).   metFORMIN (GLUCOPHAGE) 500 MG tablet Take 500 mg by mouth 2 (two) times daily with a meal.    mirabegron ER (MYRBETRIQ) 25 MG TB24 tablet Take 25 mg by mouth daily.   Omega-3 Fatty Acids (FISH OIL) 1200 MG CAPS Take 1,200 mg by mouth at bedtime.   OVER THE COUNTER MEDICATION Place 1 Dose under the tongue daily. Hemp Flower Extract 500 mg drops. 1/2 dropperful daily   oxyCODONE (OXY IR/ROXICODONE) 5 MG immediate release tablet Take 5 mg by mouth every 6 (six) hours as needed for moderate pain (pain score 4-6) or severe pain (pain score 7-10).   OZEMPIC, 0.25 OR 0.5 MG/DOSE, 2 MG/3ML SOPN Inject 0.25 mg into the skin once a week.   pioglitazone (ACTOS) 45 MG tablet Take 45 mg by mouth daily.   propranolol (INDERAL) 10 MG tablet Take 10 mg by mouth daily.   ranitidine (ZANTAC) 300 MG tablet Take 300 mg by mouth 2 (two) times daily.    simvastatin (ZOCOR) 20 MG tablet Take 20 mg by mouth daily at 6 PM.   traMADol (ULTRAM) 50 MG tablet Take 1-2 tablets (50-100 mg total) by mouth every 6 (six) hours as needed for moderate pain (If Vicodin alone ineffective).   trolamine salicylate (ASPERCREME) 10 % cream Apply 1 application topically as needed for muscle pain.     Allergies:   Lisinopril   Social History   Socioeconomic History   Marital status: Married    Spouse name: Not on file   Number of children: Not on file   Years of education: Not on file   Highest education level: Not on file  Occupational History   Not on file  Tobacco Use   Smoking status: Never   Smokeless  tobacco: Never  Vaping Use   Vaping status: Never Used  Substance and Sexual Activity   Alcohol use: Yes    Comment: glass wine once a month   Drug use: No   Sexual activity: Not on file  Other Topics Concern   Not on file  Social History Narrative   Not on file   Social Drivers of Health   Financial Resource Strain: Not on file  Food Insecurity: Not on file  Transportation Needs: Not on file  Physical Activity: Not on file  Stress: Not on file  Social Connections: Not on file     Family History: The patient's family history is not on file. ROS:   Please see  the history of present illness.    All 14 point review of systems negative except as described per history of present illness  EKGs/Labs/Other Studies Reviewed:         Recent Labs: 04/21/2023: ALT 16; BUN 21; Creatinine, Ser 1.26; Hemoglobin 12.3; Platelets 172; Potassium 4.8; Sodium 137  Recent Lipid Panel    Component Value Date/Time   CHOL 128 04/12/2023 1145   TRIG 101 04/12/2023 1145   HDL 41 04/12/2023 1145   CHOLHDL 3.1 04/12/2023 1145   LDLCALC 68 04/12/2023 1145    Physical Exam:    VS:  BP 120/70 (BP Location: Right Arm, Patient Position: Sitting)   Pulse 70   Ht 5\' 8"  (1.727 m)   Wt 175 lb 6.4 oz (79.6 kg)   SpO2 96%   BMI 26.67 kg/m     Wt Readings from Last 3 Encounters:  04/22/23 175 lb 6.4 oz (79.6 kg)  04/21/23 179 lb (81.2 kg)  04/20/23 181 lb (82.1 kg)     GEN:  Well nourished, well developed in no acute distress HEENT: Normal NECK: No JVD; No carotid bruits LYMPHATICS: No lymphadenopathy CARDIAC: RRR, no murmurs, no rubs, no gallops RESPIRATORY:  Clear to auscultation without rales, wheezing or rhonchi  ABDOMEN: Soft, non-tender, non-distended MUSCULOSKELETAL:  No edema; No deformity  SKIN: Warm and dry LOWER EXTREMITIES: no swelling NEUROLOGIC:  Alert and oriented x 3 PSYCHIATRIC:  Normal affect   ASSESSMENT:    1. Elevated coronary artery calcium score more than  2000   2. Essential hypertension   3. Orthostatic hypotension   4. Type 2 diabetes mellitus with diabetic neuropathy, without long-term current use of insulin (HCC)    PLAN:    In order of problems listed above:  Coronary disease calcium score 2200 but stress test negative so it look like he does not have obstructive disease the key is risk factors modifications, he is already on antiplatelet therapy which I will continue. Dyslipidemia he is on statin.  He is fasting lipid profile show LDL 68 HDL 41 this is from December 13 we will continue present management. Diabetes last hemoglobin A1c I have from summer of this year which was 10.5.  I ask him to recheck his hemoglobin A1c but will give him a month of recovery after his cellulitis before he check that. Orthostatic hypotension looks good from that point of view.  Will continue present management   Medication Adjustments/Labs and Tests Ordered: Current medicines are reviewed at length with the patient today.  Concerns regarding medicines are outlined above.  No orders of the defined types were placed in this encounter.  Medication changes: No orders of the defined types were placed in this encounter.   Signed, Georgeanna Lea, MD, [facility] 04/22/2023 10:19 AM    [facility]

## 2023-04-29 DIAGNOSIS — M9902 Segmental and somatic dysfunction of thoracic region: Secondary | ICD-10-CM | POA: Diagnosis not present

## 2023-04-29 DIAGNOSIS — M51362 Other intervertebral disc degeneration, lumbar region with discogenic back pain and lower extremity pain: Secondary | ICD-10-CM | POA: Diagnosis not present

## 2023-04-29 DIAGNOSIS — M9903 Segmental and somatic dysfunction of lumbar region: Secondary | ICD-10-CM | POA: Diagnosis not present

## 2023-04-29 DIAGNOSIS — M9905 Segmental and somatic dysfunction of pelvic region: Secondary | ICD-10-CM | POA: Diagnosis not present

## 2023-05-02 DIAGNOSIS — M9905 Segmental and somatic dysfunction of pelvic region: Secondary | ICD-10-CM | POA: Diagnosis not present

## 2023-05-02 DIAGNOSIS — M51362 Other intervertebral disc degeneration, lumbar region with discogenic back pain and lower extremity pain: Secondary | ICD-10-CM | POA: Diagnosis not present

## 2023-05-02 DIAGNOSIS — M9902 Segmental and somatic dysfunction of thoracic region: Secondary | ICD-10-CM | POA: Diagnosis not present

## 2023-05-02 DIAGNOSIS — M9903 Segmental and somatic dysfunction of lumbar region: Secondary | ICD-10-CM | POA: Diagnosis not present

## 2023-05-03 DIAGNOSIS — M9905 Segmental and somatic dysfunction of pelvic region: Secondary | ICD-10-CM | POA: Diagnosis not present

## 2023-05-03 DIAGNOSIS — M9903 Segmental and somatic dysfunction of lumbar region: Secondary | ICD-10-CM | POA: Diagnosis not present

## 2023-05-03 DIAGNOSIS — M51362 Other intervertebral disc degeneration, lumbar region with discogenic back pain and lower extremity pain: Secondary | ICD-10-CM | POA: Diagnosis not present

## 2023-05-03 DIAGNOSIS — M9902 Segmental and somatic dysfunction of thoracic region: Secondary | ICD-10-CM | POA: Diagnosis not present

## 2023-05-06 ENCOUNTER — Telehealth: Payer: Self-pay | Admitting: Pharmacist

## 2023-05-06 DIAGNOSIS — M9902 Segmental and somatic dysfunction of thoracic region: Secondary | ICD-10-CM | POA: Diagnosis not present

## 2023-05-06 DIAGNOSIS — M9903 Segmental and somatic dysfunction of lumbar region: Secondary | ICD-10-CM | POA: Diagnosis not present

## 2023-05-06 DIAGNOSIS — M9905 Segmental and somatic dysfunction of pelvic region: Secondary | ICD-10-CM | POA: Diagnosis not present

## 2023-05-06 DIAGNOSIS — M51362 Other intervertebral disc degeneration, lumbar region with discogenic back pain and lower extremity pain: Secondary | ICD-10-CM | POA: Diagnosis not present

## 2023-05-06 NOTE — Progress Notes (Signed)
 05/06/2023 Name: Lance Hill MRN: [id number] DOB: 06-Apr-1946  05/06/2023  Lance Hill 1946-02-28 [id number]   2025 Medication Assistance Renewal Application Summary:  Patient was outreached regarding medication assistance renewal for 2025. Verified address, anticipated insurance for 2025, and income has not changed. Patient remains interested in PAP for 2025 for Farxiga & Ozempic, (no other new medications were identified for medication assistance.    Medication Review Findings:  Patient is no longer taking Simvastatin --he is now on Atorvastatin  Duloxetine 30mg  2 capsules daily was not the [facility] EMR-will send notice Ozempic-patient has not taken for 6 months due to cost.  He has an appointment with his PCP next month to discuss.  Medications Reviewed Today     Reviewed by Jolee Cassius PARAS, [facility] (Pharmacist) on 05/06/23 at 1402  Med List Status: <None>   Medication Order Taking? Sig Documenting Provider Last Dose Status Informant  acetaminophen  (TYLENOL ) 325 MG tablet [id number] Yes Take 650 mg by mouth every 6 (six) hours as needed for moderate pain. [provider] Taking Active Spouse/Significant Other  amLODipine  (NORVASC ) 5 MG tablet [id number] Yes Take 1 tablet (5 mg total) by mouth daily. Krasowski, Robert J, MD Taking Active   ARTIFICIAL TEAR OP [id number] Yes Place 1 drop into both eyes daily as needed (for dry eyes). [provider] Taking Active Spouse/Significant Other  aspirin  EC 81 MG tablet [id number] Yes Take 81 mg by mouth daily. Swallow whole. [provider] Taking Active   atorvastatin  (LIPITOR) 40 MG tablet [id number] Yes Take 1 tablet (40 mg total) by mouth daily. Krasowski, Robert J, MD Taking Active   Brinzolamide -Brimonidine  1-0.2 % SUSP [id number] Yes Place 1 drop into both eyes 3 (three) times daily. [facility] [provider] Taking Active Spouse/Significant Other  Cholecalciferol ([employer]S VITAMIN D3) 1000 units capsule [id number] Yes  Take 2,000 Units by mouth daily. [provider] Taking Active Spouse/Significant Other           Med Note ([place] , VERNA A   Thu Aug 21, 2018  1:07 PM) Last dose 08/19/18   cyanocobalamin 1000 MCG tablet [id number] Yes Take 1,000 mcg by mouth daily. [provider] Taking Active   dapagliflozin propanediol (FARXIGA) 5 MG TABS tablet [id number] Yes Take 5 mg by mouth daily. [provider] Taking Active   DULoxetine (CYMBALTA) 30 MG capsule [id number] Yes Take 60 mg by mouth daily. [provider] Taking Active   gabapentin  (NEURONTIN ) 300 MG capsule [id number] Yes Take 600 mg by mouth 2 (two) times daily.  [provider] Taking Active Spouse/Significant Other  glipiZIDE (GLUCOTROL) 5 MG tablet [id number] Yes Take 2.5 mg by mouth daily before breakfast. [provider] Taking Active   loratadine  (CLARITIN ) 10 MG tablet [id number] Yes Take 10 mg by mouth daily. [provider] Taking Active Spouse/Significant Other  losartan  (COZAAR ) 100 MG tablet [id number] Yes Take 100 mg by mouth daily. [provider] Taking Active Spouse/Significant Other  Menthol , Topical Analgesic, (BIOFREEZE) 4 % GEL [id number] Yes Apply 1 application topically daily as needed (pain). [provider] Taking Active Spouse/Significant Other  metFORMIN (GLUCOPHAGE) 500 MG tablet [id number] Yes Take 500 mg by mouth 2 (two) times daily with a meal.  [provider] Taking Active Spouse/Significant Other  mirabegron ER (MYRBETRIQ) 25 MG TB24 tablet [id number] Yes Take 25 mg by mouth daily. [provider] Taking Active   Omega-3 Fatty Acids (FISH OIL) 1200 MG CAPS [id number] Yes Take 1,200 mg by mouth at bedtime. [provider] Taking Active Spouse/Significant Other           Med Note ([place] , VERNA A   Thu Aug 21, 2018  1:09 PM) Last dose 08/19/18  omeprazole (PRILOSEC) 20 MG capsule [id number] Yes Take 20 mg by mouth daily.  [provider] Taking Active   OVER THE COUNTER MEDICATION [id number] Yes Place 1 Dose under the tongue daily. Hemp Flower Extract 500 mg drops. 1/2 dropperful daily [provider] Taking Active Spouse/Significant Other           Med Note ([place] , VERNA A   Thu Aug 21, 2018  1:09 PM) Last dose 08/19/18  oxyCODONE  (OXY IR/ROXICODONE ) 5 MG immediate release tablet [id number] Yes Take 5 mg by mouth every 6 (six) hours as needed for moderate pain (pain score 4-6) or severe pain (pain score 7-10). [provider] Taking Active   OZEMPIC, 0.25 OR 0.5 MG/DOSE, 2 MG/3ML SOPN [id number] Yes Inject 0.25 mg into the skin once a week. [provider] Taking Active   pioglitazone  (ACTOS ) 45 MG tablet [id number] Yes Take 45 mg by mouth daily. [provider] Taking Active Spouse/Significant Other  propranolol (INDERAL) 10 MG tablet [id number] Yes Take 10 mg by mouth daily. [provider] Taking Active   traMADol  (ULTRAM ) 50 MG tablet [id number] Yes Take 1-2 tablets (50-100 mg total) by mouth every 6 (six) hours as needed for moderate pain (If Vicodin alone ineffective). Edmisten, Roxie CROME, PA Taking Active   trolamine salicylate (ASPERCREME) 10 % cream [id number] Yes Apply 1 application topically as needed for muscle pain. [provider] Taking Active Spouse/Significant Other             Medication Assistance Findings:  Medication assistance needs identified: Farxiga and Ozempic     Additional medication assistance options reviewed with patient as warranted:  No other options identified  Plan: I will route patient assistance letter to pharmacy technician who will coordinate patient assistance program application process for medications listed above.  Pharmacy technician will assist with obtaining all required documents from both patient and provider(s) and submit application(s) once completed.    Thank you for allowing pharmacy to be a  part of this patient's care.     Cassius DOROTHA Brought, PharmD, BCACP [facility] Clinical Pharmacist [phone]

## 2023-05-08 DIAGNOSIS — M51362 Other intervertebral disc degeneration, lumbar region with discogenic back pain and lower extremity pain: Secondary | ICD-10-CM | POA: Diagnosis not present

## 2023-05-08 DIAGNOSIS — M9905 Segmental and somatic dysfunction of pelvic region: Secondary | ICD-10-CM | POA: Diagnosis not present

## 2023-05-08 DIAGNOSIS — M9902 Segmental and somatic dysfunction of thoracic region: Secondary | ICD-10-CM | POA: Diagnosis not present

## 2023-05-08 DIAGNOSIS — M9903 Segmental and somatic dysfunction of lumbar region: Secondary | ICD-10-CM | POA: Diagnosis not present

## 2023-05-09 DIAGNOSIS — M9902 Segmental and somatic dysfunction of thoracic region: Secondary | ICD-10-CM | POA: Diagnosis not present

## 2023-05-09 DIAGNOSIS — M9905 Segmental and somatic dysfunction of pelvic region: Secondary | ICD-10-CM | POA: Diagnosis not present

## 2023-05-09 DIAGNOSIS — M9903 Segmental and somatic dysfunction of lumbar region: Secondary | ICD-10-CM | POA: Diagnosis not present

## 2023-05-09 DIAGNOSIS — M51362 Other intervertebral disc degeneration, lumbar region with discogenic back pain and lower extremity pain: Secondary | ICD-10-CM | POA: Diagnosis not present

## 2023-05-13 ENCOUNTER — Telehealth: Payer: Self-pay | Admitting: Pharmacy Technician

## 2023-05-13 DIAGNOSIS — E11621 Type 2 diabetes mellitus with foot ulcer: Secondary | ICD-10-CM | POA: Diagnosis not present

## 2023-05-13 DIAGNOSIS — E1165 Type 2 diabetes mellitus with hyperglycemia: Secondary | ICD-10-CM | POA: Diagnosis not present

## 2023-05-13 DIAGNOSIS — M51362 Other intervertebral disc degeneration, lumbar region with discogenic back pain and lower extremity pain: Secondary | ICD-10-CM | POA: Diagnosis not present

## 2023-05-13 DIAGNOSIS — R300 Dysuria: Secondary | ICD-10-CM | POA: Diagnosis not present

## 2023-05-13 DIAGNOSIS — M9902 Segmental and somatic dysfunction of thoracic region: Secondary | ICD-10-CM | POA: Diagnosis not present

## 2023-05-13 DIAGNOSIS — Z5986 Financial insecurity: Secondary | ICD-10-CM

## 2023-05-13 DIAGNOSIS — M9905 Segmental and somatic dysfunction of pelvic region: Secondary | ICD-10-CM | POA: Diagnosis not present

## 2023-05-13 DIAGNOSIS — R350 Frequency of micturition: Secondary | ICD-10-CM | POA: Diagnosis not present

## 2023-05-13 DIAGNOSIS — E1142 Type 2 diabetes mellitus with diabetic polyneuropathy: Secondary | ICD-10-CM | POA: Diagnosis not present

## 2023-05-13 DIAGNOSIS — M9903 Segmental and somatic dysfunction of lumbar region: Secondary | ICD-10-CM | POA: Diagnosis not present

## 2023-05-13 DIAGNOSIS — N1831 Chronic kidney disease, stage 3a: Secondary | ICD-10-CM | POA: Diagnosis not present

## 2023-05-13 DIAGNOSIS — L97509 Non-pressure chronic ulcer of other part of unspecified foot with unspecified severity: Secondary | ICD-10-CM | POA: Diagnosis not present

## 2023-05-13 NOTE — Progress Notes (Signed)
 Pharmacy Medication Assistance Program Note    05/13/2023  Patient ID: Lance Hill, male   DOB: November 12, 1945, 78 y.o.   MRN: [id number]     05/13/2023  Outreach Medication One  Initial Outreach Date (Medication One) 05/09/2023  Manufacturer Medication One [employer] [facility] Ozempic  Dose of Ozempic 2mg /12ml  Type of Radiographer, Therapeutic Assistance  Date Application Sent to Patient 05/09/2023  Application Items Requested Application;Proof of Income;Other  Date Application Sent to Prescriber 05/09/2023  Name of Prescriber Delon Contes       05/13/2023  Outreach Medication Two  Initial Outreach Date (Medication Two) 05/09/2023  Manufacturer Medication Two [employer]  [employer] [facility] Farxiga  Dose of Farxiga 10mg   Type of Radiographer, Therapeutic Assistance  Date Application Sent to Patient 05/09/2023  Application Items Requested Application;Proof of Income;Other  Date Application Sent to Prescriber 05/09/2023  Name of Prescriber [facility]     Signature   Kate Caddy, CPhT [facility]  Office: [phone] Fax: [phone] Email: [email or web address]

## 2023-05-31 ENCOUNTER — Telehealth: Payer: Self-pay | Admitting: Pharmacist

## 2023-05-31 NOTE — Progress Notes (Signed)
   05/31/2023  Patient ID: Lance Hill, male   DOB: 1945-12-17, 78 y.o.   MRN: [id number]  Patient's wife Venita Sheffield) called. HIPAA identifiers were obtained.  She reported Mr. Capes was changed from [place] to [place] by his [employer] and that he can now get Rybelsus and/or Ozempic through the [employer].  We will close his Patient Assistance Case.   Beecher Mcardle, PharmD, BCACP Clinical Pharmacist [phone]   .

## 2023-06-11 DIAGNOSIS — R42 Dizziness and giddiness: Secondary | ICD-10-CM | POA: Diagnosis not present

## 2023-06-11 DIAGNOSIS — R059 Cough, unspecified: Secondary | ICD-10-CM | POA: Diagnosis not present

## 2023-06-14 DIAGNOSIS — Z0000 Encounter for general adult medical examination without abnormal findings: Secondary | ICD-10-CM | POA: Diagnosis not present

## 2023-06-14 DIAGNOSIS — J40 Bronchitis, not specified as acute or chronic: Secondary | ICD-10-CM | POA: Diagnosis not present

## 2023-06-14 DIAGNOSIS — E785 Hyperlipidemia, unspecified: Secondary | ICD-10-CM | POA: Diagnosis not present

## 2023-06-14 DIAGNOSIS — E1169 Type 2 diabetes mellitus with other specified complication: Secondary | ICD-10-CM | POA: Diagnosis not present

## 2023-06-14 DIAGNOSIS — R5383 Other fatigue: Secondary | ICD-10-CM | POA: Diagnosis not present

## 2023-06-14 DIAGNOSIS — J329 Chronic sinusitis, unspecified: Secondary | ICD-10-CM | POA: Diagnosis not present

## 2023-06-28 DIAGNOSIS — M25551 Pain in right hip: Secondary | ICD-10-CM | POA: Diagnosis not present

## 2023-06-28 DIAGNOSIS — Z96641 Presence of right artificial hip joint: Secondary | ICD-10-CM | POA: Diagnosis not present

## 2023-06-28 DIAGNOSIS — M76891 Other specified enthesopathies of right lower limb, excluding foot: Secondary | ICD-10-CM | POA: Diagnosis not present

## 2023-07-03 DIAGNOSIS — M47816 Spondylosis without myelopathy or radiculopathy, lumbar region: Secondary | ICD-10-CM | POA: Diagnosis not present

## 2023-07-20 DIAGNOSIS — M7061 Trochanteric bursitis, right hip: Secondary | ICD-10-CM | POA: Diagnosis not present

## 2023-07-20 DIAGNOSIS — S76011D Strain of muscle, fascia and tendon of right hip, subsequent encounter: Secondary | ICD-10-CM | POA: Diagnosis not present

## 2023-07-20 DIAGNOSIS — Z96641 Presence of right artificial hip joint: Secondary | ICD-10-CM | POA: Diagnosis not present

## 2023-07-20 DIAGNOSIS — M6788 Other specified disorders of synovium and tendon, other site: Secondary | ICD-10-CM | POA: Diagnosis not present

## 2023-07-20 DIAGNOSIS — S76211A Strain of adductor muscle, fascia and tendon of right thigh, initial encounter: Secondary | ICD-10-CM | POA: Diagnosis not present

## 2023-07-29 DIAGNOSIS — R2689 Other abnormalities of gait and mobility: Secondary | ICD-10-CM | POA: Diagnosis not present

## 2023-07-29 DIAGNOSIS — M7061 Trochanteric bursitis, right hip: Secondary | ICD-10-CM | POA: Diagnosis not present

## 2023-07-31 ENCOUNTER — Encounter: Payer: Self-pay | Admitting: Cardiology

## 2023-07-31 DIAGNOSIS — R2689 Other abnormalities of gait and mobility: Secondary | ICD-10-CM | POA: Diagnosis not present

## 2023-07-31 DIAGNOSIS — M7061 Trochanteric bursitis, right hip: Secondary | ICD-10-CM | POA: Diagnosis not present

## 2023-08-01 ENCOUNTER — Encounter: Payer: Self-pay | Admitting: Cardiology

## 2023-08-01 ENCOUNTER — Ambulatory Visit: Payer: Medicare Other | Attending: Cardiology | Admitting: Cardiology

## 2023-08-01 VITALS — BP 140/80 | HR 70 | Ht 68.0 in | Wt 172.6 lb

## 2023-08-01 DIAGNOSIS — I10 Essential (primary) hypertension: Secondary | ICD-10-CM | POA: Insufficient documentation

## 2023-08-01 DIAGNOSIS — E782 Mixed hyperlipidemia: Secondary | ICD-10-CM | POA: Insufficient documentation

## 2023-08-01 DIAGNOSIS — E1140 Type 2 diabetes mellitus with diabetic neuropathy, unspecified: Secondary | ICD-10-CM | POA: Insufficient documentation

## 2023-08-01 DIAGNOSIS — I779 Disorder of arteries and arterioles, unspecified: Secondary | ICD-10-CM | POA: Insufficient documentation

## 2023-08-01 DIAGNOSIS — R931 Abnormal findings on diagnostic imaging of heart and coronary circulation: Secondary | ICD-10-CM | POA: Insufficient documentation

## 2023-08-01 NOTE — Progress Notes (Signed)
 Cardiology Office Note:    Date:  08/01/2023   ID:  L[facility], DOB Jul 07, 1945, MRN [id number]  PCP:  Buckner Malta, MD  Cardiologist:  Gypsy Balsam, MD    Referring MD: Buckner Malta, MD   Chief Complaint  Patient presents with   Follow-up    History of Present Illness:    Lance Hill is a 78 y.o. male past medical history significant for longstanding diabetes, essential hypertension, dyslipidemia, elevated calcium score of 2200, stress test negative comes today for regular follow-up.  Doing well biggest complaint he have his joints issues he does he does have a problem with the right hip right knee.  Otherwise cardiac wise stable  Past Medical History:  Diagnosis Date   Arthritis    Combined form of senile cataract 07/22/2013   Formatting of this note might be different from the original. 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   Failed total hip arthroplasty, sequela 06/26/2017   Failed total hip arthroplasty, subsequent encounter 06/26/2017   GERD (gastroesophageal reflux disease)    Glaucoma 08/23/2020   History of kidney stones    hx of    Hyperlipidemia 08/23/2020   Hypertension    Irregular astigmatism 07/22/2013   Lumbar radicular pain 02/15/2016   Multilevel degenerative disc disease 08/23/2020   Ocular trauma 07/22/2013   Spinal stenosis of lumbar region 02/06/2017   Spinal stenosis, lumbar 02/06/2017   Status post cataract extraction and insertion of intraocular lens, right 10/09/2013   Trochanteric bursitis of right hip 02/17/2019   Type 2 diabetes mellitus with diabetic neuropathy, unspecified (HCC) 08/23/2020   Type II diabetes mellitus, well controlled (HCC) 02/17/2019    Past Surgical History:  Procedure Laterality Date   CHOLECYSTECTOMY     EYE  SURGERY     right eye surgery , bilateral cataract    JOINT REPLACEMENT     hip and knee replacements    LUMBAR LAMINECTOMY/DECOMPRESSION MICRODISCECTOMY Right 02/06/2017   Procedure: Microlumbar decompression L5-S1 right;  Surgeon: Jene Every, MD;  Location: [facility];  Service: Orthopedics;  Laterality: Right;  90 mins   TONSILLECTOMY     TOTAL HIP REVISION Right 06/26/2017   Procedure: Right hip femoral head revision and abductor tendon repair;  Surgeon: Ollen Gross, MD;  Location: [facility];  Service: Orthopedics;  Laterality: Right;   TOTAL HIP REVISION Right 08/25/2018   Procedure: Right acetabular revision;  Surgeon: Ollen Gross, MD;  Location: [facility];  Service: Orthopedics;  Laterality: Right;  to follow    Current Medications: Current Meds  Medication Sig   acetaminophen (TYLENOL) 325 MG tablet Take 650 mg by mouth every 6 (six) hours as needed for moderate pain.   amLODipine (NORVASC) 5 MG tablet Take 1 tablet (5 mg total) by mouth daily.   ARTIFICIAL TEAR OP Place 1 drop into both eyes daily as needed (for dry eyes).   aspirin EC 81 MG tablet Take 81 mg by mouth daily. Swallow whole.   atorvastatin (LIPITOR) 40 MG tablet Take 1 tablet (40 mg total) by mouth daily.   Brinzolamide-Brimonidine 1-0.2 % SUSP Place 1 drop into both eyes 3 (three) times daily. SIMBRINZA   Cholecalciferol ([employer] VITAMIN D3) 1000 units capsule Take 2,000 Units by mouth daily.   cyanocobalamin 1000 MCG tablet Take 1,000 mcg  by mouth daily.   DULoxetine (CYMBALTA) 30 MG capsule Take 60 mg by mouth daily.   empagliflozin (JARDIANCE) 10 MG TABS tablet Take 10 mg by mouth daily.   gabapentin (NEURONTIN) 300 MG capsule Take 600 mg by mouth 2 (two) times daily.    glipiZIDE (GLUCOTROL) 5 MG tablet Take 2.5 mg by mouth daily before breakfast.   loratadine (CLARITIN) 10 MG tablet Take 10 mg by mouth daily.   losartan (COZAAR) 100 MG tablet Take 100 mg by mouth daily.   Menthol, Topical Analgesic,  (BIOFREEZE) 4 % GEL Apply 1 application topically daily as needed (pain).   metFORMIN (GLUCOPHAGE) 500 MG tablet Take 500 mg by mouth 2 (two) times daily with a meal.    mirabegron ER (MYRBETRIQ) 25 MG TB24 tablet Take 25 mg by mouth daily.   Omega-3 Fatty Acids (FISH OIL) 1200 MG CAPS Take 1,200 mg by mouth at bedtime.   omeprazole (PRILOSEC) 20 MG capsule Take 20 mg by mouth daily.   OVER THE COUNTER MEDICATION Place 1 Dose under the tongue daily. Hemp Flower Extract 500 mg drops. 1/2 dropperful daily   oxyCODONE (OXY IR/ROXICODONE) 5 MG immediate release tablet Take 5 mg by mouth every 6 (six) hours as needed for moderate pain (pain score 4-6) or severe pain (pain score 7-10).   OZEMPIC, 0.25 OR 0.5 MG/DOSE, 2 MG/3ML SOPN Inject 0.25 mg into the skin once a week.   pioglitazone (ACTOS) 45 MG tablet Take 45 mg by mouth daily.   propranolol (INDERAL) 10 MG tablet Take 10 mg by mouth daily.   traMADol (ULTRAM) 50 MG tablet Take 1-2 tablets (50-100 mg total) by mouth every 6 (six) hours as needed for moderate pain (If Vicodin alone ineffective).   trolamine salicylate (ASPERCREME) 10 % cream Apply 1 application topically as needed for muscle pain.   [DISCONTINUED] dapagliflozin propanediol (FARXIGA) 5 MG TABS tablet Take 5 mg by mouth daily.     Allergies:   Lisinopril   Social History   Socioeconomic History   Marital status: Married    Spouse name: Not on file   Number of children: Not on file   Years of education: Not on file   Highest education level: Not on file  Occupational History   Not on file  Tobacco Use   Smoking status: Never   Smokeless tobacco: Never  Vaping Use   Vaping status: Never Used  Substance and Sexual Activity   Alcohol use: Yes    Comment: glass wine once a month   Drug use: No   Sexual activity: Not on file  Other Topics Concern   Not on file  Social History Narrative   Not on file   Social Drivers of Health   Financial Resource Strain: Not on  file  Food Insecurity: Not on file  Transportation Needs: Not on file  Physical Activity: Not on file  Stress: Not on file  Social Connections: Not on file     Family History: The patient's family history is not on file. ROS:   Please see the history of present illness.    All 14 point review of systems negative except as described per history of present illness  EKGs/Labs/Other Studies Reviewed:         Recent Labs: 04/21/2023: ALT 16; BUN 21; Creatinine, Ser 1.26; Hemoglobin 12.3; Platelets 172; Potassium 4.8; Sodium 137  Recent Lipid Panel    Component Value Date/Time   CHOL 128 04/12/2023 1145   TRIG 101  04/12/2023 1145   HDL 41 04/12/2023 1145   CHOLHDL 3.1 04/12/2023 1145   LDLCALC 68 04/12/2023 1145    Physical Exam:    VS:  BP (!) 140/80 (BP Location: Right Arm, Patient Position: Sitting)   Pulse 70   Ht 5\' 8"  (1.727 m)   Wt 172 lb 9.6 oz (78.3 kg)   SpO2 95%   BMI 26.24 kg/m     Wt Readings from Last 3 Encounters:  08/01/23 172 lb 9.6 oz (78.3 kg)  04/22/23 175 lb 6.4 oz (79.6 kg)  04/21/23 179 lb (81.2 kg)     GEN:  Well nourished, well developed in no acute distress HEENT: Normal NECK: No JVD; soft carotid bruit on the right LYMPHATICS: No lymphadenopathy CARDIAC: RRR, no murmurs, no rubs, no gallops RESPIRATORY:  Clear to auscultation without rales, wheezing or rhonchi  ABDOMEN: Soft, non-tender, non-distended MUSCULOSKELETAL:  No edema; No deformity  SKIN: Warm and dry LOWER EXTREMITIES: no swelling NEUROLOGIC:  Alert and oriented x 3 PSYCHIATRIC:  Normal affect   ASSESSMENT:    1. Carotid artery disease, unspecified laterality, unspecified type (HCC)   2. Elevated coronary artery calcium score more than 2000   3. Essential hypertension   4. Type 2 diabetes mellitus with diabetic neuropathy, without long-term current use of insulin (HCC)   5. Mixed hyperlipidemia    PLAN:    In order of problems listed above:  Coronary artery  disease stable from that point review of stress is negative no chest pain tightness squeezing pressure burning chest, key is risk factors modifications. Elevated calcium score risk factors modifications.  Now essential hypertension, blood pressure well-controlled continue present management. Dyslipidemia I did review K PN show me LDL 68 HDL 41 continue present management which include high intense statin for of Lipitor 40. Carotic bruit will schedule him to have carotic ultrasound   Medication Adjustments/Labs and Tests Ordered: Current medicines are reviewed at length with the patient today.  Concerns regarding medicines are outlined above.  Orders Placed This Encounter  Procedures   VAS US CAROTID   Medication changes: No orders of the defined types were placed in this encounter.   Signed, Georgeanna Lea, MD, [facility] 08/01/2023 12:05 PM    [facility]

## 2023-08-01 NOTE — Patient Instructions (Signed)
 Medication Instructions:  Your physician recommends that you continue on your current medications as directed. Please refer to the Current Medication list given to you today.  *If you need a refill on your cardiac medications before your next appointment, please call your pharmacy*   Lab Work: None Ordered If you have labs (blood work) drawn today and your tests are completely normal, you will receive your results only by: MyChart Message (if you have MyChart) OR A paper copy in the mail If you have any lab test that is abnormal or we need to change your treatment, we will call you to review the results.   Testing/Procedures: Your physician has requested that you have a carotid duplex. This test is an ultrasound of the carotid arteries in your neck. It looks at blood flow through these arteries that supply the brain with blood. Allow one hour for this exam. There are no restrictions or special instructions.    Follow-Up: At [facility], you and your health needs are our priority.  As part of our continuing mission to provide you with exceptional heart care, we have created designated Provider Care Teams.  These Care Teams include your primary Cardiologist (physician) and Advanced Practice Providers (APPs -  Physician Assistants and Nurse Practitioners) who all work together to provide you with the care you need, when you need it.  We recommend signing up for the patient portal called "MyChart".  Sign up information is provided on this After Visit Summary.  MyChart is used to connect with patients for Virtual Visits (Telemedicine).  Patients are able to view lab/test results, encounter notes, upcoming appointments, etc.  Non-urgent messages can be sent to your provider as well.   To learn more about what you can do with MyChart, go to [email or web address].    Your next appointment:   6 month(s)  The format for your next appointment:   In Person  Provider:   Gypsy Balsam, MD     Other Instructions NA

## 2023-08-05 DIAGNOSIS — M7061 Trochanteric bursitis, right hip: Secondary | ICD-10-CM | POA: Diagnosis not present

## 2023-08-05 DIAGNOSIS — R2689 Other abnormalities of gait and mobility: Secondary | ICD-10-CM | POA: Diagnosis not present

## 2023-08-07 DIAGNOSIS — R2689 Other abnormalities of gait and mobility: Secondary | ICD-10-CM | POA: Diagnosis not present

## 2023-08-07 DIAGNOSIS — M7061 Trochanteric bursitis, right hip: Secondary | ICD-10-CM | POA: Diagnosis not present

## 2023-08-12 ENCOUNTER — Encounter (HOSPITAL_BASED_OUTPATIENT_CLINIC_OR_DEPARTMENT_OTHER): Payer: Self-pay | Admitting: Emergency Medicine

## 2023-08-12 ENCOUNTER — Ambulatory Visit (HOSPITAL_BASED_OUTPATIENT_CLINIC_OR_DEPARTMENT_OTHER)
Admission: EM | Admit: 2023-08-12 | Discharge: 2023-08-12 | Disposition: A | Discharge status: Home / Self Care | Attending: Physician Assistant | Admitting: Physician Assistant

## 2023-08-12 DIAGNOSIS — L03221 Cellulitis of neck: Secondary | ICD-10-CM | POA: Diagnosis not present

## 2023-08-12 DIAGNOSIS — L0211 Cutaneous abscess of neck: Secondary | ICD-10-CM | POA: Diagnosis not present

## 2023-08-12 MED ORDER — MUPIROCIN 2 % EX OINT: 1.0000 | TOPICAL_OINTMENT | Freq: Two times a day (BID) | CUTANEOUS | 0 refills | Status: AC

## 2023-08-12 MED ORDER — DOXYCYCLINE HYCLATE 100 MG PO CAPS: 100.0000 mg | ORAL_CAPSULE | Freq: Two times a day (BID) | ORAL | 0 refills | Status: AC

## 2023-08-12 NOTE — ED Provider Notes (Signed)
 [facility]    CSN: [id number] Arrival date & time: 08/12/23  1312      History   Chief Complaint Chief Complaint  Patient presents with   Abscess    HPI Jamont Mellin is a 78 y.o. male.   Patient presents today with a 3-day history of painful lesion on his posterior neck.  He reports that pain is rated 7 on a 0-10 pain scale, described as aching, worse with palpation, no alleviating factors identified.  He has attempted to drain this area unsuccessfully.  He did have a similar lesion on his abdomen last year that resolved without intervention.  He denies history of chronic skin infections or MRSA.  Denies any recent hospitalization or surgical procedure.  He does have a history of diabetes but reports his blood sugars adequately controlled with his last fasting sugar 140 two days ago.  He has tried topical antibiotic ointment as well as Voltaren without improvement.  He has also tried Tylenol.  Denies any fever, nausea, vomiting.  He denies any headache, neck stiffness.  Denies any recent antibiotics in the past 90 days.    Past Medical History:  Diagnosis Date   Arthritis    Combined form of senile cataract 07/22/2013   Formatting of this note might be different from the original. 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   Failed total hip arthroplasty, sequela 06/26/2017   Failed total hip arthroplasty, subsequent encounter 06/26/2017   GERD (gastroesophageal reflux disease)    Glaucoma 08/23/2020   History of kidney stones    hx of    Hyperlipidemia 08/23/2020   Hypertension    Irregular astigmatism 07/22/2013   Lumbar radicular pain 02/15/2016   Multilevel degenerative disc disease 08/23/2020   Ocular trauma 07/22/2013   Spinal stenosis of lumbar region 02/06/2017    Spinal stenosis, lumbar 02/06/2017   Status post cataract extraction and insertion of intraocular lens, right 10/09/2013   Trochanteric bursitis of right hip 02/17/2019   Type 2 diabetes mellitus with diabetic neuropathy, unspecified (HCC) 08/23/2020   Type II diabetes mellitus, well controlled (HCC) 02/17/2019    Patient Active Problem List   Diagnosis Date Noted   Elevated coronary artery calcium score more than 2000 12/27/2022   Perceptive hearing loss, both sides 09/04/2022   Allergic rhinitis 09/04/2022   Disorder associated with well-controlled type 2 diabetes mellitus (HCC) 09/04/2022   Central opacity of cornea 09/04/2022   Encounter for fitting and adjustment of hearing aid 09/04/2022   Orthostatic hypotension 09/04/2022   Enlarged prostate 08/23/2020   Essential hypertension 08/23/2020   Hyperlipidemia 08/23/2020   Glaucoma 08/23/2020   Multilevel degenerative disc disease 08/23/2020   Type 2 diabetes mellitus with diabetic neuropathy, unspecified (HCC) 08/23/2020   Bilateral sensorineural hearing loss 08/23/2020   Trochanteric bursitis of right hip 02/17/2019   Type II diabetes mellitus, well controlled (HCC) 02/17/2019   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   Failed total hip arthroplasty, subsequent encounter 06/26/2017   Failed total hip arthroplasty, sequela 06/26/2017   Spinal stenosis of lumbar region 02/06/2017   Spinal stenosis, lumbar 02/06/2017   Lumbar radicular pain 02/15/2016   Corneal epithelial defect 10/09/2013   Status post cataract extraction and insertion of intraocular lens, right 10/09/2013   Combined form of senile  cataract 07/22/2013   Corneal laceration 07/22/2013   Irregular astigmatism 07/22/2013   Ocular trauma 07/22/2013   Tubular adenoma of colon 10/24/2005   Gastroesophageal reflux disease 04/30/2004    Past Surgical History:  Procedure Laterality Date   CHOLECYSTECTOMY     EYE SURGERY     right eye  surgery , bilateral cataract    JOINT REPLACEMENT     hip and knee replacements    LUMBAR LAMINECTOMY/DECOMPRESSION MICRODISCECTOMY Right 02/06/2017   Procedure: Microlumbar decompression L5-S1 right;  Surgeon: Jene Every, MD;  Location: [facility];  Service: Orthopedics;  Laterality: Right;  90 mins   TONSILLECTOMY     TOTAL HIP REVISION Right 06/26/2017   Procedure: Right hip femoral head revision and abductor tendon repair;  Surgeon: Ollen Gross, MD;  Location: [facility];  Service: Orthopedics;  Laterality: Right;   TOTAL HIP REVISION Right 08/25/2018   Procedure: Right acetabular revision;  Surgeon: Ollen Gross, MD;  Location: [facility];  Service: Orthopedics;  Laterality: Right;  to follow       Home Medications    Prior to Admission medications   Medication Sig Start Date End Date Taking? Authorizing Provider  doxycycline (VIBRAMYCIN) 100 MG capsule Take 1 capsule (100 mg total) by mouth 2 (two) times daily. 08/12/23  Yes Jaclyne Haverstick, Denny Peon K, PA-C  mupirocin ointment (BACTROBAN) 2 % Apply 1 Application topically 2 (two) times daily. 08/12/23  Yes Keldan Eplin, Noberto Retort, PA-C  acetaminophen (TYLENOL) 325 MG tablet Take 650 mg by mouth every 6 (six) hours as needed for moderate pain.    [provider]  amLODipine (NORVASC) 5 MG tablet Take 1 tablet (5 mg total) by mouth daily. 09/04/22   Georgeanna Lea, MD  ARTIFICIAL TEAR OP Place 1 drop into both eyes daily as needed (for dry eyes).    [provider]  aspirin EC 81 MG tablet Take 81 mg by mouth daily. Swallow whole.    [provider]  atorvastatin (LIPITOR) 40 MG tablet Take 1 tablet (40 mg total) by mouth daily. 02/20/23 07/31/23  Georgeanna Lea, MD  Brinzolamide-Brimonidine 1-0.2 % SUSP Place 1 drop into both eyes 3 (three) times daily. [facility]    [provider]  Cholecalciferol ([employer] VITAMIN D3) 1000 units capsule Take 2,000 Units by mouth daily.    [provider]  cyanocobalamin  1000 MCG tablet Take 1,000 mcg by mouth daily.    [provider]  DULoxetine (CYMBALTA) 30 MG capsule Take 60 mg by mouth daily.    [provider]  empagliflozin (JARDIANCE) 10 MG TABS tablet Take 10 mg by mouth daily.    [provider]  gabapentin (NEURONTIN) 300 MG capsule Take 600 mg by mouth 2 (two) times daily.     [provider]  glipiZIDE (GLUCOTROL) 5 MG tablet Take 2.5 mg by mouth daily before breakfast.    [provider]  loratadine (CLARITIN) 10 MG tablet Take 10 mg by mouth daily.    [provider]  losartan (COZAAR) 100 MG tablet Take 100 mg by mouth daily.    [provider]  Menthol, Topical Analgesic, (BIOFREEZE) 4 % GEL Apply 1 application topically daily as needed (pain).    [provider]  metFORMIN (GLUCOPHAGE) 500 MG tablet Take 500 mg by mouth 2 (two) times daily with a meal.     [provider]  mirabegron ER (MYRBETRIQ) 25 MG TB24 tablet Take 25 mg by mouth daily.  [provider]  Omega-3 Fatty Acids (FISH OIL) 1200 MG CAPS Take 1,200 mg by mouth at bedtime.    [provider]  omeprazole (PRILOSEC) 20 MG capsule Take 20 mg by mouth daily.    [provider]  OVER THE COUNTER MEDICATION Place 1 Dose under the tongue daily. Hemp Flower Extract 500 mg drops. 1/2 dropperful daily    [provider]  oxyCODONE (OXY IR/ROXICODONE) 5 MG immediate release tablet Take 5 mg by mouth every 6 (six) hours as needed for moderate pain (pain score 4-6) or severe pain (pain score 7-10). 04/20/23   [provider]  OZEMPIC, 0.25 OR 0.5 MG/DOSE, 2 MG/3ML SOPN Inject 0.25 mg into the skin once a week. 02/02/23   [provider]  pioglitazone (ACTOS) 45 MG tablet Take 45 mg by mouth daily.    [provider]  propranolol (INDERAL) 10 MG tablet Take 10 mg by mouth daily.    [provider]  traMADol (ULTRAM) 50 MG tablet Take 1-2 tablets  (50-100 mg total) by mouth every 6 (six) hours as needed for moderate pain (If Vicodin alone ineffective). 08/26/18   Edmisten, Kristie L, PA  trolamine salicylate (ASPERCREME) 10 % cream Apply 1 application topically as needed for muscle pain.    [provider]    Family History History reviewed. No pertinent family history.  Social History Social History   Tobacco Use   Smoking status: Never   Smokeless tobacco: Never  Vaping Use   Vaping status: Never Used  Substance Use Topics   Alcohol use: Yes    Comment: glass wine once a month   Drug use: No     Allergies   Lisinopril   Review of Systems Review of Systems  Constitutional:  Positive for activity change. Negative for appetite change, fatigue and fever.  Gastrointestinal:  Negative for abdominal pain, diarrhea, nausea and vomiting.  Musculoskeletal:  Negative for arthralgias, myalgias, neck pain and neck stiffness.  Skin:  Positive for color change and wound.  Neurological:  Negative for dizziness, weakness, light-headedness, numbness and headaches.     Physical Exam Triage Vital Signs ED Triage Vitals  Encounter Vitals Group     BP 08/12/23 1325 (!) 136/55     Systolic BP Percentile --      Diastolic BP Percentile --      Pulse Rate 08/12/23 1325 98     Resp 08/12/23 1325 17     Temp 08/12/23 1325 98 F (36.7 C)     Temp Source 08/12/23 1325 Oral     SpO2 08/12/23 1325 97 %     Weight --      Height --      Head Circumference --      Peak Flow --      Pain Score 08/12/23 1322 7     Pain Loc --      Pain Education --      Exclude from Growth Chart --    No data found.  Updated Vital Signs BP (!) 136/55 (BP Location: Right Arm)   Pulse 98   Temp 98 F (36.7 C) (Oral)   Resp 17   SpO2 97%   Visual Acuity Right Eye Distance:   Left Eye Distance:   Bilateral Distance:    Right Eye Near:   Left Eye Near:    Bilateral Near:     Physical Exam Vitals reviewed.  Constitutional:       General: He  is awake.     Appearance: Normal appearance. He is well-developed. He is not ill-appearing.     Comments: Very pleasant male appears stated age no acute distress sitting comfortably in exam room  HENT:     Head: Normocephalic and atraumatic.     Mouth/Throat:     Pharynx: Uvula midline. No oropharyngeal exudate or posterior oropharyngeal erythema.  Cardiovascular:     Rate and Rhythm: Normal rate and regular rhythm.     Heart sounds: Normal heart sounds, S1 normal and S2 normal. No murmur heard. Pulmonary:     Effort: Pulmonary effort is normal.     Breath sounds: Normal breath sounds. No stridor. No wheezing, rhonchi or rales.     Comments: Clear to auscultation bilaterally Musculoskeletal:     Cervical back: Normal range of motion and neck supple.  Skin:    Findings: Erythema and wound present.     Comments: 1 cm eschar noted posterior neck with surrounding erythema measuring 4 cm x 2 and half centimeters.  Associated induration without fluctuance.  No active bleeding or drainage noted.  Neurological:     Mental Status: He is alert.  Psychiatric:        Behavior: Behavior is cooperative.      UC Treatments / Results  Labs (all labs ordered are listed, but only abnormal results are displayed) Labs Reviewed  CBC WITH DIFFERENTIAL/PLATELET  BASIC METABOLIC PANEL WITH GFR    EKG   Radiology No results found.  Procedures Procedures (including critical care time)  Medications Ordered in UC Medications - No data to display  Initial Impression / Assessment and Plan / UC Course  I have reviewed the triage vital signs and the nursing notes.  Pertinent labs & imaging results that were available during my care of the patient were reviewed by me and considered in my medical decision making (see chart for details).     Patient is well-appearing, afebrile, nontoxic, nontachycardic.  I&D was not attempted as he had no fluctuance or identifiable fluid collection on  exam.  Will treat for cellulitis with doxycycline 100 mg twice daily for 10 days.  We discussed that he is to avoid prolonged sun exposure while on this medication due to associated photosensitivity.  He is to keep the area clean with soap and water and apply Bactroban ointment twice daily with dressing changes.  CBC and BMP obtained today and pending.  If he has significant leukocytosis or abnormal kidney function he would need to go to the ER.  He is to demarcate the area of erythema and if this continues to spread despite antibiotics or spreads rapidly he needs to go to the ER.  Recommended he follow-up with his primary care within 1 to 2 days to ensure symptom improvement with antibiotics.  If anything worsens and he has redness, fever, nausea, vomiting, increasing pain, neck stiffness, headache he needs to be seen emergently.  Strict return precautions given.  All questions answered to his and wife satisfaction.  Final Clinical Impressions(s) / UC Diagnoses   Final diagnoses:  Cellulitis and abscess of neck     Discharge Instructions      Start doxycycline 100 mg twice daily for 10 days.  Stay out of the sun while on this medication as it can cause you to have a bad sunburn.  Keep the area clean with soap and water and apply Bactroban ointment with dressing changes.  Draw a line around the area of redness.  If this  spreads significantly despite the antibiotics you need to go to the ER.  Follow-up with your primary care to ensure that this improves.  If your lab work is abnormal I will contact you.  If anything worsens and you have rapid spread of redness, increasing pain, difficulty moving your neck, headache, fever, nausea, vomiting you need to go to the ER.     ED Prescriptions     Medication Sig Dispense Auth. Provider   doxycycline (VIBRAMYCIN) 100 MG capsule Take 1 capsule (100 mg total) by mouth 2 (two) times daily. 20 capsule Alen Matheson K, PA-C   mupirocin ointment (BACTROBAN) 2 %  Apply 1 Application topically 2 (two) times daily. 22 g Jayvan Mcshan K, PA-C      PDMP not reviewed this encounter.   Budd Cargo, PA-C 08/12/23 1416

## 2023-08-12 NOTE — Discharge Instructions (Signed)
 Start doxycycline 100 mg twice daily for 10 days.  Stay out of the sun while on this medication as it can cause you to have a bad sunburn.  Keep the area clean with soap and water and apply Bactroban ointment with dressing changes.  Draw a line around the area of redness.  If this spreads significantly despite the antibiotics you need to go to the ER.  Follow-up with your primary care to ensure that this improves.  If your lab work is abnormal I will contact you.  If anything worsens and you have rapid spread of redness, increasing pain, difficulty moving your neck, headache, fever, nausea, vomiting you need to go to the ER.

## 2023-08-12 NOTE — ED Triage Notes (Signed)
 Pt reports Friday had knot came up on upper neck. Reports painful. Denies drainage. Had antibiotic and pain relief ointment and tylenol.

## 2023-08-13 LAB — BASIC METABOLIC PANEL WITH GFR
BUN/Creatinine Ratio: 17 (ref 10–24)
BUN: 24 mg/dL (ref 8–27)
CO2: 22 mmol/L (ref 20–29)
Calcium: 9 mg/dL (ref 8.6–10.2)
Chloride: 99 mmol/L (ref 96–106)
Creatinine, Ser: 1.43 mg/dL — ABNORMAL HIGH (ref 0.76–1.27)
Glucose: 430 mg/dL — ABNORMAL HIGH (ref 70–99)
Potassium: 4.8 mmol/L (ref 3.5–5.2)
Sodium: 134 mmol/L (ref 134–144)
eGFR: 50 mL/min/{1.73_m2} — ABNORMAL LOW (ref >59)

## 2023-08-13 LAB — CBC WITH DIFFERENTIAL/PLATELET
Basophils Absolute: 0 10*3/uL (ref 0.0–0.2)
Basos: 0 %
EOS (ABSOLUTE): 0.1 10*3/uL (ref 0.0–0.4)
Eos: 1 %
Hematocrit: 42.3 % (ref 37.5–51.0)
Hemoglobin: 13 g/dL (ref 13.0–17.7)
Immature Grans (Abs): 0 10*3/uL (ref 0.0–0.1)
Immature Granulocytes: 0 %
Lymphocytes Absolute: 0.9 10*3/uL (ref 0.7–3.1)
Lymphs: 9 %
MCH: 29 pg (ref 26.6–33.0)
MCHC: 30.7 g/dL — ABNORMAL LOW (ref 31.5–35.7)
MCV: 94 fL (ref 79–97)
Monocytes Absolute: 1.1 10*3/uL — ABNORMAL HIGH (ref 0.1–0.9)
Monocytes: 11 %
Neutrophils Absolute: 7.5 10*3/uL — ABNORMAL HIGH (ref 1.4–7.0)
Neutrophils: 79 %
Platelets: 181 10*3/uL (ref 150–450)
RBC: 4.48 x10E6/uL (ref 4.14–5.80)
RDW: 13.7 % (ref 11.6–15.4)
WBC: 9.6 10*3/uL (ref 3.4–10.8)

## 2023-08-14 DIAGNOSIS — L0213 Carbuncle of neck: Secondary | ICD-10-CM | POA: Diagnosis present

## 2023-08-14 DIAGNOSIS — L03221 Cellulitis of neck: Secondary | ICD-10-CM | POA: Diagnosis not present

## 2023-08-14 DIAGNOSIS — Z79899 Other long term (current) drug therapy: Secondary | ICD-10-CM | POA: Diagnosis not present

## 2023-08-14 DIAGNOSIS — E1165 Type 2 diabetes mellitus with hyperglycemia: Secondary | ICD-10-CM | POA: Diagnosis not present

## 2023-08-14 DIAGNOSIS — N179 Acute kidney failure, unspecified: Secondary | ICD-10-CM | POA: Diagnosis present

## 2023-08-14 DIAGNOSIS — Z794 Long term (current) use of insulin: Secondary | ICD-10-CM | POA: Diagnosis not present

## 2023-08-14 DIAGNOSIS — E119 Type 2 diabetes mellitus without complications: Secondary | ICD-10-CM | POA: Diagnosis present

## 2023-08-14 DIAGNOSIS — B9562 Methicillin resistant Staphylococcus aureus infection as the cause of diseases classified elsewhere: Secondary | ICD-10-CM | POA: Diagnosis present

## 2023-08-14 DIAGNOSIS — M1990 Unspecified osteoarthritis, unspecified site: Secondary | ICD-10-CM | POA: Diagnosis present

## 2023-08-14 DIAGNOSIS — L0212 Furuncle of neck: Secondary | ICD-10-CM | POA: Diagnosis not present

## 2023-08-14 DIAGNOSIS — Z87442 Personal history of urinary calculi: Secondary | ICD-10-CM | POA: Diagnosis not present

## 2023-08-14 DIAGNOSIS — R221 Localized swelling, mass and lump, neck: Secondary | ICD-10-CM | POA: Diagnosis not present

## 2023-08-14 DIAGNOSIS — Z7984 Long term (current) use of oral hypoglycemic drugs: Secondary | ICD-10-CM | POA: Diagnosis not present

## 2023-08-14 DIAGNOSIS — Z7982 Long term (current) use of aspirin: Secondary | ICD-10-CM | POA: Diagnosis not present

## 2023-08-14 DIAGNOSIS — L0211 Cutaneous abscess of neck: Secondary | ICD-10-CM | POA: Diagnosis not present

## 2023-08-14 DIAGNOSIS — I10 Essential (primary) hypertension: Secondary | ICD-10-CM | POA: Diagnosis present

## 2023-08-15 DIAGNOSIS — L03221 Cellulitis of neck: Secondary | ICD-10-CM | POA: Diagnosis not present

## 2023-08-21 DIAGNOSIS — I77819 Aortic ectasia, unspecified site: Secondary | ICD-10-CM | POA: Diagnosis not present

## 2023-08-21 DIAGNOSIS — H902 Conductive hearing loss, unspecified: Secondary | ICD-10-CM | POA: Diagnosis not present

## 2023-08-21 DIAGNOSIS — Z96651 Presence of right artificial knee joint: Secondary | ICD-10-CM | POA: Diagnosis not present

## 2023-08-21 DIAGNOSIS — L0212 Furuncle of neck: Secondary | ICD-10-CM | POA: Diagnosis not present

## 2023-08-21 DIAGNOSIS — I700 Atherosclerosis of aorta: Secondary | ICD-10-CM | POA: Diagnosis not present

## 2023-08-21 DIAGNOSIS — M1990 Unspecified osteoarthritis, unspecified site: Secondary | ICD-10-CM | POA: Diagnosis not present

## 2023-08-21 DIAGNOSIS — N529 Male erectile dysfunction, unspecified: Secondary | ICD-10-CM | POA: Diagnosis not present

## 2023-08-21 DIAGNOSIS — M51369 Other intervertebral disc degeneration, lumbar region without mention of lumbar back pain or lower extremity pain: Secondary | ICD-10-CM | POA: Diagnosis not present

## 2023-08-21 DIAGNOSIS — I083 Combined rheumatic disorders of mitral, aortic and tricuspid valves: Secondary | ICD-10-CM | POA: Diagnosis not present

## 2023-08-21 DIAGNOSIS — Z96641 Presence of right artificial hip joint: Secondary | ICD-10-CM | POA: Diagnosis not present

## 2023-08-21 DIAGNOSIS — G250 Essential tremor: Secondary | ICD-10-CM | POA: Diagnosis not present

## 2023-08-21 DIAGNOSIS — B9562 Methicillin resistant Staphylococcus aureus infection as the cause of diseases classified elsewhere: Secondary | ICD-10-CM | POA: Diagnosis not present

## 2023-08-21 DIAGNOSIS — Z7984 Long term (current) use of oral hypoglycemic drugs: Secondary | ICD-10-CM | POA: Diagnosis not present

## 2023-08-21 DIAGNOSIS — L0213 Carbuncle of neck: Secondary | ICD-10-CM | POA: Diagnosis not present

## 2023-08-21 DIAGNOSIS — I10 Essential (primary) hypertension: Secondary | ICD-10-CM | POA: Diagnosis not present

## 2023-08-21 DIAGNOSIS — Z7982 Long term (current) use of aspirin: Secondary | ICD-10-CM | POA: Diagnosis not present

## 2023-08-21 DIAGNOSIS — E1165 Type 2 diabetes mellitus with hyperglycemia: Secondary | ICD-10-CM | POA: Diagnosis not present

## 2023-08-21 DIAGNOSIS — Z48817 Encounter for surgical aftercare following surgery on the skin and subcutaneous tissue: Secondary | ICD-10-CM | POA: Diagnosis not present

## 2023-08-21 DIAGNOSIS — L0211 Cutaneous abscess of neck: Secondary | ICD-10-CM | POA: Diagnosis not present

## 2023-08-21 DIAGNOSIS — L03221 Cellulitis of neck: Secondary | ICD-10-CM | POA: Diagnosis not present

## 2023-08-22 DIAGNOSIS — I700 Atherosclerosis of aorta: Secondary | ICD-10-CM | POA: Diagnosis not present

## 2023-08-22 DIAGNOSIS — Z48817 Encounter for surgical aftercare following surgery on the skin and subcutaneous tissue: Secondary | ICD-10-CM | POA: Diagnosis not present

## 2023-08-22 DIAGNOSIS — L0211 Cutaneous abscess of neck: Secondary | ICD-10-CM | POA: Diagnosis not present

## 2023-08-22 DIAGNOSIS — L03221 Cellulitis of neck: Secondary | ICD-10-CM | POA: Diagnosis not present

## 2023-08-22 DIAGNOSIS — E1165 Type 2 diabetes mellitus with hyperglycemia: Secondary | ICD-10-CM | POA: Diagnosis not present

## 2023-08-22 DIAGNOSIS — I77819 Aortic ectasia, unspecified site: Secondary | ICD-10-CM | POA: Diagnosis not present

## 2023-08-23 DIAGNOSIS — E1165 Type 2 diabetes mellitus with hyperglycemia: Secondary | ICD-10-CM | POA: Diagnosis not present

## 2023-08-23 DIAGNOSIS — Z48817 Encounter for surgical aftercare following surgery on the skin and subcutaneous tissue: Secondary | ICD-10-CM | POA: Diagnosis not present

## 2023-08-23 DIAGNOSIS — I77819 Aortic ectasia, unspecified site: Secondary | ICD-10-CM | POA: Diagnosis not present

## 2023-08-23 DIAGNOSIS — L0211 Cutaneous abscess of neck: Secondary | ICD-10-CM | POA: Diagnosis not present

## 2023-08-23 DIAGNOSIS — L03221 Cellulitis of neck: Secondary | ICD-10-CM | POA: Diagnosis not present

## 2023-08-23 DIAGNOSIS — I700 Atherosclerosis of aorta: Secondary | ICD-10-CM | POA: Diagnosis not present

## 2023-08-27 DIAGNOSIS — L0211 Cutaneous abscess of neck: Secondary | ICD-10-CM | POA: Diagnosis not present

## 2023-08-27 DIAGNOSIS — L03221 Cellulitis of neck: Secondary | ICD-10-CM | POA: Diagnosis not present

## 2023-08-27 DIAGNOSIS — I700 Atherosclerosis of aorta: Secondary | ICD-10-CM | POA: Diagnosis not present

## 2023-08-27 DIAGNOSIS — Z48817 Encounter for surgical aftercare following surgery on the skin and subcutaneous tissue: Secondary | ICD-10-CM | POA: Diagnosis not present

## 2023-08-27 DIAGNOSIS — E1165 Type 2 diabetes mellitus with hyperglycemia: Secondary | ICD-10-CM | POA: Diagnosis not present

## 2023-08-27 DIAGNOSIS — I77819 Aortic ectasia, unspecified site: Secondary | ICD-10-CM | POA: Diagnosis not present

## 2023-08-28 ENCOUNTER — Ambulatory Visit: Attending: Cardiology

## 2023-08-28 DIAGNOSIS — R0989 Other specified symptoms and signs involving the circulatory and respiratory systems: Secondary | ICD-10-CM | POA: Diagnosis not present

## 2023-08-28 DIAGNOSIS — I779 Disorder of arteries and arterioles, unspecified: Secondary | ICD-10-CM | POA: Insufficient documentation

## 2023-08-29 DIAGNOSIS — N179 Acute kidney failure, unspecified: Secondary | ICD-10-CM | POA: Diagnosis not present

## 2023-08-29 DIAGNOSIS — E1165 Type 2 diabetes mellitus with hyperglycemia: Secondary | ICD-10-CM | POA: Diagnosis not present

## 2023-08-29 DIAGNOSIS — N1831 Chronic kidney disease, stage 3a: Secondary | ICD-10-CM | POA: Diagnosis not present

## 2023-08-29 DIAGNOSIS — Z7689 Persons encountering health services in other specified circumstances: Secondary | ICD-10-CM | POA: Diagnosis not present

## 2023-09-02 DIAGNOSIS — L0211 Cutaneous abscess of neck: Secondary | ICD-10-CM | POA: Diagnosis not present

## 2023-09-04 ENCOUNTER — Telehealth: Payer: Self-pay

## 2023-09-04 DIAGNOSIS — I77819 Aortic ectasia, unspecified site: Secondary | ICD-10-CM | POA: Diagnosis not present

## 2023-09-04 DIAGNOSIS — E1165 Type 2 diabetes mellitus with hyperglycemia: Secondary | ICD-10-CM | POA: Diagnosis not present

## 2023-09-04 DIAGNOSIS — L03221 Cellulitis of neck: Secondary | ICD-10-CM | POA: Diagnosis not present

## 2023-09-04 DIAGNOSIS — I700 Atherosclerosis of aorta: Secondary | ICD-10-CM | POA: Diagnosis not present

## 2023-09-04 DIAGNOSIS — Z48817 Encounter for surgical aftercare following surgery on the skin and subcutaneous tissue: Secondary | ICD-10-CM | POA: Diagnosis not present

## 2023-09-04 DIAGNOSIS — L0211 Cutaneous abscess of neck: Secondary | ICD-10-CM | POA: Diagnosis not present

## 2023-09-04 NOTE — Telephone Encounter (Signed)
 Left message on My Chart with [place] results per Dr. Vanetta Shawl note. Routed to PCP.

## 2023-09-05 ENCOUNTER — Telehealth: Payer: Self-pay

## 2023-09-05 NOTE — Telephone Encounter (Signed)
 Pt viewed [place] results on My Chart per Dr. Vanetta Shawl note. Routed to PCP.

## 2023-09-11 DIAGNOSIS — N3941 Urge incontinence: Secondary | ICD-10-CM | POA: Diagnosis not present

## 2023-09-12 DIAGNOSIS — L03221 Cellulitis of neck: Secondary | ICD-10-CM | POA: Diagnosis not present

## 2023-09-12 DIAGNOSIS — I77819 Aortic ectasia, unspecified site: Secondary | ICD-10-CM | POA: Diagnosis not present

## 2023-09-12 DIAGNOSIS — I700 Atherosclerosis of aorta: Secondary | ICD-10-CM | POA: Diagnosis not present

## 2023-09-12 DIAGNOSIS — E1165 Type 2 diabetes mellitus with hyperglycemia: Secondary | ICD-10-CM | POA: Diagnosis not present

## 2023-09-12 DIAGNOSIS — Z48817 Encounter for surgical aftercare following surgery on the skin and subcutaneous tissue: Secondary | ICD-10-CM | POA: Diagnosis not present

## 2023-09-12 DIAGNOSIS — L0211 Cutaneous abscess of neck: Secondary | ICD-10-CM | POA: Diagnosis not present

## 2023-09-18 DIAGNOSIS — M47816 Spondylosis without myelopathy or radiculopathy, lumbar region: Secondary | ICD-10-CM | POA: Diagnosis not present

## 2023-09-19 DIAGNOSIS — L0211 Cutaneous abscess of neck: Secondary | ICD-10-CM | POA: Diagnosis not present

## 2023-09-19 DIAGNOSIS — Z48817 Encounter for surgical aftercare following surgery on the skin and subcutaneous tissue: Secondary | ICD-10-CM | POA: Diagnosis not present

## 2023-09-19 DIAGNOSIS — I77819 Aortic ectasia, unspecified site: Secondary | ICD-10-CM | POA: Diagnosis not present

## 2023-09-19 DIAGNOSIS — I700 Atherosclerosis of aorta: Secondary | ICD-10-CM | POA: Diagnosis not present

## 2023-09-19 DIAGNOSIS — E1165 Type 2 diabetes mellitus with hyperglycemia: Secondary | ICD-10-CM | POA: Diagnosis not present

## 2023-09-19 DIAGNOSIS — L03221 Cellulitis of neck: Secondary | ICD-10-CM | POA: Diagnosis not present

## 2023-09-24 DIAGNOSIS — Z6826 Body mass index (BMI) 26.0-26.9, adult: Secondary | ICD-10-CM | POA: Diagnosis not present

## 2023-09-24 DIAGNOSIS — N1831 Chronic kidney disease, stage 3a: Secondary | ICD-10-CM | POA: Diagnosis not present

## 2023-09-24 DIAGNOSIS — L0211 Cutaneous abscess of neck: Secondary | ICD-10-CM | POA: Diagnosis not present

## 2023-09-24 DIAGNOSIS — E1165 Type 2 diabetes mellitus with hyperglycemia: Secondary | ICD-10-CM | POA: Diagnosis not present

## 2023-10-03 DIAGNOSIS — M47816 Spondylosis without myelopathy or radiculopathy, lumbar region: Secondary | ICD-10-CM | POA: Diagnosis not present

## 2023-10-07 ENCOUNTER — Other Ambulatory Visit: Payer: Self-pay | Admitting: Cardiology

## 2023-10-07 DIAGNOSIS — L723 Sebaceous cyst: Secondary | ICD-10-CM | POA: Diagnosis not present

## 2023-10-27 DIAGNOSIS — R4182 Altered mental status, unspecified: Secondary | ICD-10-CM | POA: Diagnosis not present

## 2023-10-27 DIAGNOSIS — Z79899 Other long term (current) drug therapy: Secondary | ICD-10-CM | POA: Diagnosis not present

## 2023-10-27 DIAGNOSIS — I440 Atrioventricular block, first degree: Secondary | ICD-10-CM | POA: Diagnosis not present

## 2023-10-27 DIAGNOSIS — Z794 Long term (current) use of insulin: Secondary | ICD-10-CM | POA: Diagnosis not present

## 2023-11-26 ENCOUNTER — Other Ambulatory Visit: Payer: Self-pay | Admitting: Cardiology

## 2023-11-27 DIAGNOSIS — E1169 Type 2 diabetes mellitus with other specified complication: Secondary | ICD-10-CM | POA: Diagnosis not present

## 2023-11-27 DIAGNOSIS — E785 Hyperlipidemia, unspecified: Secondary | ICD-10-CM | POA: Diagnosis not present

## 2023-11-27 DIAGNOSIS — E119 Type 2 diabetes mellitus without complications: Secondary | ICD-10-CM | POA: Diagnosis not present

## 2023-11-27 DIAGNOSIS — E1165 Type 2 diabetes mellitus with hyperglycemia: Secondary | ICD-10-CM | POA: Diagnosis not present

## 2024-01-06 DIAGNOSIS — M47816 Spondylosis without myelopathy or radiculopathy, lumbar region: Secondary | ICD-10-CM | POA: Diagnosis not present

## 2024-01-24 ENCOUNTER — Ambulatory Visit: Attending: Cardiology | Admitting: Cardiology

## 2024-01-24 ENCOUNTER — Encounter: Payer: Self-pay | Admitting: Cardiology

## 2024-01-24 VITALS — BP 106/66 | HR 76 | Ht 69.0 in | Wt 201.4 lb

## 2024-01-24 DIAGNOSIS — R0609 Other forms of dyspnea: Secondary | ICD-10-CM | POA: Diagnosis not present

## 2024-01-24 DIAGNOSIS — E1140 Type 2 diabetes mellitus with diabetic neuropathy, unspecified: Secondary | ICD-10-CM | POA: Insufficient documentation

## 2024-01-24 DIAGNOSIS — R931 Abnormal findings on diagnostic imaging of heart and coronary circulation: Secondary | ICD-10-CM | POA: Insufficient documentation

## 2024-01-24 DIAGNOSIS — E782 Mixed hyperlipidemia: Secondary | ICD-10-CM | POA: Insufficient documentation

## 2024-01-24 NOTE — Addendum Note (Signed)
 Addended by: ARLOA PLANAS D on: 01/24/2024 02:09 PM   Modules accepted: Orders

## 2024-01-24 NOTE — Progress Notes (Signed)
 Cardiology Office Note:    Date:  01/24/2024   ID:  Lance Hill, DOB Apr 29, 1946, MRN [id number]  PCP:  Clemmie Nest, MD  Cardiologist:  Lamar Fitch, MD    Referring MD: Clemmie Nest, MD   No chief complaint on file. Doing great  History of Present Illness:    Lance Hill is a 78 y.o. male past medical history significant for diabetes, elevated calcium  score, essential hypertension, dyslipidemia.  Comes today for follow-up overall he is doing great.  He is asymptomatic, he can walk climb stairs with no difficulties.  Interestingly EKG today showed new changes suggesting possibility of anterior wall myocardial infarction he denies having any recent issues.  He played golf on the regular basis he played yesterday he gets a little tired but no chest pain tightness squeezing pressure burning chest  Past Medical History:  Diagnosis Date   Arthritis    Combined form of senile cataract 07/22/2013   Formatting of this note might be different from the original. OD>OS   Corneal epithelial defect 10/09/2013   Corneal laceration 07/22/2013   Diabetes mellitus without complication (HCC)    type II    Enlarged prostate 08/23/2020   Failed total hip arthroplasty with dislocation 08/25/2018   Failed total hip arthroplasty, sequela 06/26/2017   Failed total hip arthroplasty, subsequent encounter 06/26/2017   GERD (gastroesophageal reflux disease)    Glaucoma 08/23/2020   History of kidney stones    hx of    Hyperlipidemia 08/23/2020   Hypertension    Irregular astigmatism 07/22/2013   Lumbar radicular pain 02/15/2016   Multilevel degenerative disc disease 08/23/2020   Ocular trauma 07/22/2013   Spinal stenosis of lumbar region 02/06/2017   Spinal stenosis, lumbar 02/06/2017   Status post cataract extraction and insertion of intraocular lens, right 10/09/2013   Trochanteric bursitis of right hip 02/17/2019   Type 2 diabetes mellitus with diabetic neuropathy, unspecified (HCC)  08/23/2020   Type II diabetes mellitus, well controlled (HCC) 02/17/2019    Past Surgical History:  Procedure Laterality Date   CHOLECYSTECTOMY     EYE SURGERY     right eye surgery , bilateral cataract    JOINT REPLACEMENT     hip and knee replacements    LUMBAR LAMINECTOMY/DECOMPRESSION MICRODISCECTOMY Right 02/06/2017   Procedure: Microlumbar decompression L5-S1 right;  Surgeon: Duwayne Purchase, MD;  Location: [facility];  Service: Orthopedics;  Laterality: Right;  90 mins   TONSILLECTOMY     TOTAL HIP REVISION Right 06/26/2017   Procedure: Right hip femoral head revision and abductor tendon repair;  Surgeon: Melodi Lerner, MD;  Location: [facility];  Service: Orthopedics;  Laterality: Right;   TOTAL HIP REVISION Right 08/25/2018   Procedure: Right acetabular revision;  Surgeon: Melodi Lerner, MD;  Location: [facility];  Service: Orthopedics;  Laterality: Right;  to follow    Current Medications: Current Meds  Medication Sig   acetaminophen  (TYLENOL ) 325 MG tablet Take 650 mg by mouth every 6 (six) hours as needed for moderate pain.   amLODipine  (NORVASC ) 5 MG tablet Take 1 tablet (5 mg total) by mouth daily.   ARTIFICIAL TEAR OP Place 1 drop into both eyes daily as needed (for dry eyes).   aspirin  EC 81 MG tablet Take 81 mg by mouth daily. Swallow whole.   atorvastatin  (LIPITOR) 40 MG tablet TAKE 1 TABLET BY MOUTH EVERY DAY   Cholecalciferol ([employer] VITAMIN D3) 1000 units capsule Take 2,000 Units by mouth daily.   cyanocobalamin 1000 MCG  tablet Take 1,000 mcg by mouth daily.   doxycycline  (VIBRAMYCIN ) 100 MG capsule Take 1 capsule (100 mg total) by mouth 2 (two) times daily.   DULoxetine (CYMBALTA) 30 MG capsule Take 60 mg by mouth daily.   empagliflozin (JARDIANCE) 10 MG TABS tablet Take 10 mg by mouth daily.   gabapentin  (NEURONTIN ) 300 MG capsule Take 300 mg by mouth 2 (two) times daily.   glipiZIDE (GLUCOTROL) 5 MG tablet Take 2.5 mg by mouth daily before breakfast.   loratadine   (CLARITIN ) 10 MG tablet Take 10 mg by mouth daily.   losartan  (COZAAR ) 100 MG tablet Take 100 mg by mouth daily.   Menthol , Topical Analgesic, (BIOFREEZE) 4 % GEL Apply 1 application topically daily as needed (pain).   metFORMIN (GLUCOPHAGE) 500 MG tablet Take 1,000 mg by mouth 2 (two) times daily with a meal.   mupirocin  ointment (BACTROBAN ) 2 % Apply 1 Application topically 2 (two) times daily.   Omega-3 Fatty Acids (FISH OIL) 1200 MG CAPS Take 1,200 mg by mouth at bedtime.   omeprazole (PRILOSEC) 20 MG capsule Take 20 mg by mouth daily.   oxyCODONE  (OXY IR/ROXICODONE ) 5 MG immediate release tablet Take 5 mg by mouth every 6 (six) hours as needed for moderate pain (pain score 4-6) or severe pain (pain score 7-10).   OZEMPIC, 0.25 OR 0.5 MG/DOSE, 2 MG/3ML SOPN Inject 0.25 mg into the skin once a week.   pioglitazone  (ACTOS ) 45 MG tablet Take 45 mg by mouth daily.   propranolol (INDERAL) 10 MG tablet Take 10 mg by mouth daily.   traMADol  (ULTRAM ) 50 MG tablet Take 1-2 tablets (50-100 mg total) by mouth every 6 (six) hours as needed for moderate pain (If Vicodin alone ineffective).   trolamine salicylate (ASPERCREME) 10 % cream Apply 1 application topically as needed for muscle pain.     Allergies:   Lisinopril   Social History   Socioeconomic History   Marital status: Married    Spouse name: Not on file   Number of children: Not on file   Years of education: Not on file   Highest education level: Not on file  Occupational History   Not on file  Tobacco Use   Smoking status: Never   Smokeless tobacco: Never  Vaping Use   Vaping status: Never Used  Substance and Sexual Activity   Alcohol use: Yes    Comment: glass wine once a month   Drug use: No   Sexual activity: Not on file  Other Topics Concern   Not on file  Social History Narrative   Not on file   Social Drivers of Health   Financial Resource Strain: Not on file  Food Insecurity: Low Risk  (10/07/2023)   Received from  [facility]   Hunger Vital Sign    Within the past 12 months, you worried that your food would run out before you got money to buy more: Never true    Within the past 12 months, the food you bought just didn't last and you didn't have money to get more. : Never true  Transportation Needs: No Transportation Needs (10/07/2023)   Received from [facility]    In the past 12 months, has lack of reliable transportation kept you from medical appointments, meetings, work or from getting things needed for daily living? : No  Physical Activity: Not on file  Stress: Not on file  Social Connections: Not on file     Family History:  The patient's family history is not on file. ROS:   Please see the history of present illness.    All 14 point review of systems negative except as described per history of present illness  EKGs/Labs/Other Studies Reviewed:    EKG Interpretation Date/Time:  Friday January 24 2024 13:51:19 EDT Ventricular Rate:  76 PR Interval:  206 QRS Duration:  76 QT Interval:  332 QTC Calculation: 373 R Axis:   39  Text Interpretation: Normal sinus rhythm Anterior infarct , age undetermined Abnormal ECG When compared with ECG of 21-Aug-2018 13:43, Anterior infarct is now Present Nonspecific T wave abnormality now evident in Lateral leads Confirmed by Bernie Charleston [phone]) on 01/24/2024 1:55:57 PM    Recent Labs: 04/21/2023: ALT 16 08/12/2023: BUN 24; Creatinine, Ser 1.43; Hemoglobin 13.0; Platelets 181; Potassium 4.8; Sodium 134  Recent Lipid Panel    Component Value Date/Time   CHOL 128 04/12/2023 1145   TRIG 101 04/12/2023 1145   HDL 41 04/12/2023 1145   CHOLHDL 3.1 04/12/2023 1145   LDLCALC 68 04/12/2023 1145    Physical Exam:    VS:  BP 106/66   Pulse 76   Ht 5' 9 (1.753 m)   Wt 201 lb 6.4 oz (91.4 kg)   SpO2 94%   BMI 29.74 kg/m     Wt Readings from Last 3 Encounters:  01/24/24 201 lb 6.4 oz (91.4 kg)  08/01/23 172 lb 9.6 oz  (78.3 kg)  04/22/23 175 lb 6.4 oz (79.6 kg)     GEN:  Well nourished, well developed in no acute distress HEENT: Normal NECK: No JVD; No carotid bruits LYMPHATICS: No lymphadenopathy CARDIAC: RRR, no murmurs, no rubs, no gallops RESPIRATORY:  Clear to auscultation without rales, wheezing or rhonchi  ABDOMEN: Soft, non-tender, non-distended MUSCULOSKELETAL:  No edema; No deformity  SKIN: Warm and dry LOWER EXTREMITIES: no swelling NEUROLOGIC:  Alert and oriented x 3 PSYCHIATRIC:  Normal affect   ASSESSMENT:    1. Mixed hyperlipidemia   2. Elevated coronary artery calcium  score more than 2000   3. Type 2 diabetes mellitus with diabetic neuropathy, without long-term current use of insulin  (HCC)    PLAN:    In order of problems listed above:  Elevated calcium  score.  Asymptomatic last stress test reviewed from last year showing no evidence of ischemia, however EKG today shows some changes, will schedule him to have echocardiogram to see if he have any segmental wall motion abnormalities. Mixed dyslipidemia I did review [facility] which show me his LDL 68 HDL 41 we will continue present management which include high intensity statin and 40 mg Lipitor daily and antiplatelet therapy. Type 2 diabetes that being followed by internal medicine team, last hemoglobin A1c 7.5% which is elevated he understand he is trying to work on diet   Medication Adjustments/Labs and Tests Ordered: Current medicines are reviewed at length with the patient today.  Concerns regarding medicines are outlined above.  Orders Placed This Encounter  Procedures   EKG 12-Lead   Medication changes: No orders of the defined types were placed in this encounter.   Signed, [place] DOROTHA Bernie, MD, [facility] 01/24/2024 2:02 PM    [facility]

## 2024-01-24 NOTE — Patient Instructions (Signed)

## 2024-02-19 ENCOUNTER — Ambulatory Visit: Attending: Cardiology

## 2024-02-19 DIAGNOSIS — R0609 Other forms of dyspnea: Secondary | ICD-10-CM | POA: Insufficient documentation

## 2024-02-19 LAB — ECHOCARDIOGRAM COMPLETE
AR max vel: 2.53 cm2
AV Area VTI: 2.57 cm2
AV Area mean vel: 2.54 cm2
AV Mean grad: 6.7 mmHg
AV Peak grad: 12.8 mmHg
Ao pk vel: 1.79 m/s
Area-P 1/2: 3.5 cm2
S' Lateral: 3.1 cm

## 2024-02-20 ENCOUNTER — Ambulatory Visit: Payer: Self-pay | Admitting: Cardiology

## 2024-02-24 ENCOUNTER — Telehealth: Payer: Self-pay

## 2024-02-24 NOTE — Telephone Encounter (Signed)
 Echo Results reviewed with pt as per Dr. Vanetta Shawl note.  Pt verbalized understanding and had no additional questions. Routed to PCP

## 2024-02-27 DIAGNOSIS — E1142 Type 2 diabetes mellitus with diabetic polyneuropathy: Secondary | ICD-10-CM | POA: Diagnosis not present

## 2024-02-27 DIAGNOSIS — E1169 Type 2 diabetes mellitus with other specified complication: Secondary | ICD-10-CM | POA: Diagnosis not present

## 2024-02-27 DIAGNOSIS — N1831 Chronic kidney disease, stage 3a: Secondary | ICD-10-CM | POA: Diagnosis not present

## 2024-03-04 DIAGNOSIS — R269 Unspecified abnormalities of gait and mobility: Secondary | ICD-10-CM | POA: Diagnosis not present

## 2024-03-04 DIAGNOSIS — M6281 Muscle weakness (generalized): Secondary | ICD-10-CM | POA: Diagnosis not present

## 2024-03-04 DIAGNOSIS — M159 Polyosteoarthritis, unspecified: Secondary | ICD-10-CM | POA: Diagnosis not present

## 2024-03-05 DIAGNOSIS — R269 Unspecified abnormalities of gait and mobility: Secondary | ICD-10-CM | POA: Diagnosis not present

## 2024-03-05 DIAGNOSIS — M6281 Muscle weakness (generalized): Secondary | ICD-10-CM | POA: Diagnosis not present

## 2024-03-05 DIAGNOSIS — M159 Polyosteoarthritis, unspecified: Secondary | ICD-10-CM | POA: Diagnosis not present

## 2024-03-10 DIAGNOSIS — M47816 Spondylosis without myelopathy or radiculopathy, lumbar region: Secondary | ICD-10-CM | POA: Diagnosis not present
